# Patient Record
Sex: Male | Born: 1970 | Race: White | Hispanic: No | Smoking: Former smoker
Health system: Southern US, Community
[De-identification: ages and names within clinical notes are randomized; demographics above are authoritative.]

## PROBLEM LIST (undated history)

## (undated) DIAGNOSIS — M549 Dorsalgia, unspecified: Secondary | ICD-10-CM

## (undated) DIAGNOSIS — F32A Depression, unspecified: Secondary | ICD-10-CM

## (undated) DIAGNOSIS — M7660 Achilles tendinitis, unspecified leg: Secondary | ICD-10-CM

## (undated) DIAGNOSIS — F419 Anxiety disorder, unspecified: Secondary | ICD-10-CM

## (undated) DIAGNOSIS — F329 Major depressive disorder, single episode, unspecified: Secondary | ICD-10-CM

## (undated) DIAGNOSIS — S43006A Unspecified dislocation of unspecified shoulder joint, initial encounter: Secondary | ICD-10-CM

## (undated) DIAGNOSIS — E7800 Pure hypercholesterolemia, unspecified: Secondary | ICD-10-CM

## (undated) HISTORY — DX: Major depressive disorder, single episode, unspecified: F32.9

## (undated) HISTORY — PX: HERNIA REPAIR: SHX51

## (undated) HISTORY — PX: TONSILLECTOMY AND ADENOIDECTOMY: SUR1326

## (undated) HISTORY — PX: KNEE ARTHROSCOPY: SUR90

## (undated) HISTORY — DX: Depression, unspecified: F32.A

## (undated) HISTORY — DX: Anxiety disorder, unspecified: F41.9

---

## 1999-11-28 ENCOUNTER — Other Ambulatory Visit: Admission: RE | Admit: 1999-11-28 | Discharge: 1999-12-07 | Payer: Self-pay | Admitting: Psychiatry

## 2000-09-25 ENCOUNTER — Encounter: Payer: Self-pay | Admitting: Emergency Medicine

## 2000-09-25 ENCOUNTER — Emergency Department (HOSPITAL_COMMUNITY): Admission: EM | Admit: 2000-09-25 | Discharge: 2000-09-26 | Payer: Self-pay | Admitting: Emergency Medicine

## 2000-12-22 ENCOUNTER — Emergency Department (HOSPITAL_COMMUNITY): Admission: EM | Admit: 2000-12-22 | Discharge: 2000-12-22 | Payer: Self-pay | Admitting: Emergency Medicine

## 2000-12-22 ENCOUNTER — Encounter: Payer: Self-pay | Admitting: Emergency Medicine

## 2001-01-18 ENCOUNTER — Inpatient Hospital Stay (HOSPITAL_COMMUNITY): Admission: EM | Admit: 2001-01-18 | Discharge: 2001-01-25 | Payer: Self-pay | Admitting: Psychiatry

## 2001-03-07 ENCOUNTER — Inpatient Hospital Stay (HOSPITAL_COMMUNITY): Admission: EM | Admit: 2001-03-07 | Discharge: 2001-03-18 | Payer: Self-pay | Admitting: Psychiatry

## 2001-03-19 ENCOUNTER — Emergency Department (HOSPITAL_COMMUNITY): Admission: EM | Admit: 2001-03-19 | Discharge: 2001-03-19 | Payer: Self-pay | Admitting: *Deleted

## 2001-08-18 ENCOUNTER — Emergency Department (HOSPITAL_COMMUNITY): Admission: EM | Admit: 2001-08-18 | Discharge: 2001-08-18 | Payer: Self-pay | Admitting: *Deleted

## 2001-08-18 ENCOUNTER — Encounter: Payer: Self-pay | Admitting: Emergency Medicine

## 2002-02-13 ENCOUNTER — Emergency Department (HOSPITAL_COMMUNITY): Admission: EM | Admit: 2002-02-13 | Discharge: 2002-02-13 | Payer: Self-pay | Admitting: Emergency Medicine

## 2002-02-16 ENCOUNTER — Emergency Department (HOSPITAL_COMMUNITY): Admission: EM | Admit: 2002-02-16 | Discharge: 2002-02-16 | Payer: Self-pay | Admitting: Emergency Medicine

## 2002-07-19 ENCOUNTER — Inpatient Hospital Stay (HOSPITAL_COMMUNITY): Admission: EM | Admit: 2002-07-19 | Discharge: 2002-07-21 | Payer: Self-pay | Admitting: *Deleted

## 2002-08-23 ENCOUNTER — Emergency Department (HOSPITAL_COMMUNITY): Admission: EM | Admit: 2002-08-23 | Discharge: 2002-08-23 | Payer: Self-pay | Admitting: Emergency Medicine

## 2002-10-25 ENCOUNTER — Inpatient Hospital Stay (HOSPITAL_COMMUNITY): Admission: EM | Admit: 2002-10-25 | Discharge: 2002-10-29 | Payer: Self-pay | Admitting: Psychiatry

## 2002-10-25 ENCOUNTER — Encounter: Payer: Self-pay | Admitting: Emergency Medicine

## 2003-02-11 ENCOUNTER — Encounter: Admission: RE | Admit: 2003-02-11 | Discharge: 2003-02-11 | Payer: Self-pay | Admitting: Psychiatry

## 2003-02-20 ENCOUNTER — Inpatient Hospital Stay (HOSPITAL_COMMUNITY): Admission: EM | Admit: 2003-02-20 | Discharge: 2003-02-23 | Payer: Self-pay | Admitting: Psychiatry

## 2003-03-09 ENCOUNTER — Encounter: Admission: RE | Admit: 2003-03-09 | Discharge: 2003-03-09 | Payer: Self-pay | Admitting: Psychiatry

## 2003-06-28 ENCOUNTER — Emergency Department (HOSPITAL_COMMUNITY): Admission: EM | Admit: 2003-06-28 | Discharge: 2003-06-28 | Payer: Self-pay | Admitting: Emergency Medicine

## 2003-07-05 ENCOUNTER — Emergency Department (HOSPITAL_COMMUNITY): Admission: EM | Admit: 2003-07-05 | Discharge: 2003-07-05 | Payer: Self-pay | Admitting: Emergency Medicine

## 2003-07-09 ENCOUNTER — Emergency Department (HOSPITAL_COMMUNITY): Admission: EM | Admit: 2003-07-09 | Discharge: 2003-07-09 | Payer: Self-pay | Admitting: Emergency Medicine

## 2003-07-23 ENCOUNTER — Encounter: Admission: RE | Admit: 2003-07-23 | Discharge: 2003-07-23 | Payer: Self-pay | Admitting: Specialist

## 2003-07-24 ENCOUNTER — Encounter: Admission: RE | Admit: 2003-07-24 | Discharge: 2003-08-07 | Payer: Self-pay | Admitting: *Deleted

## 2003-08-06 ENCOUNTER — Emergency Department (HOSPITAL_COMMUNITY): Admission: EM | Admit: 2003-08-06 | Discharge: 2003-08-06 | Payer: Self-pay | Admitting: Emergency Medicine

## 2004-02-06 ENCOUNTER — Emergency Department (HOSPITAL_COMMUNITY): Admission: EM | Admit: 2004-02-06 | Discharge: 2004-02-06 | Payer: Self-pay | Admitting: Emergency Medicine

## 2004-03-09 ENCOUNTER — Ambulatory Visit (HOSPITAL_COMMUNITY): Payer: Self-pay | Admitting: Psychiatry

## 2004-05-02 ENCOUNTER — Ambulatory Visit (HOSPITAL_COMMUNITY): Payer: Self-pay | Admitting: Psychiatry

## 2004-08-01 ENCOUNTER — Ambulatory Visit (HOSPITAL_COMMUNITY): Payer: Self-pay | Admitting: Psychiatry

## 2004-08-06 ENCOUNTER — Emergency Department (HOSPITAL_COMMUNITY): Admission: EM | Admit: 2004-08-06 | Discharge: 2004-08-06 | Payer: Self-pay | Admitting: Emergency Medicine

## 2004-11-07 ENCOUNTER — Emergency Department (HOSPITAL_COMMUNITY): Admission: EM | Admit: 2004-11-07 | Discharge: 2004-11-08 | Payer: Self-pay | Admitting: Emergency Medicine

## 2005-01-17 ENCOUNTER — Emergency Department (HOSPITAL_COMMUNITY): Admission: EM | Admit: 2005-01-17 | Discharge: 2005-01-17 | Payer: Self-pay | Admitting: Emergency Medicine

## 2005-03-20 ENCOUNTER — Ambulatory Visit (HOSPITAL_COMMUNITY): Payer: Self-pay | Admitting: Psychiatry

## 2005-08-23 ENCOUNTER — Emergency Department (HOSPITAL_COMMUNITY): Admission: EM | Admit: 2005-08-23 | Discharge: 2005-08-23 | Payer: Self-pay | Admitting: Emergency Medicine

## 2005-11-03 ENCOUNTER — Emergency Department (HOSPITAL_COMMUNITY): Admission: EM | Admit: 2005-11-03 | Discharge: 2005-11-03 | Payer: Self-pay | Admitting: Emergency Medicine

## 2005-11-06 ENCOUNTER — Emergency Department (HOSPITAL_COMMUNITY): Admission: EM | Admit: 2005-11-06 | Discharge: 2005-11-06 | Payer: Self-pay | Admitting: Family Medicine

## 2005-11-07 ENCOUNTER — Emergency Department (HOSPITAL_COMMUNITY): Admission: EM | Admit: 2005-11-07 | Discharge: 2005-11-07 | Payer: Self-pay | Admitting: Emergency Medicine

## 2005-11-08 ENCOUNTER — Ambulatory Visit (HOSPITAL_COMMUNITY): Payer: Self-pay | Admitting: Psychiatry

## 2006-03-29 ENCOUNTER — Emergency Department (HOSPITAL_COMMUNITY): Admission: EM | Admit: 2006-03-29 | Discharge: 2006-03-29 | Payer: Self-pay | Admitting: Emergency Medicine

## 2006-10-04 ENCOUNTER — Emergency Department (HOSPITAL_COMMUNITY): Admission: EM | Admit: 2006-10-04 | Discharge: 2006-10-04 | Payer: Self-pay | Admitting: Emergency Medicine

## 2006-10-05 ENCOUNTER — Emergency Department (HOSPITAL_COMMUNITY): Admission: EM | Admit: 2006-10-05 | Discharge: 2006-10-05 | Payer: Self-pay | Admitting: Emergency Medicine

## 2007-02-09 ENCOUNTER — Emergency Department (HOSPITAL_COMMUNITY): Admission: EM | Admit: 2007-02-09 | Discharge: 2007-02-09 | Payer: Self-pay | Admitting: Emergency Medicine

## 2007-06-20 ENCOUNTER — Emergency Department (HOSPITAL_COMMUNITY): Admission: EM | Admit: 2007-06-20 | Discharge: 2007-06-20 | Payer: Self-pay | Admitting: Emergency Medicine

## 2007-07-01 ENCOUNTER — Ambulatory Visit (HOSPITAL_COMMUNITY): Admission: RE | Admit: 2007-07-01 | Discharge: 2007-07-01 | Payer: Self-pay | Admitting: *Deleted

## 2007-07-31 ENCOUNTER — Ambulatory Visit (HOSPITAL_BASED_OUTPATIENT_CLINIC_OR_DEPARTMENT_OTHER): Admission: RE | Admit: 2007-07-31 | Discharge: 2007-07-31 | Payer: Self-pay | Admitting: Orthopedic Surgery

## 2007-08-15 ENCOUNTER — Encounter: Admission: RE | Admit: 2007-08-15 | Discharge: 2007-09-24 | Payer: Self-pay | Admitting: Orthopedic Surgery

## 2007-08-19 ENCOUNTER — Emergency Department (HOSPITAL_COMMUNITY): Admission: EM | Admit: 2007-08-19 | Discharge: 2007-08-19 | Payer: Self-pay | Admitting: Emergency Medicine

## 2007-08-28 ENCOUNTER — Emergency Department (HOSPITAL_COMMUNITY): Admission: EM | Admit: 2007-08-28 | Discharge: 2007-08-28 | Payer: Self-pay | Admitting: Emergency Medicine

## 2007-10-19 ENCOUNTER — Emergency Department (HOSPITAL_COMMUNITY): Admission: EM | Admit: 2007-10-19 | Discharge: 2007-10-19 | Payer: Self-pay | Admitting: Emergency Medicine

## 2007-10-25 ENCOUNTER — Emergency Department (HOSPITAL_COMMUNITY): Admission: EM | Admit: 2007-10-25 | Discharge: 2007-10-25 | Payer: Self-pay | Admitting: Emergency Medicine

## 2007-10-31 ENCOUNTER — Emergency Department (HOSPITAL_COMMUNITY): Admission: EM | Admit: 2007-10-31 | Discharge: 2007-10-31 | Payer: Self-pay | Admitting: Emergency Medicine

## 2007-11-02 ENCOUNTER — Emergency Department (HOSPITAL_COMMUNITY): Admission: EM | Admit: 2007-11-02 | Discharge: 2007-11-02 | Payer: Self-pay | Admitting: Emergency Medicine

## 2007-11-06 ENCOUNTER — Ambulatory Visit: Payer: Self-pay | Admitting: Family Medicine

## 2007-11-25 ENCOUNTER — Encounter
Admission: RE | Admit: 2007-11-25 | Discharge: 2007-11-26 | Payer: Self-pay | Admitting: Physical Medicine & Rehabilitation

## 2007-11-26 ENCOUNTER — Ambulatory Visit: Payer: Self-pay | Admitting: Physical Medicine & Rehabilitation

## 2008-03-03 ENCOUNTER — Emergency Department (HOSPITAL_COMMUNITY): Admission: EM | Admit: 2008-03-03 | Discharge: 2008-03-03 | Payer: Self-pay | Admitting: Emergency Medicine

## 2008-05-03 ENCOUNTER — Emergency Department (HOSPITAL_COMMUNITY): Admission: EM | Admit: 2008-05-03 | Discharge: 2008-05-03 | Payer: Self-pay | Admitting: Emergency Medicine

## 2008-05-28 ENCOUNTER — Emergency Department (HOSPITAL_COMMUNITY): Admission: EM | Admit: 2008-05-28 | Discharge: 2008-05-28 | Payer: Self-pay | Admitting: Emergency Medicine

## 2008-06-23 ENCOUNTER — Emergency Department (HOSPITAL_COMMUNITY): Admission: EM | Admit: 2008-06-23 | Discharge: 2008-06-23 | Payer: Self-pay | Admitting: Emergency Medicine

## 2009-01-07 ENCOUNTER — Emergency Department (HOSPITAL_COMMUNITY): Admission: EM | Admit: 2009-01-07 | Discharge: 2009-01-07 | Payer: Self-pay | Admitting: Emergency Medicine

## 2009-11-16 ENCOUNTER — Emergency Department (HOSPITAL_COMMUNITY): Admission: EM | Admit: 2009-11-16 | Discharge: 2009-11-16 | Payer: Self-pay | Admitting: Emergency Medicine

## 2010-02-14 ENCOUNTER — Emergency Department (HOSPITAL_COMMUNITY): Admission: EM | Admit: 2010-02-14 | Discharge: 2010-02-14 | Payer: Self-pay | Admitting: Emergency Medicine

## 2010-04-05 ENCOUNTER — Emergency Department (HOSPITAL_COMMUNITY): Admission: EM | Admit: 2010-04-05 | Discharge: 2010-04-05 | Payer: Self-pay | Admitting: Emergency Medicine

## 2010-07-20 ENCOUNTER — Emergency Department (HOSPITAL_COMMUNITY)
Admission: EM | Admit: 2010-07-20 | Discharge: 2010-07-20 | Payer: Self-pay | Source: Home / Self Care | Admitting: Emergency Medicine

## 2010-09-08 LAB — DIFFERENTIAL
Basophils Absolute: 0 10*3/uL (ref 0.0–0.1)
Basophils Relative: 0 % (ref 0–1)
Eosinophils Absolute: 0.1 10*3/uL (ref 0.0–0.7)
Eosinophils Relative: 1 % (ref 0–5)
Lymphocytes Relative: 22 % (ref 12–46)
Lymphs Abs: 2.1 10*3/uL (ref 0.7–4.0)
Monocytes Absolute: 0.6 10*3/uL (ref 0.1–1.0)
Monocytes Relative: 6 % (ref 3–12)
Neutro Abs: 6.8 10*3/uL (ref 1.7–7.7)
Neutrophils Relative %: 71 % (ref 43–77)

## 2010-09-08 LAB — COMPREHENSIVE METABOLIC PANEL
Albumin: 4.5 g/dL (ref 3.5–5.2)
Alkaline Phosphatase: 61 U/L (ref 39–117)
BUN: 11 mg/dL (ref 6–23)
CO2: 28 mEq/L (ref 19–32)
Chloride: 109 mEq/L (ref 96–112)
Creatinine, Ser: 0.96 mg/dL (ref 0.4–1.5)
GFR calc non Af Amer: >60 mL/min (ref >60)
Glucose, Bld: 107 mg/dL — ABNORMAL HIGH (ref 70–99)
Total Bilirubin: 0.8 mg/dL (ref 0.3–1.2)

## 2010-09-08 LAB — CBC
HCT: 46.7 % (ref 39.0–52.0)
Hemoglobin: 16.1 g/dL (ref 13.0–17.0)
MCH: 32.8 pg (ref 26.0–34.0)
MCHC: 34.5 g/dL (ref 30.0–36.0)
MCV: 95 fL (ref 78.0–100.0)
Platelets: 256 10*3/uL (ref 150–400)
RBC: 4.91 MIL/uL (ref 4.22–5.81)
RDW: 13.5 % (ref 11.5–15.5)
WBC: 9.6 10*3/uL (ref 4.0–10.5)

## 2010-09-08 LAB — URINALYSIS, ROUTINE W REFLEX MICROSCOPIC
Bilirubin Urine: NEGATIVE
Glucose, UA: NEGATIVE mg/dL
Hgb urine dipstick: NEGATIVE
Protein, ur: NEGATIVE mg/dL
Specific Gravity, Urine: 1.021 (ref 1.005–1.030)
Urobilinogen, UA: 0.2 mg/dL (ref 0.0–1.0)

## 2010-09-08 LAB — LIPASE, BLOOD: Lipase: 22 U/L (ref 11–59)

## 2010-10-12 ENCOUNTER — Emergency Department (HOSPITAL_COMMUNITY): Payer: Medicaid Other

## 2010-10-12 ENCOUNTER — Emergency Department (HOSPITAL_COMMUNITY)
Admission: EM | Admit: 2010-10-12 | Discharge: 2010-10-12 | Disposition: A | Discharge status: Home / Self Care | Payer: Medicaid Other | Attending: Emergency Medicine | Admitting: Emergency Medicine

## 2010-10-12 DIAGNOSIS — M546 Pain in thoracic spine: Secondary | ICD-10-CM | POA: Insufficient documentation

## 2010-10-12 DIAGNOSIS — IMO0001 Reserved for inherently not codable concepts without codable children: Secondary | ICD-10-CM | POA: Insufficient documentation

## 2010-10-12 DIAGNOSIS — M25519 Pain in unspecified shoulder: Secondary | ICD-10-CM | POA: Insufficient documentation

## 2010-11-08 NOTE — Group Therapy Note (Signed)
REASON FOR EVALUATION:  Right knee pain.  Has postoperative pain.   Date of surgery was July 31, 2007, per Dr. Turner Daniels.  He had  chondromalacia, lateral femoral condyle as well as complex anterior horn  medial meniscal tear, status post debridement.  He had no postoperative  complications; however, continues to have complaints of knee locking in  an extended position.  He can flex to at least 90.  He complains of pain  now that he is no longer taking narcotic analgesics.  He received  Percocet initially postoperatively, switched to Vicodin.  He went to a  couple of physical therapy sessions but discontinued.  He indicates some  financial concerns regarding this.  The patient is concerned that the  weightbearing surface is no longer covered by cartilage.  He has not  done any kind of exercise other than trying to do some straight leg  raising.  He states that he went back to work for a time when he was on  medications but has not worked since being out of his narcotic analgesic  medicines over the last two weeks.  He did go to [employer] and  received tramadol through their office.  He reports going to the ED on  one occasion for postoperative pain, but they stated they could not see  him on an ongoing basis.   Reviewing MRI reports showed that some lateral patellar subluxation was  reported, some edema in the lateral aspect of the trochlea, but patellar  cartilage intact.  No tear in the medial patellar retinaculum.   He does complain of the knee popping out at times, but he cannot really  describe or show me what that exactly means.   SOCIAL HISTORY:  Divorced.  Lives with his mother.  Social alcohol.  Smokes a pack a day.  Does not admit to any type of illegal drugs.  In  looking at his E-chart, multiple ED visits for right knee pain.  Recently some oxycodone in April, on May 1.   His functional status is independent.  He states that he has not worked,  though, in four  weeks.   PHYSICAL EXAMINATION:  Blood pressure 131/68, pulse 73, respiratory rate  18, O2 sat 97% on room air.  Orientation x3.  Affect is irritable.  Gait is normal.  He has normal  hip exam, normal ankle exam, normal left knee exam with the exception of  some crepitus at the patella on the left.  On the right knee, very  difficult to examine, guarding.  Does not want me to touch his knee.  Even checking his deep tendon reflexes, he jumped and he states that it  hurt.  He has no evidence of effusion, no evidence of erythema.  No  joint line tenderness.  No tenderness around the patella.  He has full  extension.  Flexion, he gets to at least 90, but he would not let me  check him beyond this point.  He does have good medial lateral  stability.   IMPRESSION:  Chronic postoperative pain, status post meniscectomy:  I  did take out the knee model and explained what was resected and  indicated that even 20 years ago when people did total meniscectomies,  patients did not have immediate postoperative pain that persisted, and  in fact, patient had only a partial debridement.  I indicated the need  for strengthening the leg and have asked him to use the stationary  bicycle.   In regards  to his knee locking or his knee popping out, I questioned  whether he may have some patellar subluxation.  He does have reportedly  shallow trochlea.  He may benefit from orthopedic re-evaluation in  regards to this.   I do not see why he cannot go to work, although he indicates that he can  only work if he had narcotic analgesics.  We will check urine drug  screen on him.  Do not think long-term narcotics would really be  indicated but perhaps short term to get at least into some physical  therapy would be helpful.  I do question his motivation, as he states  that he does not really feel like he needs therapy and his knee just  needs to be fixed right.   Thank you for this interesting  consultation.      Erick Colace, M.D.  Electronically Signed     AEK/MedQ  D:  11/26/2007 15:53:56  T:  11/26/2007 16:17:04  Job #:  [id number]

## 2010-11-08 NOTE — Op Note (Signed)
Kevin Hall, Kevin Hall               ACCOUNT NO.:  [id number]   MEDICAL RECORD NO.:  [id number]          PATIENT TYPE:  AMB   LOCATION:  DSC                          FACILITY:  [facility]   PHYSICIAN:  Feliberto Gottron. Turner Daniels, M.D.   DATE OF BIRTH:  1970-09-17   DATE OF PROCEDURE:  07/31/2007  DATE OF DISCHARGE:                               OPERATIVE REPORT   PREOPERATIVE DIAGNOSIS:  Right knee lateral meniscal tear, anterior  horn, and possible osteochondral defect of lateral femoral condyle.   POSTOPERATIVE DIAGNOSIS:  Right knee lateral meniscal tear, anterior  horn, and possible osteochondral defect of lateral femoral condyle.   PROCEDURE:  Right knee arthroscopic partial lateral meniscectomy,  anterior horn, and removal of grade 4 flap tear, lateral femoral condyle  anterior to anterodistal on the lateral side.  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   SURGEON:  Feliberto Gottron. Turner Daniels, M.D.   FIRST ASSISTANT:  Skip Mayer, P.A.-C.   ANESTHESIA:  General LMA.   ESTIMATED BLOOD LOSS:  Minimal.   FLUID REPLACEMENT:  A liter of crystalloid.   DRAINS PLACED:  None.   TOURNIQUET TIME:  None.   INDICATIONS FOR PROCEDURE:  This is a 40 year old man who fell off of a  ladder and injured his right knee back around Christmastime.  Has had  persistent pain, catching and popping in his right knee since the injury  and desires elective arthroscopic evaluation and treatment of his knee.  An MRI scan showed an anterior horn lateral meniscal tear and a possible  osteochondral defect, lateral femoral condyle.  Risks and benefits of  surgery discussed, questions answered.   DESCRIPTION OF PROCEDURE:  The patient identified by armband, taken to  the operating room at [facility].  Appropriate anesthetic  monitors were attached and general LMA anesthesia induced with the  patient in supine  position.  Lateral post applied to the table and the  right lower extremity prepped and draped in the usual sterile fashion  from the ankle to the midthigh.   Using a #11 blade, standard inferomedial and inferolateral peripatellar  portals were then made allowing introduction of the arthroscope through  the inferolateral portal and the outflow through the inferomedial  portal.  Diagnostic arthroscopy revealed what appeared to be some  distant chondromalacia of the medial facet of the patella along the  medial edge consistent with a lateral subluxation of the patella,  although it was not a fresh injury.  The age looked to indeterminate  since it had already healed over with some scar cartilage.  The trochlea  was in relatively good condition as was the medial compartment,  articular and meniscal cartilages.  The cruciate ligaments were intact,  although there was a slight amount of fraying of the ACL that was  lightly debrided.  The anterior horn of the lateral meniscus was in fact  torn, with some complex tearing, and this was debrided back to a stable  margin using a 3.5 gator sucker  shaver.  There was some grade 3  chondromalacia of the lateral tibial plateau.  This was also debrided.  We then directed our attention to the far lateral aspect of the lateral  femoral condyle where there was delamination of the cartilage from the  lateral femoral condyle at the junction of the lateral femoral condyle  and the lateral wall of the femur.  This was about a quarter inch where  was it flap torn and delaminated.  This was debrided back to a stable  margin using a 3.5 gator sucker shaver as well as a 4.2 barracuda sucker  shaver.  Once we got back to a stable margin, we then evaluated the  gutters medially and laterally and cleared the posterior horns of the  menisci by going through the notch.  The knee was then irrigated out  with normal saline solution.  The arthroscopic instruments were  removed,  and a dressing of Xerofoam, 4 x 4 dressing, sponges, Webril, and Ace  wrap were applied.   The patient was then awakened and taken to the recovery room without  difficulty.      Feliberto Gottron. Turner Daniels, M.D.  Electronically Signed     FJR/MEDQ  D:  07/31/2007  T:  08/01/2007  Job:  [id number]

## 2010-11-08 NOTE — Group Therapy Note (Signed)
ADDENDUM:  Review of electronic medical records reveals multiple  psychiatric hospitalizations from 2002 to 2004.  There were four  episodes with diagnosis of bipolar disorder, manic, rule out schizo-  affective disorder.  He was treated with Risperdal at one point.  Also  noted to have alcohol abuse and marijuana abuse at that time and poor  compliance with lithium.   This makes him a poor candidate for long-term narcotic analgesic  medication.      Erick Colace, M.D.  Electronically Signed     AEK/MedQ  D:  11/26/2007 16:08:31  T:  11/26/2007 16:54:39  Job #:  [id number]   cc:   Feliberto Gottron. Turner Daniels, M.D.  Fax: [phone]

## 2010-11-11 NOTE — Discharge Summary (Signed)
[facility]  Patient:    Kevin Hall, Kevin Hall Visit Number: [id number] MRN: [id number]          Service Type: PSY Location: 50 0503 01 Attending Physician:  Samule Dry Dictated by:   Adelene Amas. Williford, M.D. Admit Date:  03/07/2001 Discharge Date: 03/18/2001                             Discharge Summary  GENERAL MEDICAL FINDINGS:  The patients lithium level on 20 September was 0.97.  His basic metabolic panel was unremarkable.  The patient had some initial subjective report of slight rigidity from the Risperdal.  Cogentin was available p.r.n.; however, the patient reported no rigidity; there was no evidence neurologically in subsequent days, and the Cogentin was not needed.  HOSPITAL COURSE:  Kevin Hall was admitted to the inpatient psychiatric unit on commitment, and when he became cooperative, began participating in milieu and group psychotherapy as well as one-to-one ego supportive psychotherapy.  By September 15, he began voluntarily taking his medication including lithium 900 mg q.h.s., Risperdal 1 mg q.h.s.  By September 22, [name] had slept 7 hours restful sleep.  His mood was stable.  His nurses were noting that he had been cooperative and socially appropriate.  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  His appetite and energy were within normal limits.  He was not experiencing any adverse medication effects.  He was not having any paranoia or other delusions.  His racing thoughts had resolved.  CONDITION UPON DISCHARGE:  By September 23, Kevin Hall was continuing to be cooperative and socially appropriate with staff and other patients.  His mood, interests, energy, concentration, and appetite were all within normal limits. He was not experiencing any abnormal voluntary movements or other adverse medication effects.  He was not having any racing thoughts, hallucinations, or delusions.  The patient had made  arrangements to stay in a hotel.  His family had stated that he could not return home to live with them.  The patient stated that he did not want the undersigned or anyone from the hospital to contact his family.  The patient stated that he would stay in a hotel for a few days until he could make other arrangements.  He was also aware of the [facility] as an option for him.  Vital signs upon discharge: Temperature 97.1, pulse 99, respirations 16, blood pressure sitting 125/65, standing 135/64.  Weight had improved from 124 pounds on September 12 to 134 pounds on the day of discharge.  MENTAL STATUS EXAMINATION UPON DISCHARGE:  Kevin Hall is alert.  He is oriented to all spheres.  His speech is within normal limits.  Thought process is logical, coherent, goal directed, no looseness of association in thought content, no thoughts of harming himself, no thoughts of harming others, no delusions, no hallucinations.  Memory within normal limits.  Concentration within normal limits.  Affect broad, appropriate.  Judgment within normal limits.  DISCHARGE DIAGNOSES: AXIS I.   Bipolar I disorder in early clinical remission. AXIS II.  Deferred. AXIS III. History of right inguinal hernia repair with no residual           sequelae. AXIS IV.  Occupational primary support group. AXIS V.   60.  DISCHARGE PLAN:  Kevin Hall agrees to not drive if drowsy and to call for thoughts of harming himself, thoughts of harming others, distress, racing  thoughts.  DISCHARGE MEDICATIONS: 1. Risperdal 1 mg 1 p.o. q.h.s., dispense #15. 2. Lithobid 300 mg 3 p.o. q.h.s., dispense #30.  The patient was given directions to the [facility] with the address and the phone number.  He has an appointment with the undersigned for psychiatric followup at 11:45 on September 24. Dictated by:   Adelene Amas. Williford, M.D. Attending Physician:  Samule Dry DD:  03/18/01 TD:  03/18/01 Job:  [id number]

## 2010-11-11 NOTE — H&P (Signed)
NAME:  Kevin Hall, Kevin Hall                         ACCOUNT NO.:  [id number]   MEDICAL RECORD NO.:  [id number]                   PATIENT TYPE:  OBV   LOCATION:  0505                                 [facility]ILITY:  [facility]   PHYSICIAN:  Kevin Hall, M.D.                   DATE OF BIRTH:  December 05, 1970   DATE OF ADMISSION:  07/18/2002  DATE OF DISCHARGE:                         PSYCHIATRIC ADMISSION ASSESSMENT   PATIENT IDENTIFICATION:  This is a 40 year old white male who is divorced  with no children.  He is voluntarily admitted.   HISTORY OF PRESENT ILLNESS:  The patient walked into the assessment area of  [facility] reporting feeling depressed.  He acknowledged being  treated as an outpatient with Kevin Hall, M.D. for bipolar disorder.  He acknowledged having gone of his medications 7-10 days ago so that he  could drink more.  The patient appeared to be somewhat manic and somewhat  confused.  He also appeared intoxicated with tangential thoughts.  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  He was  requesting admission.  Although he specifically denied suicidal ideation,  the patient said he cut his hand and wrist.  Today, after getting some  sleep, he reported that he accidentally cut his hand paring a tomato.   PAST PSYCHIATRIC HISTORY:  He acknowledges two prior admissions here at  [facility] and a one day admission to [facility].   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  As he has been playing music every day, he  has been drinking beer every day.   PAST MEDICAL HISTORY:  Primary care Kevin Hall: He does not have a primary  care Kevin Hall.  He does acknowledge a recent physical [facility]  [address] prior to his departure from his employment at [employer].   MEDICATIONS:  He states he is only taking lithium 900 mg at hour of  sleep,  that he and Kevin Hall, M.D., allowed him to discontinue Risperdal a  while ago.  He has an upcoming appointment on July 22, 2002.   DRUG ALLERGIES:  None.   REVIEW OF SYSTEMS:  His review of systems was unremarkable.   PHYSICAL EXAMINATION:  GENERAL:  Positive physical findings were none.  He  is a healthy, well developed, well nourished white male who appears his  stated age of 32.   SOCIAL HISTORY:  He finished the 11th grade.  He has worked the past four to  five years at [employer], although he was made to quit a few weeks ago,  according to him.   FAMILY HISTORY:  His brother, age 79, also takes lithium for bipolar  disorder.  His mother is depressed.  He is unaware of what medication she  takes.   MENTAL STATUS EXAM:  Mental status exam revealed a healthy, alert, young  man.  His appearance and behavior were notable for poor eye contact as well  as impulsive and hyper movements.  His speech was rapid, almost pressured.  His mood was irritable, although he called this being anxious.  He did not  appear to read social cues correctly.  His thought process was logical and  goal directed.  His cognition was intact.   ADMISSION DIAGNOSES:   AXIS I:  Bipolar, hypomanic.   AXIS II:  Deferred.   AXIS III:  No known medical problems.   AXIS IV:  He has multiple problems, problems with primary support group,  problems related to social environment, occupational problems, housing  problems, and economic problems.   AXIS V:  He is currently 29.  In the past year, he was probably 65 or 69.   INITIAL PLAN OF CARE:  The plan is to convert his 23-hour observation to a  full admission, to restart his medications include Risperdal, and to have a  family session to help clarify whether the patient should try to work as he  is actively applying for disability to think about Strattera to help  inattention and hyperimpulsive symptoms.   ESTIMATED LENGTH OF STAY:  Tentative  length of stay is two to three days.     Kevin Hall, P.A.-C.              Kevin Hall, M.D.    MA/MEDQ  D:  07/19/2002  T:  07/19/2002  Job:  [id number]
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
## 2010-11-11 NOTE — Discharge Summary (Signed)
NAME:  Kevin Hall, Kevin Hall                         ACCOUNT NO.:  [id number]   MEDICAL RECORD NO.:  [id number]                   PATIENT TYPE:  IPS   LOCATION:  0508                                 [facility]ILITY[facility]   PHYSICIAN:  Carolanne Grumbling, M.D.                 DATE OF BIRTH:  1971/01/05   DATE OF ADMISSION:  02/20/2003  DATE OF DISCHARGE:  02/23/2003                                 DISCHARGE SUMMARY   HISTORY OF PRESENT ILLNESS:  [name] was a 40 year old man who was  admitted to the hospital  after having a history of depression with  increased  anxiety. He had had trouble sleeping. He has a history of bipolar  disorder and had stopped taking his  lithium for about 3 months prior to  admission. He had changed doctors and his new doctor started him on a new  medication but he stopped that medication on his own. He had been reportedly  drinking  to control some of his symptoms. He has had several admissions to  this center before. He also admitted to smoking marijuana.   Mental status at the time of the initial evaluation  revealed  an alert and  oriented man who was hyperverbal, loud, angry, upset, irritable. He was  focused on getting his Concerta and his medications immediately. Otherwise  cognitive functioning was intact. The judgment and insight were poor.   ADMISSION DIAGNOSES:   AXIS I:  1. Bipolar disorder mixed.  2. Alcohol abuse.   AXIS II:  Deferred.   AXIS III:  No diagnosis.   AXIS IV:  Preoccupation with psychosocial problems.   AXIS V:  30/55.   PHYSICAL EXAMINATION:  Noncontributory.   LABORATORY DATA:  Findings of all indicated laboratory examinations were  within normal limits except for a slightly elevated blood sugar at 155, the  presence of marijuana in his urine drug screen and his lithium level was  less than 0.25.   HOSPITAL COURSE:  While in the hospital he mainly was irritable. He wanted  to take the medications the way he wanted to take them  and when he  wanted  to take them. He was impatient to follow through with an observation. He did  take the lithium which he has taken in the past. By the time I saw him on  the day of discharge  he was ready to be released, saying that he would only  get worse in the hospital. He would to better  at home.   I talked to his mother. She was willing to have him home, although she was  uneasy about it because he stopped his medications so many times in the  past. He indicated that this time he realized he needed to take the lithium  and he would not stop. At no point during any of the hospital stay was he  suicidal or making  threats towards others and consequently he was  discharged.   DISCHARGE MEDICATIONS:  1. Lithium carbonate 450 mg b.i.d.  2. Risperdal 0.5 mg b.i.d.  3. Clonazepam 0.5 mg b.i.d. and 1 mg q. h.s.  4. Concerta 54 mg daily.   DISCHARGE INSTRUCTIONS:  There were no restrictions placed on his activity  or diet. He was to have  a lithium level  drawn on March 03, 2003.   FOLLOW UP:  He was to follow up with Dr. Mariana Single at the outpatient clinic  at this hospital.                                               Carolanne Grumbling, M.D.    GT/MEDQ  D:  03/09/2003  T:  03/10/2003  Job:  [id number]

## 2010-11-11 NOTE — Discharge Summary (Signed)
NAME:  Kevin Hall, Kevin Hall                         ACCOUNT NO.:  [id number]   MEDICAL RECORD NO.:  [id number]                   PATIENT TYPE:  IPS   LOCATION:  0301                                 [facility]ACILITY[facility]  [facility]   PHYSICIAN:  Geoffery Lyons, M.D.                   DATE OF BIRTH:  03/25/1971   DATE OF ADMISSION:  10/25/2002  DATE OF DISCHARGE:  10/29/2002                                 DISCHARGE SUMMARY   CHIEF COMPLAINT AND PRESENT ILLNESS:  This was the fourth admission to [facility] for this male who presented with the complaint of  thoughts that he wanted to strangle himself with his necklace.  History of  attempting to strangle himself.  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  He  was prescribed Klonopin, unclear whether he was actually abusing the  Klonopin as well.  Also acknowledged that he smokes marijuana.  Complained  of abdominal pain.   PAST PSYCHIATRIC HISTORY:  First time at [facility].  He  had been at [facility] and also sees Dr. Elam Dutch on an  outpatient basis.  Prescribed Eskalith 900 mg daily and Klonopin 1 mg twice  a day.   ALCOHOL/DRUG HISTORY:  Occasional use of marijuana.  He does endorse the use  of alcohol.   PAST MEDICAL HISTORY:  Noncontributory.   PHYSICAL EXAMINATION:  Performed and failed to show any acute findings.   MENTAL STATUS EXAM:  Alert, cooperative male.  Anxious, irritable.  Affect  is somewhat expansive, cooperative, oriented.  Good eye contact.  Speech is  rapid, pressured, tight association, oriented to person, place and time.  Increased psychomotor presentation.  Thoughts are rational, coherent.  No  delusions.  No auditory or visual hallucinations.  Cognition well-preserved.   ADMISSION DIAGNOSES:   AXIS I:  1. Bipolar disorder, hypomanic.  2. Alcohol and marijuana abuse.   AXIS II:  No diagnosis.   AXIS III:  No diagnosis.   AXIS IV:  Moderate.   AXIS V:  Global Assessment of Functioning upon admission 35; highest Global  Assessment of Functioning in the last year 60-65.   LABORATORY DATA:  CBC within normal limits.  Blood chemistries within normal  limits.  Thyroid profile within normal limits.   HOSPITAL COURSE:  He was admitted and started intensive individual and group  psychotherapy.  He did admit he stopped his lithium.  He was having pain.  Did admit he was under a lot of stress.  Feeling irritable, angry, agitated.  Did feel that Risperdal helped in the past.  He continued to have some  somatic complaints.  Evidencing restlessness, pressured speech but started  to sleep better.  Lithium was reinstituted.  Lithium level was 0.62 and it  was increased to 225 mg daily.  By Oct 29, 2002, he was much improved.  He  was tolerating medications well.  He was on Risperdal 0.5 mg twice a day.  Eskalith CR 450 mg in the morning and 675 mg in the afternoon.  Klonopin 1  mg twice a day.  On Oct 29, 2002, he was much improved.  Slept through the  night.  Mood starting to become euthymic.  Affect was bright, broad.  Not  expansive.  Marked decrease in pressured speech.  Increased insight.  No  suicidal ideation.  No homicidal ideation.  No paranoia.  Family session  with the father went well.  So we went ahead and discharged to outpatient  follow-up.   DISCHARGE DIAGNOSES:   AXIS I:  1. Bipolar disorder, hypomanic.  2. Alcohol and marijuana abuse.   AXIS II:  No diagnosis.   AXIS III:  No diagnosis.   AXIS IV:  Moderate.   AXIS V:  Global Assessment of Functioning upon discharge 50.   DISCHARGE MEDICATIONS:  1. Protonix 40 mg per day.  2. Colace 100 mg twice a day.  3. Eskalith CR 450 mg, 1 in the morning and 1-1/2 at night.  4. Risperdal 0.5 mg twice a day.  5. Klonopin 1 mg twice a day.   FOLLOW UP:  Going to see Dr. Jeanie Sewer for follow-up.                                                Geoffery Lyons, M.D.    IL/MEDQ  D:  11/26/2002  T:  11/27/2002  Job:  [id number]
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
## 2010-11-11 NOTE — H&P (Signed)
NAME:  Kevin Hall, Kevin Hall                         ACCOUNT NO.:  [id number]   MEDICAL RECORD NO.:  [id number]                   PATIENT TYPE:  IPS   LOCATION:  0508                                 FACILITY[facility]  [facility]   PHYSICIAN:  Geoffery Lyons, M.D.                   DATE OF BIRTH:  September 20, 1970   DATE OF ADMISSION:  02/20/2003  DATE OF DISCHARGE:  02/23/2003                         PSYCHIATRIC ADMISSION ASSESSMENT   IDENTIFYING INFORMATION:  The patient is a 40 year old divorced white male  voluntarily admitted on February 20, 2003.   HISTORY OF PRESENT ILLNESS:  The patient presents with a history of  depression and having increased anxiety.  He reports he has been having  difficulty sleeping.  He has been off his medication, his lithium, for some  time because he states he has been working out in the yard and he knew he  was not supposed to be taking his lithium remaining outside in the sun for  some time.  He was started on a new mood stabilizer but states when he read  up on it and realized it was for seizures, he decided not to take it.  He  reports he has begun drinking some to control some of his symptoms but  denies that this is a problem.  He denies any psychotic symptoms.   PAST PSYCHIATRIC HISTORY:  He has had several admissions to [facility].  He was here in May 2004 and [place] for one day.  He was  seeing [name], M.D., and then Hipolito Bayley, M.D., and then did  see Dr. Marlyne Beards briefly and was started on Trileptal at that time.   SUBSTANCE ABUSE HISTORY:  The patient smokes cigarettes.  He uses alcohol on  occasion, denies it is a problem.  He smokes marijuana.   PAST MEDICAL HISTORY:  Primary care Anisha Starliper: Unknown.  Medical problems:  None.   MEDICATIONS:  1. He has been on lithium 450 mg in the morning, 600 mg in the evening.  2. Klonopin, taking 2 mg daily.  3. Concerta 54 mg q.a.m.  4. Risperdal 0.5 mg b.i.d.   He has been noncompliant with  his lithium and Risperdal.   DRUG ALLERGIES:  No known allergies.   REVIEW OF SYSTEMS:  No cardiac or pulmonary problems.  The patient smokes  cigarettes.  No GI or GU.  Not sexually active at this time.  He complains  of some lower back pain that he relates to stress.   PHYSICAL EXAMINATION:  GENERAL:  The patient is a young, well nourished male  voicing no complaints.  VITAL SIGNS:  Temperature 97.9, heart rate 72, respiratory rate 16, blood  pressure 132/81.  He is 5 feet 8 inches tall.  He weighs 146 pounds.  HEENT:  Hair is short, clean, and evenly distributed.  Head is  normocephalic, atraumatic.  NECK:  Negative lymphadenopathy.  CHEST:  Clear to auscultation.  HEART:  Regular rate and rhythm.  ABDOMEN:  Soft, nontender abdomen.  MUSCULOSKELETAL:  Muscle strength and tone is equal bilaterally.  No CVA  tenderness, no spinal tenderness.  NEUROLOGIC:  He is able to performed heel-to-shin and normal alternating  movements without any difficulty.   LABORATORY DATA:  CBC is within normal limits.  Glucose is 157.  Urinalysis  is negative.   SOCIAL HISTORY:  He is a 40 year old divorced white male, no children.  He  lives with his parents for three years.  He states he is trying to get  disability.   FAMILY HISTORY:  Family history is unknown.   MENTAL STATUS EXAM:  He is an alert, well nourished male, cooperative.  Speech is hyperverbal, loud.  Mood is angry and upset.  The patient is also  very irritable.  Thought processes: The patient is focused on getting his  Concerta and his medications immediately.  Cognitive functioning is intact.  Judgment and insight are poor.   ADMISSION DIAGNOSES:   AXIS I:  1. Bipolar disorder, mixed.  2. Alcohol abuse.   AXIS II:  Deferred.   AXIS III:  None.   AXIS IV:  Problems with occupation and other psychosocial problems.   AXIS V:  Current is 30, this past year is 55-60.   INITIAL PLAN OF CARE:  Plan is a voluntary admission  to [facility] for hypomanic symptoms.  Check every 15 minutes.  Stabilize mood and  thinking so the patient can be safe and functional.  Will resume his  lithium, Risperdal, and Klonopin.  Check therapeutic levels on lithium.  Will also have Librium available on an every six hour basis for withdrawal  symptoms.  Medication compliance was discussed.  The patient is to remain  medication compliant, to remain alcohol and drug free.  The patient is to  follow up with mental health and return to programming at [facility].   ESTIMATED LENGTH OF STAY:  Four to five days.     Landry Corporal, N.P.                       Geoffery Lyons, M.D.    JO/MEDQ  D:  02/23/2003  T:  02/23/2003  Job:  [id number]

## 2010-11-11 NOTE — Discharge Summary (Signed)
NAME:  Kevin Hall, Kevin Hall                         ACCOUNT NO.:  [id number]   MEDICAL RECORD NO.:  [id number]                   PATIENT TYPE:  IPS   LOCATION:  0505                                 FACILITY:  [facility]   PHYSICIAN:  Geoffery Lyons, M.D.                   DATE OF BIRTH:  Mar 04, 1971   DATE OF ADMISSION:  07/18/2002  DATE OF DISCHARGE:  07/21/2002                                 DISCHARGE SUMMARY   CHIEF COMPLAINT AND PRESENTING ILLNESS:  This was the third admission  to  [facility]  for this 40 year old white male, divorced, no  children, voluntarily admitted.  He came into the [facility]  reporting feeling depressed, being on an outpatient basis by Dr. Jeanie Sewer  for bipolar disorder.  Off medications 7-10 days so he could drink more.  Appears somewhat manic, confused, intoxicated, panicky thoughts.  He said he  was there because of ruminations were driving him crazy.  Requesting  admission.   PAST PSYCHIATRIC HISTORY:  Two prior admissions to [facility] and one at  [facility].   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   PAST MEDICAL HISTORY:  Denies history of any major medical conditions.   MEDICATIONS:  Lithium 900 at bedtime, was on Risperdal that was  discontinued.   PHYSICAL EXAMINATION:  Performed, failed to show any acute findings.   MENTAL STATUS EXAM:  Reveals a healthy, alert young man, appearance and  behavior were notable for poor eye contact.  He was impulsive and hyper  movements.  His speech was rapid, almost pressured.  His mood was irritable,  although he claimed to be anxious.  General appearance revealed a social,  interruptive, not reading social cues correctly.  Thought processes are  logical and goal directed.  Cognition was intact.   ADMISSION DIAGNOSES:   AXIS I:  1. Bipolar disorder, hypomanic.  2. Alcohol abuse.   AXIS II:  No diagnosis.   AXIS  III:  No diagnosis.   AXIS IV:  Moderate.   AXIS V:  Global assessment of function upon admission 30, highest global  assessment of function in past year 65.   LABORATORY DATA:  CBC was within normal limits.  Blood chemistries were  within normal limits.  Thyroid profile was within normal limits.  Lithium  was less than 0.25 upon admission.  Drug screen was positive for marijuana.   COURSE IN HOSPITAL:  He was admitted and started on intensive individual and  group psychotherapy.  He was given Zyprexa 10 mg at bedtime, was given  Eskalith CR 450, to go up to twice a day.  He was switched from Zyprexa to  Risperdal 0.5 at noon and 1 mg at night.  Was also given some Klonopin.  As  the hospitalization progressed, he was feeling much better.  His  mood  improved.  His affect became brighter.  Started developing some anxiety as  he was wanting to be discharged.  On January 26, he was in full contact with  reality, no suicidal or homicidal ideas, increased insight, back on his  medication, wanting to comply, committed to taking these medications.  He  was denying any suicidal or homicidal ideas.  He was going to pursue further  outpatient treatment.  As he was much improved and there was no suicidal or  homicidal ideation, we went ahead and discharged to outpatient follow-up.   DISCHARGE DIAGNOSES:   AXIS I:  1. Bipolar disorder, hypomanic.  2. Alcohol abuse.   AXIS II:  No diagnosis.   AXIS III:  No diagnosis.   AXIS IV:  Moderate.   AXIS V:  Global assessment of function upon discharge 55-60.   DISCHARGE MEDICATIONS:  1. Eskalith CR 450 twice a day.  2. Risperdal 0.5 one in the morning, and Risperdal 1.0 at bedtime.   DISPOSITION:  Follow up with her Dr. Jeanie Sewer.                                                Geoffery Lyons, M.D.    IL/MEDQ  D:  08/20/2002  T:  08/21/2002  Job:  [id number]

## 2010-11-11 NOTE — Discharge Summary (Signed)
[facility]  Patient:    Kevin Hall, Kevin Hall                      MRN: [id number] Adm. Date:  [id number] Disc. Date: [id number] Attending:  Samule Dry                           Discharge Summary  GENERAL MEDICAL FINDINGS:  Patient had two urinalyses that were unremarkable, however, he continued to complain of urinary urgency and dysuria.  He was started empirically on ciprofloxacin 250 mg p.o. b.i.d. with good results.  HOSPITAL COURSE:  Mr. Kevin Hall was admitted to the inpatient psychiatric unit of [facility] and underwent milieu and group supportive psychotherapy.  He did not tolerate Seroquel nor did he tolerate Zyprexa.  He did tolerate Risperdal at 0.5 mg q.h.s. and his paranoia resolved.  He did not experience any adverse medication effects with the Risperdal.  CONDITION ON DISCHARGE:  By the January 25, 2001, Mr. Kevin Hall was cooperative with staff and other patients.  He had not taken his Risperdal the night before because his stomach was upset but he was clear that he was intending to continue taking it and he could see the benefits of it.  His mood, interests and energy were within normal limits.  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  He was not having any abnormal involuntary movements or other adverse medication effects.  He was not having any thoughts of harming himself, thoughts of harming others or racing thoughts.  Vital signs, upon discharge, temperature 96.1, pulse 99, respirations 20, blood pressure sitting 130/66, standing 122/70.  MENTAL STATUS EXAMINATION:  Upon discharge, he is alert.  He is oriented to all spheres.  Speech within normal limits.  He is socially appropriate. Thought process logical, coherent and goal directed.  No looseness of associations.  Thought content with no thoughts of harming himself.  No thoughts of harming others.  No  delusions.  No hallucinations.  Memory within normal limits.  Concentration within normal limits.  The patient is able to listen well and he is less fidgety on his Dexedrine.  Affect broad, appropriate.  Judgment within normal limits.  DISCHARGE DIAGNOSES: Axis I:    1. Bipolar disorder, type 1, stable.            2. Psychotic disorder not otherwise specified, in early clinical               remission.            3. Attention-deficit hyperactivity disorder, stable, on Dexedrine. Axis II:   None. Axis III:  Urinary tract infection, resolved. Axis IV:   Primary support group (divorce). Axis V:    60.  DISCHARGE PLAN:  Mr. Kevin Hall agrees to not drive if drowsy and to call for thoughts of harming himself, thoughts of harming others, distress, racing thoughts.  DISCHARGE MEDICATIONS: 1. Lithobid 300 mg, 1 p.o. q.a.m. and 2 p.o. q.h.s. 2. Risperdal 1 mg, 1/2 p.o. q.h.s. 3. Klonopin 1 mg, 1/2 p.o. b.i.d. p.r.n. and 1 mg q.h.s. p.r.n. 4. Dexedrine 10 mg, 2 p.o. b.i.d. 5. Ciprofloxacin 250 mg, 1 p.o. b.i.d. x 5 more days.  ACTIVITY:  No restrictions.  DIET:  Normal.  FOLLOW-UP APPOINTMENT:  Dr. Jeanie Sewer on January 31, 2001 at 11 a.m.  Patient will also follow up with  Dr. Farrel Demark for psychotherapy.  Patient will also get a follow-up lithium level on Monday, January 28, 2001. DD:  01/25/01 TD:  01/27/01 Job: [id number]

## 2010-11-11 NOTE — H&P (Signed)
NAME:  Kevin Hall, Kevin Hall                         ACCOUNT NO.:  [id number]   MEDICAL RECORD NO.:  [id number]                   PATIENT TYPE:  IPS   LOCATION:  0301                                 FACILITY:  [facility]   PHYSICIAN:  Jeanice Lim, M.D.              DATE OF BIRTH:  1970/12/09   DATE OF ADMISSION:  10/25/2002  DATE OF DISCHARGE:                         PSYCHIATRIC ADMISSION ASSESSMENT   TYPE OF ADMISSION:  This is a voluntary admission.   IDENTIFYING INFORMATION:  Comes from patient and records.   REASON FOR ADMISSION AND SYMPTOMS:  The patient presented with complaint of  thoughts that he wanted to strangle himself with his necklace.  He does have  a history for attempting to strangle himself on a blind cord in the past.  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  He is prescribed Klonopin.  It was unclear whether he was  actually abusing the Klonopin as well.  He does drink beer when taking the  Klonopin and he does acknowledge smoking marijuana.  He is also complaining  of abdominal pain of one week's duration.  Apparently this pain goes into  his bladder, his genitals, his back.  He has had it longer than a week and  he claims it is a scale of 9.  He was sent to the emergency room for medical  clearance.  There was no indication for any acute abdominal process nor any  urinary tract infection.   PAST PSYCHIATRIC HISTORY:  This is his fourth admission to the [facility].  His most recent was back in January.  He also has  an admission at [facility].  He is treated on an outpatient basis by Dr.  Baruch Goldmann who is currently prescribing Eskalith 900 mg p.o. q.d. and  Klonopin 1 mg b.i.d.   SOCIAL HISTORY:  The patient finished the 11th grade.  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  He continues to live with his parents and has  sporadic employment as a Psychologist, clinical.   ALCOHOL AND DRUG HISTORY:  He acknowledges occasional use of marijuana.  He  drinks beer when playing music.  His urine drug screen in the emergency room  was positive for benzodiazepine, for THC, and there was no alcohol detected  in his blood level.   PRIMARY CARE PHYSICIAN:  He says Dr. Manson Passey from family practice is listed on  his medicaid card, however he has never sought treatment there.  He is  unaware of any medical problems and takes no medications.   DRUG ALLERGIES:  He has no known drug allergies.   PHYSICAL EXAMINATION:  Reveals a white male who is somewhat impulsive and  hyper in his presentation.  HEENT:  Revealed no abnormality.  CHEST:  Clear to auscultation and percussion.  HEART:  Revealed a regular, rate, and rhythm without murmur, rub, or  gallops.  ABDOMEN:  He does have an ingrown hair in his right lower quadrant on his  abdomen.  Remainder of his abdominal exam revealed no hepatosplenomegaly.  No localized tenderness.  Bowel sounds were present.  MUSCULOSKELETAL:  Revealed no clubbing, cyanosis, or edema.  He is status  post laparoscopic exam of his right knee in the past.  Cranial nerves II -  XII were grossly intact.   MENTAL STATUS EXAM:  This revealed a white male who is somewhat anxious and  irritable.  His affect is somewhat expansive.  He is cooperative.  He seems  oriented.  His eye contact is good.  His speech is somewhat rapid, somewhat  pressured.  His associations were tight.  He is oriented to person, place  ,and time.  His psychomotor activity is slightly increased.  His thoughts  are rational and coherent.  He has no delusions.  He denies any auditory,  visual, or tactile hallucinations.  He has no ideas of reference.  He has no  bizarre thinking.  He is not hypervigilant.  No thought blocking, thought  insertion, or thought broadcasting were noted.  Appearance - he is neatly  dressed and groomed.  He  has normal hygiene, normal weight.  His memory is  intact.  His judgment is fair.  His insight is fair.  He is easily  distracted.  His abstracting ability is fairly concrete.   DIAGNOSES:   AXIS I:  1. Bipolar disorder, currently hypomanic.  2. Alcohol abuse.  3. Marijuana use.   AXIS II:  No diagnosis.   AXIS III:  No diagnosis.   AXIS IV:  Moderate.   AXIS V:  He is probably 30.   He is admitted for stabilization.  His substance use will be addressed.  His  meds will be readjusted.  His lithium level will be checked.  The patient  has requested Dexedrine to help with focus and concentration.  He states  that a trial of Strattera had failed in the past.  A stimulant trial will be  considered.     Mickie Isidoro Donning, P.A.                  Jeanice Lim, M.D.    MGA/MEDQ  D:  10/26/2002  T:  10/26/2002  Job:  [id number]

## 2011-03-17 LAB — POCT HEMOGLOBIN-HEMACUE: Hemoglobin: 17.1 — ABNORMAL HIGH

## 2011-03-30 LAB — GC/CHLAMYDIA PROBE AMP, GENITAL
Chlamydia, DNA Probe: NEGATIVE
GC Probe Amp, Genital: NEGATIVE

## 2011-06-30 ENCOUNTER — Encounter: Payer: Self-pay | Admitting: *Deleted

## 2011-06-30 ENCOUNTER — Emergency Department (HOSPITAL_COMMUNITY)
Admission: EM | Admit: 2011-06-30 | Discharge: 2011-06-30 | Disposition: A | Discharge status: Home / Self Care | Payer: Medicaid Other | Attending: Emergency Medicine | Admitting: Emergency Medicine

## 2011-06-30 DIAGNOSIS — M545 Low back pain, unspecified: Secondary | ICD-10-CM | POA: Insufficient documentation

## 2011-06-30 DIAGNOSIS — M5430 Sciatica, unspecified side: Secondary | ICD-10-CM | POA: Insufficient documentation

## 2011-06-30 DIAGNOSIS — M79609 Pain in unspecified limb: Secondary | ICD-10-CM | POA: Insufficient documentation

## 2011-06-30 DIAGNOSIS — M5431 Sciatica, right side: Secondary | ICD-10-CM

## 2011-06-30 DIAGNOSIS — Z7982 Long term (current) use of aspirin: Secondary | ICD-10-CM | POA: Insufficient documentation

## 2011-06-30 MED ORDER — PREDNISONE 20 MG PO TABS
60.0000 mg | ORAL_TABLET | Freq: Once | ORAL | Status: AC
  Administered 2011-06-30: 60 mg via ORAL
  Filled 2011-06-30: qty 3

## 2011-06-30 MED ORDER — PREDNISONE 10 MG PO TABS: ORAL_TABLET | ORAL | Status: DC

## 2011-06-30 MED ORDER — OXYCODONE-ACETAMINOPHEN 5-325 MG PO TABS: 2.0000 | ORAL_TABLET | ORAL | Status: AC | PRN

## 2011-06-30 MED ORDER — KETOROLAC TROMETHAMINE 60 MG/2ML IM SOLN
60.0000 mg | Freq: Once | INTRAMUSCULAR | Status: AC
  Administered 2011-06-30: 60 mg via INTRAMUSCULAR
  Filled 2011-06-30: qty 2

## 2011-06-30 NOTE — ED Provider Notes (Signed)
Medical screening examination/treatment/procedure(s) were performed by non-physician practitioner and as supervising physician I was immediately available for consultation/collaboration. Won Kreuzer Y.   Gavin Pound. Gurinder Toral, MD 06/30/11 1624

## 2011-06-30 NOTE — ED Notes (Signed)
Pt reports onset of lower back pain that started yesterday. Ambulatory at triage.

## 2011-06-30 NOTE — ED Notes (Signed)
Pt. reports that he recently slept on a new bed that he bought and awoke to be in lower back pain.  Pt. reports "the lower middle back feels like it is bruised."  Pt. Denies any injury or falls.

## 2011-06-30 NOTE — ED Provider Notes (Signed)
History     CSN: [id number]  Arrival date & time 06/30/11  5621   First MD Initiated Contact with Patient 06/30/11 0750      Chief Complaint  Patient presents with  . Back Pain    (Consider location/radiation/quality/duration/timing/severity/associated sxs/prior treatment) HPI   41 year old male presenting to the ED complaining of low back pain since yesterday. Patient states pain is affecting his low back radiating down to the back of his right thigh. Pain is described as throbbing sensation, worsening with positional change and movement. The pain feels similar to his prior sciatica. Patient recall sleeping in a new mattress, and also had been doing housework more so in the past several days. Patient denies any recent trauma. He denies fever, rash, urinary or bowel incontinence, weakness and numbness, or caudal equina symptoms. He denies blood in urine or history of kidney stones. He has tried aspirin ibuprofen at home without relief.  History reviewed. No pertinent past medical history.  History reviewed. No pertinent past surgical history.  History reviewed. No pertinent family history.  History  Substance Use Topics  . Smoking status: Not on file  . Smokeless tobacco: Not on file  . Alcohol Use: No      Review of Systems  All other systems reviewed and are negative.    Allergies  Review of patient's allergies indicates no known allergies.  Home Medications   Current Outpatient Rx  Name Route Sig Dispense Refill  . ASPIRIN 325 MG PO TABS Oral Take 325 mg by mouth daily.      . IBUPROFEN 800 MG PO TABS Oral Take 800 mg by mouth every 8 (eight) hours as needed. For pain       BP 150/77  Pulse 97  Temp(Src) 97.4 F (36.3 C) (Oral)  Resp 18  SpO2 96%  Physical Exam  Nursing note and vitals reviewed. Constitutional:       Awake, alert, nontoxic appearance  HENT:  Head: Atraumatic.  Eyes: Right eye exhibits no discharge. Left eye exhibits no discharge.    Neck: Neck supple.  Pulmonary/Chest: Effort normal. He exhibits no tenderness.  Abdominal: There is no tenderness. There is no rebound and no CVA tenderness.  Musculoskeletal: He exhibits no tenderness.       Right hip: Normal.       Left hip: Normal.       Right knee: Normal.       Left knee: Normal.       Lumbar back: Normal.       Back:       Baseline ROM, no obvious new focal weakness  Neurological:       Mental status and motor strength appears baseline for patient and situation  Skin: No rash noted.  Psychiatric: He has a normal mood and affect.    ED Course  Procedures (including critical care time)  Labs Reviewed - No data to display No results found.   No diagnosis found.    MDM  Patient with low back pain radiating to right thigh worsen with positional change. He has a history of sciatica in the past. At this time, pt will receive treatments for sciatica and recommendations to follow with a orthopedic Dr. in his symptoms worsen. Patient agrees with plan. He is able to ambulate.        Fayrene Helper, [place] 06/30/11 [phone]

## 2011-08-01 ENCOUNTER — Ambulatory Visit: Payer: Medicaid Other | Attending: Specialist

## 2011-08-01 DIAGNOSIS — IMO0001 Reserved for inherently not codable concepts without codable children: Secondary | ICD-10-CM | POA: Insufficient documentation

## 2011-08-01 DIAGNOSIS — M6281 Muscle weakness (generalized): Secondary | ICD-10-CM | POA: Insufficient documentation

## 2011-08-01 DIAGNOSIS — M25569 Pain in unspecified knee: Secondary | ICD-10-CM | POA: Insufficient documentation

## 2011-08-01 DIAGNOSIS — M25669 Stiffness of unspecified knee, not elsewhere classified: Secondary | ICD-10-CM | POA: Insufficient documentation

## 2011-08-01 DIAGNOSIS — R269 Unspecified abnormalities of gait and mobility: Secondary | ICD-10-CM | POA: Insufficient documentation

## 2011-08-07 ENCOUNTER — Ambulatory Visit: Payer: Medicaid Other | Admitting: Physical Therapy

## 2011-08-14 ENCOUNTER — Ambulatory Visit: Payer: Medicaid Other | Admitting: Physical Therapy

## 2011-08-21 ENCOUNTER — Encounter: Payer: Medicaid Other | Admitting: Physical Therapy

## 2011-08-23 ENCOUNTER — Encounter: Payer: Medicaid Other | Admitting: Physical Therapy

## 2012-02-28 ENCOUNTER — Emergency Department (HOSPITAL_COMMUNITY)
Admission: EM | Admit: 2012-02-28 | Discharge: 2012-02-28 | Disposition: A | Discharge status: Home / Self Care | Payer: Medicaid Other | Attending: Emergency Medicine | Admitting: Emergency Medicine

## 2012-02-28 ENCOUNTER — Encounter (HOSPITAL_COMMUNITY): Payer: Self-pay | Admitting: *Deleted

## 2012-02-28 ENCOUNTER — Emergency Department (HOSPITAL_COMMUNITY): Payer: Medicaid Other

## 2012-02-28 DIAGNOSIS — M542 Cervicalgia: Secondary | ICD-10-CM | POA: Insufficient documentation

## 2012-02-28 DIAGNOSIS — F17200 Nicotine dependence, unspecified, uncomplicated: Secondary | ICD-10-CM | POA: Insufficient documentation

## 2012-02-28 DIAGNOSIS — M62838 Other muscle spasm: Secondary | ICD-10-CM

## 2012-02-28 MED ORDER — IBUPROFEN 400 MG PO TABS
800.0000 mg | ORAL_TABLET | Freq: Once | ORAL | Status: AC
  Administered 2012-02-28: 800 mg via ORAL
  Filled 2012-02-28: qty 2

## 2012-02-28 MED ORDER — HYDROCODONE-ACETAMINOPHEN 5-325 MG PO TABS
1.0000 | ORAL_TABLET | Freq: Once | ORAL | Status: AC
  Administered 2012-02-28: 1 via ORAL
  Filled 2012-02-28: qty 1

## 2012-02-28 MED ORDER — NAPROXEN 375 MG PO TABS: 375.0000 mg | ORAL_TABLET | Freq: Two times a day (BID) | ORAL | Status: AC

## 2012-02-28 MED ORDER — CYCLOBENZAPRINE HCL 10 MG PO TABS: 10.0000 mg | ORAL_TABLET | Freq: Three times a day (TID) | ORAL | Status: AC | PRN

## 2012-02-28 NOTE — ED Notes (Signed)
Pt reports woke up last night with right shoulder "kink," pt reports hx of the same. 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 Pain increases with certain movements. ROM intact.

## 2012-02-28 NOTE — ED Provider Notes (Signed)
History     CSN: [id number]  Arrival date & time 02/28/12  Kevin Hall   First MD Initiated Contact with Patient 02/28/12 2016      Chief Complaint  Patient presents with  . Shoulder Pain    (Consider location/radiation/quality/duration/timing/severity/associated sxs/prior treatment) Patient is a 41 y.o. male presenting with shoulder pain. The history is provided by the patient.  Shoulder Pain This is a new problem. The current episode started yesterday. The problem occurs constantly. The problem has been unchanged. Pertinent negatives include no abdominal pain, anorexia, arthralgias (muscualr shoulder adn neck pain), change in bowel habit, chest pain, chills, congestion, coughing, diaphoresis, fatigue, fever, headaches, joint swelling, myalgias, nausea, neck pain, numbness, rash, sore throat, swollen glands, urinary symptoms, vertigo, visual change, vomiting or weakness. Exacerbated by: certain movements of neck and shoulder. He has tried nothing for the symptoms.    History reviewed. No pertinent past medical history.  History reviewed. No pertinent past surgical history.  History reviewed. No pertinent family history.  History  Substance Use Topics  . Smoking status: Current Everyday Smoker  . Smokeless tobacco: Not on file  . Alcohol Use: No      Review of Systems  Constitutional: Negative for fever, chills, diaphoresis, activity change and fatigue.  HENT: Negative for congestion, sore throat and neck pain.   Respiratory: Negative for cough.   Cardiovascular: Negative for chest pain.  Gastrointestinal: Negative for nausea, vomiting, abdominal pain, anorexia and change in bowel habit.  Genitourinary: Negative for dysuria.  Musculoskeletal: Negative for myalgias, joint swelling and arthralgias (muscualr shoulder adn neck pain).  Skin: Negative for color change, rash and wound.  Neurological: Negative for vertigo, weakness, numbness and headaches.  All other systems reviewed and  are negative.    Allergies  Review of patient's allergies indicates no known allergies.  Home Medications   Current Outpatient Rx  Name Route Sig Dispense Refill  . IBUPROFEN 800 MG PO TABS Oral Take 800 mg by mouth every 8 (eight) hours as needed. For pain      BP 143/79  Pulse 112  Temp 98.3 F (36.8 C) (Oral)  Resp 18  SpO2 98%  Physical Exam  Nursing note and vitals reviewed. Constitutional: He appears well-developed and well-nourished. No distress.  HENT:  Head: Normocephalic and atraumatic.  Eyes: Conjunctivae and EOM are normal.  Neck: Normal range of motion. Neck supple. Muscular tenderness present. No spinous process tenderness present.         Muscle soreness  with flexion and right rotation. Ttp along trapezius muscle and under scapula  Cardiovascular:       Intact distal pulses, capillary refill < 3 seconds  Musculoskeletal:       Full active and passive pain free ROM of right shoulder. See neck exam. All other extremities with normal ROM  Neurological:       No sensory deficit  Skin: He is not diaphoretic.       Skin intact, no tenting    ED Course  Procedures (including critical care time)  Labs Reviewed - No data to display Dg Shoulder Right  02/28/2012  *RADIOLOGY REPORT*  Clinical Data: Right shoulder pain.  History of prior shoulder dislocation  RIGHT SHOULDER - 2+ VIEW  Comparison: 10/12/2010  Findings: No evidence for fracture.  No findings to suggest shoulder separation or dislocation. No worrisome lytic or sclerotic osseous lesion.  IMPRESSION: No acute bony findings.   Original Report Authenticated By: ERIC A. MANSELL, M.D.  No diagnosis found.    MDM  Muscle sapasm, muscular neck pain  Patient X-Ray negative for obvious fracture or dislocation. Pain managed in ED. Pt advised to follow up with orthopedics if symptoms persist. Conservative therapy recommended and discussed. Patient will be dc home & is agreeable with above  plan.         Jaci Carrel, [place] 02/28/12 2125

## 2012-02-29 NOTE — ED Provider Notes (Signed)
Medical screening examination/treatment/procedure(s) were performed by non-physician practitioner and as supervising physician I was immediately available for consultation/collaboration.  Jones Skene, M.D.     Jones Skene, MD 02/29/12 1235

## 2012-05-10 ENCOUNTER — Ambulatory Visit (INDEPENDENT_AMBULATORY_CARE_PROVIDER_SITE_OTHER): Payer: Medicaid Other | Admitting: Psychiatry

## 2012-05-10 DIAGNOSIS — F4322 Adjustment disorder with anxiety: Secondary | ICD-10-CM

## 2012-05-10 NOTE — Progress Notes (Signed)
Psychiatric Assessment Adult  Patient Identification:  Kevin Hall Date of Evaluation:  05/10/2012 Chief Complaint: anxiety History of Chief Complaint:  No chief complaint on file. this is a 41 year old white male whose girlfriend the mother of his child has just left him. They were in a relationship for 4 years. He's not sure how he feels about her or if he trust her. Presently he is living with his son and his own mother. The patient was very avoidant about discussing his relationship or his wife. 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 It led to knee surgery and he seems to have great difficulty standing. Presently he is attempting to get disability for his knee. For now money is a very large issue of this man's eyes. His son is doing fairly well. Patient presents describing excessive stress and anxiety. In close evaluation he denies being depressed. He denies problems with sleep or appetite. He has good energy. He is able to think and concentrate well. He denies anhedonia. He enjoys playing his guitar and spending time with his son. He denies worthlessness and denies being suicidal now or ever. The patient denies now the use of alcohol and drugs but drugs were a big issue. He was psychiatrically hospitalized in 1990 when he was using drugs. He knowledge is that he used to use marijuana but none now and that when he did use it got depressed. Again at this time he denies using any drugs. He denies hallucinations or paranoia. He denies ever having a persistent period of major depression nor of mania. He denies symptoms consistent with generalized anxiety disorder panic disorder or obsessive-compulsive disorder. The patient apparently is seen multiple psychiatrists including Dr. Mindi Curling. In the past she's been treated for ADHD with Dexatrim and with Klonopin 1 mg 3 a day. Noted is had 2 psychiatric hospitalizations in the  past.  HPI Review of Systems Physical Exam  Depressive Symptoms: anxiety,  (Hypo) Manic Symptoms:   Elevated Mood:  No Irritable Mood:  No Grandiosity:  No Distractibility:  No Labiality of Mood:  No Delusions:  No Hallucinations:  No Impulsivity:  No Sexually Inappropriate Behavior:  No Financial Extravagance:  No Flight of Ideas:  No  Anxiety Symptoms: Excessive Worry:  Yes Panic Symptoms:  No Agoraphobia:  No Obsessive Compulsive: No  Symptoms: None, Specific Phobias:  No Social Anxiety:  No  Psychotic Symptoms:  Hallucinations: No None Delusions:  No Paranoia:  No   Ideas of Reference:  No  PTSD Symptoms: Ever had a traumatic exposure:  No Had a traumatic exposure in the last month:  No Re-experiencing: No None Hypervigilance:  No Hyperarousal: No None Avoidance: No None  Traumatic Brain Injury: No   Past Psychiatric History: Diagn  Adhd    Outpatient Care: yes  Substance Abuse Care: yes  20 years ago  Self-Mutilation:   Suicidal Attempts:   Violent Behaviors:    Past Medical History:  No past medical history on file. History of Loss of Consciousness:   Seizure History:   Cardiac History:   Allergies:  No Known Allergies Current Medications:  Current Outpatient Prescriptions  Medication Sig Dispense Refill  . ibuprofen (ADVIL,MOTRIN) 800 MG tablet Take 800 mg by mouth every 8 (eight) hours as needed. For pain      . naproxen (NAPROSYN) 375 MG tablet Take 1 tablet (375 mg total) by mouth 2 (two) times daily.  20 tablet  0  Previous Psychotropic Medications:  Medication Dose  none   Medical Consequences of Substance Abuse:   Legal Consequences of Substance Abuse:  Family Consequences of Substance Abuse:   Blackouts:   DT's:   Withdrawal Symptoms:   None  Social History: Current Place of Residence: [place] of Birth:  Family Members:  Marital Status:  Single Children: 1  Sons:   Daughters:  Relationships:   Education:  [employer] Problems/Performance:  Religious Beliefs/Practices:  History of Abuse: none Teacher, music History:  None. Legal History:  Hobbies/Interests:   Family History:  No family history on file.  Mental Status Examination/Evaluation: Objective:  Appearance: Casual  Eye Contact::  Good  Speech:  Clear and Coherent  Volume:  Normal  Mood:  Anxiety  Affect:  Appropriate  Thought Process:  Coherent  Orientation:  Full  Thought Content:  WDL  Suicidal Thoughts:  No  Homicidal Thoughts:  No  Judgement:  Good  Insight:  Good  Psychomotor Activity:  Normal  Akathisia:  No  Handed:  Right  AIMS (if indicated):    Assets:  calmness    Laboratory/X-Ray Psychological Evaluation(s)        Assessment:  Axis I: Adjustment Disorder with Anxiety  AXIS I Adjustment Disorder with Anxiety  AXIS II Deferred  AXIS III No past medical history on file.   AXIS IV economic problems  AXIS V 51-60 moderate symptoms   Treatment Plan/Recommendations:  Plan of Care: at this time while the patient is resistant I insisted that he started in psychotherapy. His appointment will be set up with Judithe Modest. I will go ahead and start him on a lower dose of Klonopin 1 mg twice a day with 3 refills an opportunity to return to see me in approximately 2 and half months. Apparently this patient has a history of being noncompliant.  Laboratory:    Psychotherapy: Will start   Medications:   Routine PRN Medications:  No  Consultations:   Safety Concerns:    Other:      Lucas Mallow, MD 11/15/201311:42 AM

## 2012-05-15 ENCOUNTER — Telehealth (HOSPITAL_COMMUNITY): Payer: Self-pay

## 2012-05-16 ENCOUNTER — Ambulatory Visit (INDEPENDENT_AMBULATORY_CARE_PROVIDER_SITE_OTHER): Payer: Medicaid Other | Admitting: Licensed Clinical Social Worker

## 2012-05-16 ENCOUNTER — Encounter (HOSPITAL_COMMUNITY): Payer: Self-pay | Admitting: Licensed Clinical Social Worker

## 2012-05-16 DIAGNOSIS — F4322 Adjustment disorder with anxiety: Secondary | ICD-10-CM

## 2012-05-16 NOTE — Progress Notes (Signed)
Patient ID: Kevin Hall, male   DOB: 03-02-1971, 41 y.o.   MRN: [id number] Patient:   Kevin Hall   DOB:   1971-06-23  MR Number:  [id number]  Location:  [facility]  [address] [id number] [place] [address] Dept: [phone]           Date of Service:   05/16/2012  Start Time:   8:30am End Time:   9:20am  Provider/Observer:  Geanie Berlin LCSW       Billing Code/Service: [phone]  Chief Complaint:     Chief Complaint  Patient presents with  . Anxiety  . Stress    Reason for Service:  Patient is referred by Dr. Donell Beers for the treatment of anxiety.   Current Status:  Patient is highly anxious and reports this is due to his girlfriend leaving him and their two year old son without any warning. He is angry and wants to gain full custody of his son. He is concerned about his ability to survive financially and is living with his mother at this time. He reports problems with anxiety for "a long time", but since she left, it feel unmanagable to him. He denies feeling depressed, is motivated and able to parent his son. He he denies any anhedonia or psychosis. His sleep and appetite are wnl. He does not work due to a knee injury and is waiting on his disability application. He has no financial means to support his son now that his girlfriend has left. He has been in contact with her, but does not want her to come back. He has a history of signficant drug and alcohol use twenty years ago, but reports sobriety for many years. He denies any manic episodes.   Reliability of Information: Fair   Behavioral Observation: [name]  presents as a 41 y.o.-year-old  Caucasian Male who appeared his stated age. his dress was Appropriate and he was Fairly Groomed and his manners were Appropriate to the situation.  There were not any physical disabilities noted.  he displayed an appropriate level of cooperation and  motivation.    Interactions:    Active   Attention:   within normal limits  Memory:   within normal limits  Visuo-spatial:   within normal limits  Speech (Volume):  normal  Speech:   stuttered  Thought Process:  Coherent and Relevant  Though Content:  WNL  Orientation:   person, place and time/date  Judgment:   Fair  Planning:   Fair  Affect:    Anxious  Mood:    Anxious  Insight:   Fair  Intelligence:   normal  Marital Status/Living: Not married. Living with his mother and 103 year old son. The mother of his child was living with them, but suddenly left.   Current Employment: Not working. Waiting on disability application. States he can't work due to a knee injury.   Past Employment:  Worked as a Psychologist, occupational at [employer].   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  Patient has been sober for 20 years.   Education:   HS Graduate  Medical History:   Past Medical History  Diagnosis Date  . Anxiety         Outpatient Encounter Prescriptions as of 05/16/2012  Medication Sig Dispense Refill  . ibuprofen (ADVIL,MOTRIN) 800 MG tablet Take 800 mg by mouth every 8 (eight) hours as needed. For  pain      . naproxen (NAPROSYN) 375 MG tablet Take 1 tablet (375 mg total) by mouth 2 (two) times daily.  20 tablet  0          Sexual History:   History  Sexual Activity  . Sexually Active: Yes    Abuse/Trauma History: none  Psychiatric History:  Prior admissions to [facility], [facility] and [facility] for detox and substance induced psychosis.   Family Med/Psych History:  Family History  Problem Relation Age of Onset  . Anxiety disorder Mother   . Cancer - Lung Father   . Alcohol abuse Father   . Bipolar disorder Brother     Risk of Suicide/Violence: virtually non-existent   Impression/DX:  Adjustment disorder with anxiety  Disposition/Plan:  Bi weekly treatment to address anxiety and to develop coping  strategeis.   Diagnosis:    Axis I:   1. Adjustment disorder with anxiety         Axis II: No diagnosis       Axis III:  Chronic pain      Axis IV:  economic problems, occupational problems, problems related to social environment and problems with primary support group          Axis V:  51-60 moderate symptoms

## 2012-05-27 ENCOUNTER — Ambulatory Visit (INDEPENDENT_AMBULATORY_CARE_PROVIDER_SITE_OTHER): Payer: Medicaid Other | Admitting: Licensed Clinical Social Worker

## 2012-05-27 DIAGNOSIS — F4322 Adjustment disorder with anxiety: Secondary | ICD-10-CM

## 2012-05-27 NOTE — Progress Notes (Signed)
   THERAPIST PROGRESS NOTE  Session Time: 4:00pm-4:50pm  Participation Level: Active  Behavioral Response: Fairly GroomedAlertAnxious  Type of Therapy: Individual Therapy  Treatment Goals addressed: Anxiety and Coping  Interventions: CBT, Motivational Interviewing, Solution Focused, Supportive and Reframing  Summary: [name] is a 41 y.o. male who presents with anxious mood and irritable affect. He is highly anxious throughout the entire session, restless in his seat and wringing his hands. He reports no change in his current circumstances and that his girlfriend and mother of his son has not returned home and is uncertain if she wants to return home. He reports that she was smoking marjuania on Thanksgiving at his sisters home. He has spoken to her and he endorses frustration as he suspects she is lying to him. He states many times "I don't know what to do". He is uncertain how to handle her betrayal. He is overwhelmed, but feels he is able to care for his son properly. He reports inability to focus and is unable to complete the paperwork for custody. He wants medication to help him focus and is unhappy that Dr. Alisia Ferrari did not prescribe this for him. He is restless throughout the session. His sleep is 5-6 hours per night and his appetite is wnl.    Suicidal/Homicidal: Nowithout intent/plan  Therapist Response: Assessed patients current functioning and reviewed progress. Reviewed coping strategies. Assessed patients safety and assisted in identifying protective factors.  Reviewed crisis plan with patient. Assisted patient with the expression of his feelings of anxiety and anger. Patient is in denial about the behavior of his girlfriend. He does not want to believe that she is using drugs again and he is resistant to accept that she is lying about getting public housing in exchange for cleaning up a home. He seek answers and wants direction as a means to avoid making a decision himself. Used  CBT to assist patient with the identification of negative distortions and irrational thoughts. Encouraged patient to verbalize alternative and factual responses which challenge thought distortions. Used DBT to practice mindfulness, review distraction list and improve distress tolerance skills. Used motivational interviewing to assist and encourage patient through the change process. Explored patients barriers to change. Reviewed patients self care plan. Assessed  progress related to self care. Patient's self care is fair. Recommend proper diet, regular exercise, socialization and recreation. Recommend patient set proper limits with his girlfriend in order to protect his son.   Plan: Return again in two to three weeks.  Diagnosis: Axis I: Adjustment Disorder with Anxiety    Axis II: No diagnosis    Carmellia Kreisler, LCSW 05/27/2012

## 2012-06-03 ENCOUNTER — Telehealth (HOSPITAL_COMMUNITY): Payer: Self-pay | Admitting: *Deleted

## 2012-06-03 NOTE — Telephone Encounter (Signed)
Pt left [id number] is helping as far as stress goes, but needs to talk about something else. Contacted pt.Call summary follows: He states he is going thru difficult time with legal issues.Cannot afford a Clinical research associate, so he is reading all the papers himself.Court date is 07/04/12. Finds it difficult to concentrate when he has a lot of detailed reading to do. In the past, Dr.Williford gave him Dexedrine to help his focus. He wants to know if that is something he could take now. Would like to come in and sit down and talk to Dr.Plovsky about it.

## 2012-06-04 ENCOUNTER — Ambulatory Visit (HOSPITAL_COMMUNITY): Payer: Self-pay | Admitting: Licensed Clinical Social Worker

## 2012-06-10 ENCOUNTER — Ambulatory Visit (INDEPENDENT_AMBULATORY_CARE_PROVIDER_SITE_OTHER): Payer: Medicaid Other | Admitting: Licensed Clinical Social Worker

## 2012-06-10 DIAGNOSIS — F4322 Adjustment disorder with anxiety: Secondary | ICD-10-CM

## 2012-06-11 NOTE — Progress Notes (Signed)
   THERAPIST PROGRESS NOTE  Session Time:4:00pm-4:50pm  Participation Level: Active  Behavioral Response: Well GroomedAlertAnxious and Irritable  Type of Therapy: Individual Therapy  Treatment Goals addressed: Coping  Interventions: CBT, DBT, Strength-based, Supportive and Reframing  Summary: Kevin Hall is a 40 y.o. male who presents with anxious mood and agitated affect. He reports ongoing agitation and difficulty with his current situation. He did find out that his girlfriend has been using drugs and has been lying to him. He remains uncertain how he wants to handle this relationship. He remains focused on seeking sole custody of his son and wants this done "right away". He spends his time taking care of his son. He and his mother have daily conflict, which he reports is normal and baseline for them. He wants medication to help him focus and is wants to know if Dr. Jeannett Senior will prescribe this for him. He questions the benefit of therapy and wants to know what is going to be accomplished during this time. He is sleeping about 6 hours per night and his appetite is wnl. He denies any drug or alcohol use. He denies any psychosis.    Suicidal/Homicidal: Nowithout intent/plan  Therapist Response: Assessed patients current functioning and reviewed progress. Reviewed coping strategies. Assessed patients safety and assisted in identifying protective factors.  Reviewed crisis plan with patient. Assisted patient with the expression of his feelings of anger and anxiety. Patient is highly agitated and anxious in session. He has tics, has difficulty concentrating and staying focused. He is argumentative and demanding. He states that is he cannot get the medication he wants here, then he will have to go elsewhere. He demonstrates paranoia in his interactions today. He projects strong negative and paranoid transference, frequently questioning this Clinical research associate about what she has said, claiming it is wrong.  Explored his paranoia. Used CBT to assist patient with the identification of negative distortions and irrational thoughts. Encouraged patient to verbalize alternative and factual responses which challenge thought distortions. Used DBT to practice mindfulness, review distraction list and improve distress tolerance skills. Reviewed patients self care plan. Assessed  progress related to self care. Patient's self care is fair. Recommend proper diet, regular exercise, socialization and recreation.   Plan: Return again in two weeks.  Diagnosis: Axis I: Adjustment Disorder with Anxiety    Axis II: No diagnosis    Emalee Knies, LCSW 06/11/2012

## 2012-06-12 ENCOUNTER — Ambulatory Visit (INDEPENDENT_AMBULATORY_CARE_PROVIDER_SITE_OTHER): Payer: Medicaid Other | Admitting: Psychiatry

## 2012-06-12 DIAGNOSIS — F4322 Adjustment disorder with anxiety: Secondary | ICD-10-CM

## 2012-06-12 NOTE — Progress Notes (Signed)
[facility] MD Progress Note  06/12/2012 3:33 PM Kevin Hall  MRN:  [id number] Subjective: concentration issues Diagnosis:  Axis I: Adjustment Disorder with Anxiety Today this patient has returned for earlier appointment. At this time is making an attempt to get legal documents completed. This an attempt to get custody of his son. Apparently the son's mother the patient's girlfriend is very unstable according to the patient. She is apparently on the streets. The patient is concerned that at any time his wife may attempt to get custody of his son. Patient believes this would be very very unsafe for her son. He's making is an attempt to take legal action and she's gotten some legal assistance. Importantly he needs to read a great deal and fill out a great deal of forms. This patient has been diagnosed in the past by 2 different psychiatrists in the community for attention deficit disorder. Dr. Westley Chandler the car and Dr. Teressa Senter both diagnosed and treated him with stimulants which he says was helpful. At that time he used to and that helped him keep his job. The patient who grew up in [place] described a very difficult time in elementary school. He difficulty focusing and concentrating. Things got even worse when went to high school. The patient never finished his homework in a difficulty focusing. In the past as an adult he took Ritalin which was somewhat helpful. This patient presently is clear in all drugs for the last 4 or 5 years. He does not drink alcohol. The patient is requesting a short trial/course of the stimulant to help him through the next few months getting paperwork in place. The patient appears authentic. ADL's:  Intact  Sleep: Good  Appetite:  Good  Suicidal Ideation:  no Homicidal Ideation:  no AEB (as evidenced by):  Psychiatric Specialty Exam: ROS  There were no vitals taken for this visit.There is no height or weight on file to calculate BMI.  General Appearance: Casual and  Disheveled  Eye Contact::  Good  Speech:  Normal Rate  Volume:  Normal  Mood:  Euthymic  Affect:  Appropriate  Thought Process:  Coherent  Orientation:  Full (Time, Place, and Person)  Thought Content:  WDL  Suicidal Thoughts:  No  Homicidal Thoughts:  No  Memory:  normal  Judgement:  Intact  Insight:  Fair  Psychomotor Activity:  Increased  Concentration:  Poor  Recall:  Good  Akathisia:  No  Handed:  Right  AIMS (if indicated):     Assets:  Desire for Improvement  Sleep:      Current Medications: Current Outpatient Prescriptions  Medication Sig Dispense Refill  . ibuprofen (ADVIL,MOTRIN) 800 MG tablet Take 800 mg by mouth every 8 (eight) hours as needed. For pain      . naproxen (NAPROSYN) 375 MG tablet Take 1 tablet (375 mg total) by mouth 2 (two) times daily.  20 tablet  0    Lab Results: No results found for this or any previous visit (from the past 48 hour(s)).  Physical Findings: AIMS:  , ,  ,  ,    CIWA:    COWS:     Treatment Plan Summary: Medication management  Plan:at this time we will go ahead and begin him on Adderall 10 mg XR. In the past he is taking Ritalin and Dexedrine but I will forward these agents. This patient will begin taking Adderall 10 mg XR 1 in the morning and one at noon . He received a one-month  supply with one refill. This patient will return to see me in 2 months. The patient continued taking Klonopin as prescribed and will continue in therapy. Her next visit we will do a combination of drug screen and possibly adjusting his Adderall.  Medical Decision Making Problem Points:  Established problem, stable/improving (1) Data Points:  Review of new medications or change in dosage (2)  I certify that inpatient services furnished can reasonably be expected to improve the patient's condition.   Lucas Mallow 06/12/2012, 3:33 PM

## 2012-07-01 ENCOUNTER — Ambulatory Visit (HOSPITAL_COMMUNITY): Payer: Self-pay | Admitting: Licensed Clinical Social Worker

## 2012-07-03 ENCOUNTER — Ambulatory Visit (INDEPENDENT_AMBULATORY_CARE_PROVIDER_SITE_OTHER): Payer: Medicaid Other | Admitting: Licensed Clinical Social Worker

## 2012-07-03 DIAGNOSIS — F4322 Adjustment disorder with anxiety: Secondary | ICD-10-CM

## 2012-07-03 NOTE — Progress Notes (Signed)
   THERAPIST PROGRESS NOTE  Session Time: 1:00pm-1:50pm  Participation Level: Active  Behavioral Response: Fairly GroomedAlertAnxious and Irritable  Type of Therapy: Individual Therapy  Treatment Goals addressed: Anxiety and Coping  Interventions: CBT, DBT, Solution Focused, Strength-based, Supportive and Reframing  Summary: Kevin Hall is a 42 y.o. male who presents with anxious mood and irritable affect. He reports the Adderal helping his concentration and focus some what. He remains anxious about the situation with his son and go to court tomorrow. He is angry with his girlfriend and is tearful when processing his hurt feelings. He feels that he has been used and taken advantage of because of his kindness. He spends his days taking care of his son and is embarrassed by this situation he finds himself in. He wants to eventually get his son involved in preschool and is fearful that he will be embarrassed because he will be the only dad involved. He would like to work again, but is unable due to knee pain. He is waiting for disability. His sleep and appetite are wnl.    Suicidal/Homicidal: Nowithout intent/plan  Therapist Response: Assessed patients current functioning and reviewed progress. Reviewed coping strategies. Assessed patients safety and assisted in identifying protective factors.  Reviewed crisis plan with patient. Assisted patient with the expression of his feelings of anxiety, frustration and anger. Used DBT to practice mindfulness, review distraction list and improve distress tolerance skills. Used CBT to assist patient with the identification of negative distortions and irrational thoughts. Encouraged patient to verbalize alternative and factual responses which challenge thought distortions. Processed healthy boundaries and assertive communication. Processed and normalized his grief reaction. Reviewed patients self care plan. Assessed  progress related to self care. Patient's self  care is good. Recommend proper diet, regular exercise, socialization and recreation.   Plan: Return again in one to two weeks.  Diagnosis: Axis I: Adjustment Disorder with Anxiety    Axis II: No diagnosis    Toiya Morrish, LCSW 07/03/2012

## 2012-07-17 ENCOUNTER — Ambulatory Visit (INDEPENDENT_AMBULATORY_CARE_PROVIDER_SITE_OTHER): Payer: Medicaid Other | Admitting: Psychiatry

## 2012-07-17 DIAGNOSIS — F909 Attention-deficit hyperactivity disorder, unspecified type: Secondary | ICD-10-CM

## 2012-07-17 DIAGNOSIS — Z8659 Personal history of other mental and behavioral disorders: Secondary | ICD-10-CM

## 2012-07-17 MED ORDER — AMPHETAMINE-DEXTROAMPHET ER 10 MG PO CP24: 10.0000 mg | ORAL_CAPSULE | ORAL | Status: DC

## 2012-07-17 NOTE — Progress Notes (Signed)
[facility] MD Progress Note  07/17/2012 2:06 PM Kevin Hall  MRN:  [id number] Subjective:  Follow up Diagnosis:  Axis I: ADHD, hyperactive type Today the patient was seen alone. His 43-year-old son and his brother are in the waiting room.the patient says that the Adderall has helped a great deal. He was-year-old para documents to take to custody court. To be clear the patient is not married to his girlfriend who is the mother of his 82-year-old child. Unfortunately his girlfriend who is the mother who has been with for 4 years in the last 3 months has left him and is now being with other people but he doesn't like. He is worried that his girlfriend was the mother the child will show up and wanted take the child. He realizes up with his name and her he is on the birth certificate.Marland Kitchen He is fearful that she'll do this.at this time the patient does have a lawyer but is trying to go through this custody court. The patient spent all day with his son. His son is described as a high maintenance. The patient denies being depressed at this time. He he is sleeping and eating well. The patient has good energy. He spends a lot of time playing his guitar and watching TV news. Patient denies the use of drugs or alcohol. Consistent with our plan we will continue taking the Adderall for the number of months while he's going to this legal issue really needs to be sharp and felt lots of different forms. The patient recently was turned down for disability for his knee. He shares with me that he saw a pain doctor but both of them describe that he would not be on pain medications. Generally the patient is doing well. ADL's:  Intact  Sleep: Good  Appetite:  Good  Suicidal Ideation: noHomicidal Ideation: none none AEB (as evidenced by):  Psychiatric Specialty Exam: ROS  There were no vitals taken for this visit.There is no height or weight on file to calculate BMI.  General Appearance: Casual  Eye Contact::  Good  Speech:  NA    Volume:  Normal  Mood:  Anxious  Affect:  Appropriate  Thought Process:  Coherent  Orientation:  Full (Time, Place, and Person)  Thought Content:  WDL  Suicidal Thoughts:  No  Homicidal Thoughts:  No  Memory:  nl  Judgement:  Good  Insight:  Good  Psychomotor Activity:  Normal  Concentration:  Good  Recall:  Good  Akathisia:  No  Handed:  Right  AIMS (if indicated):     Assets:  Desire for Improvement  Sleep:      Current Medications: Current Outpatient Prescriptions  Medication Sig Dispense Refill  . amphetamine-dextroamphetamine (ADDERALL XR) 10 MG 24 hr capsule Take 1 capsule (10 mg total) by mouth every morning.  60 capsule  0  . ibuprofen (ADVIL,MOTRIN) 800 MG tablet Take 800 mg by mouth every 8 (eight) hours as needed. For pain      . naproxen (NAPROSYN) 375 MG tablet Take 1 tablet (375 mg total) by mouth 2 (two) times daily.  20 tablet  0    Lab Results: No results found for this or any previous visit (from the past 48 hour(s)).  Physical Findings: AIMS:  , ,  ,  ,    CIWA:    COWS:     Treatment Plan Summary: Medication management  Plan:At this time we'll continue this patient's Adderall 10 mg XR 2 a day. He'll continue  taking Klonopin as ordered. He takes 2 mg of Klonopin a day taking one at night and dividing the other milligram during the day. The patient will get a urine drug screen. Today the patient received another prescription for Adderall XR 10 mg 2 a day. This patient return to see me in 7 weeks and at that time we'll review the results of his urine screen.Marland Kitchen He'll continue in therapy and has a return appointment with Kevin Hall in the next week.  Medical Decision Making Problem Points:  Established problem, stable/improving (1) Data Points:  Review of new medications or change in dosage (2)  I certify that inpatient services furnished can reasonably be expected to improve the patient's condition.   Kevin Hall 07/17/2012, 2:06 PM

## 2012-07-18 LAB — DRUG SCREEN, URINE
Amphetamine Screen, Ur: POSITIVE — AB
Benzodiazepines.: NEGATIVE
Marijuana Metabolite: NEGATIVE
Opiates: NEGATIVE
Phencyclidine (PCP): NEGATIVE
Propoxyphene: NEGATIVE

## 2012-07-23 ENCOUNTER — Ambulatory Visit (INDEPENDENT_AMBULATORY_CARE_PROVIDER_SITE_OTHER): Payer: Federal, State, Local not specified - Other | Admitting: Licensed Clinical Social Worker

## 2012-07-23 DIAGNOSIS — F4322 Adjustment disorder with anxiety: Secondary | ICD-10-CM

## 2012-07-23 NOTE — Progress Notes (Signed)
   THERAPIST PROGRESS NOTE  Session Time: 4:00pm-4:50pm  Participation Level: Active  Behavioral Response: Fairly GroomedAlertAnxious and Irritable  Type of Therapy: Individual Therapy  Treatment Goals addressed: Anxiety and Coping  Interventions: CBT, Solution Focused, Strength-based, Supportive and Reframing  Summary: Kevin Hall is a 42 y.o. male who presents with anxious mood and agitated affect. He reports ongoing stress related to the situation with the mother of his child. He is torn about trying to get full custody of his son and processes his sadness that she is not the mother he had hoped she would be. He endorses paranoia and is frustrated by the actions of others. He is under financial strain and was recently turned down for disability. He is angry and needs money, but feels unable to work in the capacity in which he used to due to his knees. He feels that his attention is helped by Adderal. His sleep and appetite are wnl.    Suicidal/Homicidal: Nowithout intent/plan  Therapist Response: Assessed patients current functioning and reviewed progress. Reviewed coping strategies. Assessed patients safety and assisted in identifying protective factors.  Reviewed crisis plan with patient. Assisted patient with the expression of his feelings of anxiety and agitation. Used CBT to assist patient with the identification of negative distortions and irrational thoughts. Encouraged patient to verbalize alternative and factual responses which challenge thought distortions. Used DBT to practice mindfulness, review distraction list and improve distress tolerance skills. Reviewed patients self care plan. Assessed  progress related to self care. Patient's self care is good. Recommend proper diet, regular exercise, socialization and recreation. Recommend ending treatment at this time since patient find therapy challenging, is easily agitated by the process and demonstrates poor capacity for insight.  Patient agrees and will contact the office if he needs to return to treatment in the future.   Plan: Return again if needed  Diagnosis: Axis I: Adjustment Disorder with Anxiety    Axis II: No diagnosis    Kenya Kook, LCSW 07/23/2012

## 2012-08-06 ENCOUNTER — Telehealth (HOSPITAL_COMMUNITY): Payer: Self-pay | Admitting: *Deleted

## 2012-08-06 DIAGNOSIS — F909 Attention-deficit hyperactivity disorder, unspecified type: Secondary | ICD-10-CM

## 2012-08-06 NOTE — Telephone Encounter (Signed)
Patient states when he picked up his prescription, there was not enought to take 2 x day.Pharmacy gave 30 not 60 capsules.

## 2012-08-14 ENCOUNTER — Ambulatory Visit (HOSPITAL_COMMUNITY): Payer: Self-pay | Admitting: Psychiatry

## 2012-08-19 MED ORDER — AMPHETAMINE-DEXTROAMPHET ER 10 MG PO CP24: 10.0000 mg | ORAL_CAPSULE | Freq: Two times a day (BID) | ORAL | Status: DC

## 2012-08-19 NOTE — Addendum Note (Signed)
Addended by: Tonny Bollman on: 08/19/2012 11:32 AM   Modules accepted: Orders

## 2012-08-19 NOTE — Telephone Encounter (Signed)
Original RX for Adderall XR 10 mg printed 07/17/12 with instructions to take one daily, although MD gave patient instructions to take twice daily and gave quantity of 60. Due to instructions on RX "Take one daily", pharmacy dispensed #30 capsules only from original RX. This was a 15 day supply.  Dr.Plovsky requested that patient be given new RX for Adderall XR 10 mg with directions to take one capsule in the morning and one at noon, quantity #60 and asked if a provider in the office today could sign as Dr.Plovsky not in office today.

## 2012-08-23 ENCOUNTER — Ambulatory Visit (HOSPITAL_COMMUNITY): Payer: Self-pay | Admitting: Psychiatry

## 2012-09-04 ENCOUNTER — Ambulatory Visit (INDEPENDENT_AMBULATORY_CARE_PROVIDER_SITE_OTHER): Payer: Federal, State, Local not specified - Other | Admitting: Psychiatry

## 2012-09-04 DIAGNOSIS — F4322 Adjustment disorder with anxiety: Secondary | ICD-10-CM

## 2012-09-04 DIAGNOSIS — F988 Other specified behavioral and emotional disorders with onset usually occurring in childhood and adolescence: Secondary | ICD-10-CM

## 2012-09-04 MED ORDER — CLONAZEPAM 0.5 MG PO TBDP: 0.5000 mg | ORAL_TABLET | Freq: Two times a day (BID) | ORAL | Status: DC | PRN

## 2012-09-04 NOTE — Progress Notes (Signed)
[facility] MD Progress Note  09/04/2012 4:37 PM Kevin Hall  MRN:  [id number] Subjective: fairly calm Diagnosis:  Axis I: Adjustment Disorder with Anxiety Today the patient claimed that he is actually doing well. He did give his wife a chance to be with his son which he said did not work out well at all. Because of the clinic that he talked about his apparently not open at this time. As we planned we gave him a set period of months to work on paperwork and get him Adderall to do that. He no longer is involved in a project at this time we'll discontinue his Adderall. Interview other psychiatrist and apparently diagnosed with attention deficit disorder which I get to do. There are some features that make this patient initially in terms of compliancy. At this time he is caring for her son and has no reason to have Adderall. Not working on school or any particular project. Interestingly his drug urine screen did show of course his Adderall but did not show any benzodiazepines yet he supposedly on 1 mg of Klonopin twice a day. He cannot explain this. The patient today says that he is not depressed and he still able to enjoy television in the daytime he played the guitar more. He is thinking concentrating well. He denies anxiety. He describes trying to balance the Adderall and Klonopin. He implies that Klonopin put him asleep in the Adderall wakes him up. His interaction of course is therapeutically inappropriate. He talked a bit about Strattera I possibly would consider this agent in the future. Her now I will clarify how his therapy is going. Totally continue the Klonopin if he is in therapy. In today's interview the patient appears tense and guarded. Given that he claims Klonopin without Adderall would be overly sedated he agreed to reduce to .5 mg twice a day with a 0.5 dose if he needs it when necessary.the patient denies the use of drugs or alcohol and noted is that his urine screen was absolutely clean. I do think  in retrospect it was reasonable to give him a period of time were taking the Adderall made him more organized and focused to create a specific task. The patient appears on edge. I shared with him that perspective and from the information in this computer he is supposed to be on Klonopin and according to the urine drug screen he's not. Stress some concern of how this appears that it is that he is selling the Klonopin. He denied this. Today I asked him to repeat his urine drug screen at this time. ADL's:  Intact  Sleep: Good  Appetite:  Good  Suicidal Ideation:  no Homicidal Ideation:  no AEB (as evidenced by):  Psychiatric Specialty Exam: ROS  There were no vitals taken for this visit.There is no height or weight on file to calculate BMI.  General Appearance: Casual  Eye Contact::  Good  Speech:  Normal Rate  Volume:  Normal  Mood:  Euthymic  Affect:  Labile  Thought Process:  Coherent  Orientation:  Full (Time, Place, and Person)  Thought Content:  WDL  Suicidal Thoughts:  No  Homicidal Thoughts:  No  Memory:  nl  Judgement:  Good  Insight:  Good  Psychomotor Activity:  Increased  Concentration:  Good  Recall:  Good  Akathisia:  No  Handed:  Right  AIMS (if indicated):     Assets:  Desire for Improvement  Sleep:      Current Medications: Current  Outpatient Prescriptions  Medication Sig Dispense Refill  . amphetamine-dextroamphetamine (ADDERALL XR) 10 MG 24 hr capsule Take 1 capsule (10 mg total) by mouth 2 (two) times daily. Take 1 in the morning and 1 at noon  60 capsule  0  . clonazePAM (KLONOPIN) 0.5 MG disintegrating tablet Take 1 tablet (0.5 mg total) by mouth 2 (two) times daily as needed for anxiety. 1 Bid  1prn  75 tablet  2  . ibuprofen (ADVIL,MOTRIN) 800 MG tablet Take 800 mg by mouth every 8 (eight) hours as needed. For pain      . naproxen (NAPROSYN) 375 MG tablet Take 1 tablet (375 mg total) by mouth 2 (two) times daily.  20 tablet  0   No current  facility-administered medications for this visit.    Lab Results: No results found for this or any previous visit (from the past 48 hour(s)).  Physical Findings: AIMS:  , ,  ,  ,    CIWA:    COWS:     Treatment Plan Summary: At this time we will reduce his Klonopin from 1 mg twice a day to taking 0.5 mg twice a day with a 0.5 mg when necessary dose. At this time we'll discontinue his Adderall. The patient return to see me in 2 months and in the next 24 hours we'll obtain a repeat urine drug screen.at this time my working diagnosis is actually adjustment disorder with anxious mood state. I believe this is overall improved and my plan is consistent with his wishes is to slowly taper him off Klonopin over the next few months.  Plan:  Medical Decision Making Problem Points:  Established problem, stable/improving (1) Data Points:  Review of new medications or change in dosage (2)  I certify that inpatient services furnished can reasonably be expected to improve the patient's condition.   Lucas Mallow 09/04/2012, 4:37 PM

## 2012-09-17 LAB — DRUG SCREEN, URINE
Barbiturate Quant, Ur: NEGATIVE
Cocaine Metabolites: NEGATIVE
Creatinine,U: 127.59 mg/dL
Marijuana Metabolite: NEGATIVE
Opiates: NEGATIVE
Phencyclidine (PCP): NEGATIVE

## 2012-09-23 ENCOUNTER — Ambulatory Visit (INDEPENDENT_AMBULATORY_CARE_PROVIDER_SITE_OTHER): Payer: Federal, State, Local not specified - Other | Admitting: Licensed Clinical Social Worker

## 2012-09-23 DIAGNOSIS — F4322 Adjustment disorder with anxiety: Secondary | ICD-10-CM

## 2012-09-23 NOTE — Progress Notes (Signed)
   THERAPIST PROGRESS NOTE  Session Time: 4:00pm-4:50pm  Participation Level: Active  Behavioral Response: Fairly GroomedAlertAnxious and Irritable  Type of Therapy: Individual Therapy  Treatment Goals addressed: Anxiety and Coping  Interventions: CBT, Strength-based, Supportive and Reframing  Summary: Kevin Hall is a 42 y.o. male who presents with anxious mood and affect. He has returned to therapy at the insistence of Dr. Jeannett Senior. He is highly anxious throughout the entire session. His speech is pressured and tangential. He has difficulty concentrating and fidgets in his chair the entire session. He is angry that his disability has been denied and he talks about his anger towards the doctor who did his knee surgery. He has been angry for years and does not want to let his anger go because he is unable to work due to knee pain. He is angry that the doctor did not talk to him after his surgery was done. He is focused on the fact that his medical records do not communicate what he believes they should. He continues to be the primary care taker of his son and is frustrated with his ex-girlfriend over her erratic behavior related to their son. He continues to explore gaining full legal custody. His sleep and appetite are wnl.    Suicidal/Homicidal: Nowithout intent/plan  Therapist Response: Assessed patients current functioning and reviewed progress. Reviewed coping strategies. Assessed patients safety and assisted in identifying protective factors.  Reviewed crisis plan with patient. Assisted patient with the expression of frustration and fear. Reviewed patients self care plan. Assessed progress related to self care. Patients self care is fair. Recommend daily exercise, increased socialization and recreation. Used CBT to assist patient with the identification of negative distortions and irrational thoughts. Encouraged patient to verbalize alternative and factual responses which challenge thought  distortions. Patient demonstrates a slight degree of paranoia related to his ex-girlfriend's behavior. He is fearful that she will kidnap their son and has frequent obsessive thoughts about this. Discussed the issue of Klonopin not showing up on his most recent drug screen even though he reports taking this daily. He had another drug screen done on 3/24, but it is not in the chart yet. He is easily agitated and defensive. Used motivational interviewing to assist and encourage patient through the change process. Explored patients barriers to change.   Plan: Return again in four weeks.  Diagnosis: Axis I: Adjustment Disorder with Mixed Emotional Features    Axis II: No diagnosis    Renee Beale, LCSW 09/23/2012

## 2012-10-14 ENCOUNTER — Encounter (HOSPITAL_COMMUNITY): Payer: Self-pay

## 2012-10-21 ENCOUNTER — Ambulatory Visit (INDEPENDENT_AMBULATORY_CARE_PROVIDER_SITE_OTHER): Payer: Federal, State, Local not specified - Other | Admitting: Licensed Clinical Social Worker

## 2012-10-21 DIAGNOSIS — F4322 Adjustment disorder with anxiety: Secondary | ICD-10-CM

## 2012-10-21 NOTE — Progress Notes (Signed)
   THERAPIST PROGRESS NOTE  Session Time: 3:00pm-3:45pm  Participation Level: Active  Behavioral Response: Fairly GroomedAlertAnxious  Type of Therapy: Individual Therapy  Treatment Goals addressed: Coping  Interventions: CBT, Solution Focused, Strength-based, Supportive and Reframing  Summary: Kevin Hall is a 42 y.o. male who presents with anxious mood and affect. He reports that nothing has changed regarding the conflict between he and his ex-girlfriend. She continues to attempt to see their son and patient does not allow her due to her behavior. He processes his frustration with the situation and questions why she behaves this way. He explores his sadness that she is not interested in parenting her son. He is very protective of his son and questions if he is too protective. He does not take the bus or frequent parts of town where he thinks she may be. His sleep and appetite are wnl. He is under financial pressure and will continue to live with his mother while he appeals disability.    Suicidal/Homicidal: Nowithout intent/plan  Therapist Response: Assessed patients current functioning and reviewed progress. Reviewed coping strategies. Assessed patients safety and assisted in identifying protective factors.  Reviewed crisis plan with patient. Assisted patient with the expression of frustration. Reviewed patients self care plan. Assessed progress related to self care. Patients self care is good. Recommend daily exercise, increased socialization and recreation. Used CBT to assist patient with the identification of negative distortions and irrational thoughts. Encouraged patient to verbalize alternative and factual responses which challenge thought distortions. Processed and normalized patients grief reaction.   Plan: Return again in four weeks.  Diagnosis: Axis I: Adjustment Disorder with Mixed Emotional Features    Axis II: No diagnosis    Alphia Behanna, LCSW 10/21/2012

## 2012-11-06 ENCOUNTER — Ambulatory Visit (INDEPENDENT_AMBULATORY_CARE_PROVIDER_SITE_OTHER): Payer: Federal, State, Local not specified - Other | Admitting: Psychiatry

## 2012-11-06 VITALS — BP 144/75 | HR 82 | Ht 68.5 in | Wt 155.6 lb

## 2012-11-06 DIAGNOSIS — F4323 Adjustment disorder with mixed anxiety and depressed mood: Secondary | ICD-10-CM

## 2012-11-06 MED ORDER — CLONAZEPAM 0.5 MG PO TBDP: 0.5000 mg | ORAL_TABLET | Freq: Two times a day (BID) | ORAL | Status: DC

## 2012-11-06 NOTE — Progress Notes (Signed)
[facility] MD Progress Note  11/06/2012 4:03 PM Kevin Hall  MRN:  [id number] Subjective: well Diagnosis:  Axis I: Adjustment Disorder with Anxiety The patient was seen alone. His mother and his son were in the car. The patient seemed to be a bit calmer. He did not talk anymore about attention deficit disorder or the need for stimulants. Once again however curiously his urine drug screen was negative for benzodiazepines despite the fact that he takes a small dose. To my knowledge he no longer is taking any stimulants. The patient denies being depressed. He says his anxiety is relatively well controlled taking Klonopin 0.5 mg twice a day. He still able to enjoys guitar and able to read. He doesn't really overtly complain of problems concentrating. He is sleeping well and eating well. He says he is constantly on edge for the fear that his wife will attempt to get his son. Now he learned always realize is going to be worry about that. His mother is helping him a great deal. He feels very committed to his mother is in her 43s. The patient denies using alcohol. He denies the use of drugs. Son is to a half and is doing well. ADL's:  Intact  Sleep: Good  Appetite:  Good  Suicidal Ideation:  none Homicidal Ideation:  no AEB (as evidenced by):  Psychiatric Specialty Exam: ROS  Blood pressure 144/75, pulse 82, height 5' 8.5" (1.74 m), weight 155 lb 9.6 oz (70.58 kg).Body mass index is 23.31 kg/(m^2).  General Appearance:   Eye Contact::  Good  Speech:  Clear and Coherent  Volume:  Normal  Mood:  Euthymic  Affect:  Congruent  Thought Process:  Goal Directed  Orientation:  Full (Time, Place, and Person)  Thought Content:  WDL  Suicidal Thoughts:  No  Homicidal Thoughts:  No  Memory:  nl  Judgement:  Fair  Insight:  Good  Psychomotor Activity:  Normal  Concentration:  Good  Recall:  Good  Akathisia:  No  Handed:  Right  AIMS (if indicated):     Assets:  Desire for Improvement  Sleep:       Current Medications: Current Outpatient Prescriptions  Medication Sig Dispense Refill  . amphetamine-dextroamphetamine (ADDERALL XR) 10 MG 24 hr capsule Take 1 capsule (10 mg total) by mouth 2 (two) times daily. Take 1 in the morning and 1 at noon  60 capsule  0  . clonazePAM (KLONOPIN) 0.5 MG disintegrating tablet Take 1 tablet (0.5 mg total) by mouth 2 (two) times daily.  60 tablet  3  . ibuprofen (ADVIL,MOTRIN) 800 MG tablet Take 800 mg by mouth every 8 (eight) hours as needed. For pain      . naproxen (NAPROSYN) 375 MG tablet Take 1 tablet (375 mg total) by mouth 2 (two) times daily.  20 tablet  0   No current facility-administered medications for this visit.    Lab Results: No results found for this or any previous visit (from the past 48 hour(s)).  Physical Findings: AIMS:  , ,  ,  ,    CIWA:    COWS:     Treatment Plan Summary: Medication management  Plan:at this time I will continue his Klonopin 0.5 mg twice a day. I will not prescribe any stimulants at this time. Patient will continue in therapy in the setting. He apparently comes approximately once a month. The patient return to see me in 3 months.  Medical Decision Making Problem Points:  Established problem, stable/improving (1)  and Established problem, worsening (2) Data Points:  Review of new medications or change in dosage (2)  I certify that inpatient services furnished can reasonably be expected to improve the patient's condition.   Dredyn Gubbels IRVING 11/06/2012, 4:03 PM

## 2012-11-25 ENCOUNTER — Ambulatory Visit (INDEPENDENT_AMBULATORY_CARE_PROVIDER_SITE_OTHER): Payer: Federal, State, Local not specified - Other | Admitting: Licensed Clinical Social Worker

## 2012-11-25 DIAGNOSIS — F4323 Adjustment disorder with mixed anxiety and depressed mood: Secondary | ICD-10-CM

## 2012-11-25 NOTE — Progress Notes (Signed)
   THERAPIST PROGRESS NOTE  Session Time: 4:00pm-4:50pm  Participation Level: Active  Behavioral Response: Well GroomedAlertAnxious and Irritable  Type of Therapy: Individual Therapy  Treatment Goals addressed: Coping  Interventions: CBT, Strength-based, Supportive, Reframing and Other: grief and loss  Summary: Kevin Hall is a 42 y.o. male who presents with anxious mood and affect. He reports ongoing frustration with his ex-girlfriend and describes his most recent interaction with her on Mothers Day, when she showed up at his home unannounced. He continues to restrict any visitation with their son, which is appropriate and safe at this time. He enjoys being a father and has deep love for his son. He processes his grief over the fact that his son does not have a mother. He explores what to tell his son when he asks where his mother is. Patient asks appropriate questions about parenting and simply needs feedback and normalizing of his experience. He remains very fearful that he will be out with his son and his ex-girlfriend will attempt to kidnap their son with the help of someone else. He will avoid certain places and has will not use the bus anymore out of this fear. His sleep and appetite are wnl.   Suicidal/Homicidal: Nowithout intent/plan  Therapist Response: Assessed patients current functioning and reviewed progress. Reviewed coping strategies. Assessed patients safety and assisted in identifying protective factors.  Reviewed crisis plan with patient. Assisted patient with the expression of frustration and questions about why his ex-girlfriend has abandoned him and their son. Reviewed patients self care plan. Assessed progress related to self care. Patients self care is good. Recommend daily exercise, increased socialization and recreation. Used CBT to assist patient with the identification of negative distortions and irrational thoughts. Encouraged patient to verbalize alternative and  factual responses which challenge thought distortions. Processed and normalized patients grief reaction.   Plan: Return again in eight weeks.  Diagnosis: Axis I: Adjustment Disorder with Mixed Emotional Features    Axis II: No diagnosis    Rica Heather, LCSW 11/25/2012

## 2013-01-27 ENCOUNTER — Ambulatory Visit (INDEPENDENT_AMBULATORY_CARE_PROVIDER_SITE_OTHER): Payer: Federal, State, Local not specified - Other | Admitting: Licensed Clinical Social Worker

## 2013-01-27 DIAGNOSIS — F4322 Adjustment disorder with anxiety: Secondary | ICD-10-CM

## 2013-01-27 NOTE — Progress Notes (Signed)
   THERAPIST PROGRESS NOTE  Session Time: 1:00pm-1:50pm  Participation Level: Active  Behavioral Response: Fairly GroomedAlertAnxious and Irritable  Type of Therapy: Individual Therapy  Treatment Goals addressed: Coping  Interventions: Strength-based and Supportive  Summary: Kevin Hall is a 42 y.o. male who presents with anxious mood and agitated affect. He reports doing well over the past two months and continues to focus on parenting his son. He processes his decision not to move forward seeking sole custody of his son. He does not want to put himself or his mother through this stress and he does not have the resources to follow through with this. He is still waiting for his disability hearing and has no source of income. He reports some anxiety on occasion related to taking his son outside. He is fearful that his ex-girlfriend will find them and attempt to take their son. He will avoid taking his son outside on occasion due to this. He takes different routes to avoid the possibility of running into her. His sleep and appetite are wnl.   Suicidal/Homicidal: Nowithout intent/plan  Therapist Response: Assessed patients current functioning and reviewed progress. Reviewed coping strategies. Assessed patients safety and assisted in identifying protective factors.  Reviewed crisis plan with patient. Assisted patient with the expression of frustration. Reviewed patients self care plan. Assessed progress related to self care. Patients self care is good. Recommend daily exercise, increased socialization and recreation. Processed and normalized patients grief reaction.   Plan: Return again in eight weeks.  Diagnosis: Axis I: Adjustment Disorder with Anxiety    Axis II: No diagnosis    Kevin Carline, LCSW 01/27/2013

## 2013-02-13 ENCOUNTER — Ambulatory Visit (INDEPENDENT_AMBULATORY_CARE_PROVIDER_SITE_OTHER): Payer: Federal, State, Local not specified - Other | Admitting: Psychiatry

## 2013-02-13 DIAGNOSIS — F4322 Adjustment disorder with anxiety: Secondary | ICD-10-CM

## 2013-02-13 MED ORDER — CLONAZEPAM 0.5 MG PO TBDP: 0.5000 mg | ORAL_TABLET | Freq: Two times a day (BID) | ORAL | Status: DC

## 2013-02-13 NOTE — Progress Notes (Signed)
[facility] MD Progress Note  02/13/2013 3:54 PM Kevin Hall  MRN:  [id number] Subjective:  Tired Today the patient is seen alone. He took the bus to get here. He is on time in fact he is 15 minutes pearly. Rarely he is no different. Denies being depressed. He has mild anxiety which seems to helpto be helped by Klonopin 0.5 mg twice a day. The patient says she is sleeping and eating well. He's got good energy and good concentration. Again he brings up wanting to be on a stimulant to sit says it helps him be more productive into his goals. However the reality is his we made arrangement that when he had specific goals like looking through legal documents and needed help concentrating I was willing to use a stimulant for focus peer time. The reality is he has no particular processes college or work activities it is doing at this time. All he is doing anything a good job is take care of his 43-year-old son. The patient is not psychotic. He denies the use of alcohol or drugs. As best I can tell using Klonopin appropriately 0.5 mg twice a day. He does have her request to change the Xanax which I refused. At this time we'll continue this medication. The patient says that he hasn't seen his therapist in a while but doesn't feel a need to. At this time I will start extending his. Scalp but I will continue his Klonopin. Diagnosis:   DSM5: Schizophrenia Disorders:   Obsessive-Compulsive Disorders:   Trauma-Stressor Disorders:   Substance/Addictive Disorders:   Depressive Disorders:    Axis I: Adjustment Disorder with Anxiety  ADL's:  Intact  Sleep: Good  Appetite:  Good  Suicidal Ideation:  no Homicidal Ideation:  no AEB (as evidenced by):  Psychiatric Specialty Exam: ROS  Blood pressure 114/65, pulse 101, height 5' 8.5" (1.74 m), weight 160 lb (72.576 kg).Body mass index is 23.97 kg/(m^2).  General Appearance: Casual  Eye Contact::  Good  Speech:  Clear and Coherent  Volume:  Normal  Mood:  Anxious   Affect:  Appropriate  Thought Process:  Coherent  Orientation:  Full (Time, Place, and Person)  Thought Content:  WDL  Suicidal Thoughts:  No  Homicidal Thoughts:  No  Memory:  NA  Judgement:  Fair  Insight:  Fair  Psychomotor Activity:  Normal  Concentration:  Good  Recall:  Good  Akathisia:  No  Handed:  Right  AIMS (if indicated):     Assets:  Desire for Improvement  Sleep:      Current Medications: Current Outpatient Prescriptions  Medication Sig Dispense Refill  . amphetamine-dextroamphetamine (ADDERALL XR) 10 MG 24 hr capsule Take 1 capsule (10 mg total) by mouth 2 (two) times daily. Take 1 in the morning and 1 at noon  60 capsule  0  . clonazePAM (KLONOPIN) 0.5 MG disintegrating tablet Take 1 tablet (0.5 mg total) by mouth 2 (two) times daily.  60 tablet  4  . ibuprofen (ADVIL,MOTRIN) 800 MG tablet Take 800 mg by mouth every 8 (eight) hours as needed. For pain      . naproxen (NAPROSYN) 375 MG tablet Take 1 tablet (375 mg total) by mouth 2 (two) times daily.  20 tablet  0   No current facility-administered medications for this visit.    Lab Results: No results found for this or any previous visit (from the past 48 hour(s)).  Physical Findings: AIMS:  , ,  ,  ,  CIWA:    COWS:     Treatment Plan Summary: At this time we shall continue his Klonopin 0.5 mg twice a day. The patient to return to see me in approximately 4 months.  Plan:  Medical Decision Making Problem Points:  Established problem, stable/improving (1) Data Points:  Review of medication regiment & side effects (2)  I certify that inpatient services furnished can reasonably be expected to improve the patient's condition.   Lucas Mallow 02/13/2013, 3:55 PM

## 2013-03-31 ENCOUNTER — Ambulatory Visit (INDEPENDENT_AMBULATORY_CARE_PROVIDER_SITE_OTHER): Payer: Federal, State, Local not specified - Other | Admitting: Licensed Clinical Social Worker

## 2013-03-31 DIAGNOSIS — F4322 Adjustment disorder with anxiety: Secondary | ICD-10-CM

## 2013-03-31 NOTE — Progress Notes (Signed)
   THERAPIST PROGRESS NOTE  Session Time: 1:00pm-1:30pm  Participation Level: Active  Behavioral Response: Well GroomedAlertAnxious and Irritable  Type of Therapy: Individual Therapy  Treatment Goals addressed: Anxiety and Coping  Interventions: CBT, Solution Focused, Strength-based, Supportive and Reframing  Summary: Kevin Hall is a 42 y.o. male who presents with anxious mood and agitated affect. He reports that he is doing well, spending his time taking care of his son. He endorses natural fatigue. He and his mother have conflict often, but patient does not want to discuss this. He does state that he feels his mother likes to control him and is trying to do this. He asks about how to deal with his ex-girlfriends belongings and personal papers. He remains fearful that he will run into her so he does not take his son on the bus. His sleep and appetite are wnl.   Suicidal/Homicidal: Nowithout intent/plan  Therapist Response: Assessed patients current functioning and reviewed progress. Reviewed coping strategies. Assessed patients safety and assisted in identifying protective factors.  Reviewed crisis plan with patient. Assisted patient with the expression of anxiety and frustration . Reviewed patients self care plan. Assessed progress related to self care. Patients self care is good. Recommend daily exercise, increased socialization and recreation. Used CBT to assist patient with the identification of negative distortions and irrational thoughts. Encouraged patient to verbalize alternative and factual responses which challenge thought distortions. Processed and normalized patients grief reaction related to how his relationship has ended. Patient is doing well and is not able to tolerate the therapeutic process well, therefore sessions will be spaced out more. Patient agrees to this.   Plan: Return again in three months  Diagnosis: Axis I: Adjustment Disorder with Mixed Emotional  Features    Axis II: No diagnosis    Yazleen Molock, LCSW 03/31/2013

## 2013-05-01 ENCOUNTER — Other Ambulatory Visit: Payer: Self-pay

## 2013-06-13 ENCOUNTER — Ambulatory Visit (INDEPENDENT_AMBULATORY_CARE_PROVIDER_SITE_OTHER): Payer: Federal, State, Local not specified - Other | Admitting: Psychiatry

## 2013-06-13 VITALS — BP 122/66 | HR 100 | Ht 69.0 in | Wt 165.0 lb

## 2013-06-13 DIAGNOSIS — F4323 Adjustment disorder with mixed anxiety and depressed mood: Secondary | ICD-10-CM

## 2013-06-13 DIAGNOSIS — F4322 Adjustment disorder with anxiety: Secondary | ICD-10-CM

## 2013-06-13 MED ORDER — CLONAZEPAM 0.5 MG PO TBDP: 0.5000 mg | ORAL_TABLET | Freq: Two times a day (BID) | ORAL | Status: DC

## 2013-06-13 NOTE — Progress Notes (Signed)
[facility] MD Progress Note  06/13/2013 10:51 AM Kevin Hall  MRN:  [id number] Subjective:  Calmer Today the patient is seen one time. He asked he seems better. He seems to more focus. His last drug seeking. He is taking care of his son and very committed to him. The patient denies daily depression. He is sleeping and eating fairly well. He continues to play music liking his guitar. He continues to be somewhat obsessed about his right knee surgery and his problems with his orthopedic surgeon. The patient is not on disability at all. Right now I believe he is financially supported mainly by his elderly mother. The patient sees a therapist here about once every 2-3 months. Overall he seems stable. Is not drinking or using any drugs. He takes his medicines mainly as prescribed. His anxiety seems to be better. At this time he seems to be prepared for the holidays. Today for the first time he didn't bring up a wish for stimulants. I think he seems more resolved in this regard. 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 Diagnosis:   DSM5: Schizophrenia Disorders:   Obsessive-Compulsive Disorders:   Trauma-Stressor Disorders:   Substance/Addictive Disorders:   Depressive Disorders:    Axis I: Adjustment Disorder with Anxiety  ADL's:  Intact  Sleep: Good  Appetite:  Good  Suicidal Ideation:  no Homicidal Ideation:  no AEB (as evidenced by):  Psychiatric Specialty Exam: ROS  Blood pressure 122/66, pulse 100, height 5\' 9"  (1.753 m), weight 165 lb (74.844 kg).Body mass index is 24.36 kg/(m^2).  General Appearance: Casual  Eye Contact::  Good  Speech:  Clear and Coherent  Volume:  Normal  Mood:  Euthymic  Affect:  Blunt  Thought Process:  Coherent  Orientation:  Full (Time, Place, and Person)  Thought Content:  WDL  Suicidal Thoughts:  No  Homicidal Thoughts:  No  Memory:  nl  Judgement:  Good  Insight:  Good  Psychomotor Activity:  Normal  Concentration:  Good  Recall:   Good  Akathisia:  No  Handed:  Right  AIMS (if indicated):     Assets:  Desire for Improvement  Sleep:      Current Medications: Current Outpatient Prescriptions  Medication Sig Dispense Refill  . amphetamine-dextroamphetamine (ADDERALL XR) 10 MG 24 hr capsule Take 1 capsule (10 mg total) by mouth 2 (two) times daily. Take 1 in the morning and 1 at noon  60 capsule  0  . clonazePAM (KLONOPIN) 0.5 MG disintegrating tablet Take 1 tablet (0.5 mg total) by mouth 2 (two) times daily.  60 tablet  4  . clonazePAM (KLONOPIN) 0.5 MG disintegrating tablet Take 1 tablet (0.5 mg total) by mouth 2 (two) times daily. 1 TID  90 tablet  4  . ibuprofen (ADVIL,MOTRIN) 800 MG tablet Take 800 mg by mouth every 8 (eight) hours as needed. For pain       No current facility-administered medications for this visit.    Lab Results: No results found for this or any previous visit (from the past 48 hour(s)).  Physical Findings: AIMS:  , ,  ,  ,    CIWA:    COWS:     Treatment Plan Summary: At this time the patient is doing fairly well. We will slightly increase his Klonopin to taking 0.5 mg 1 twice a day and 1 extra when necessary. Overall he is doing well.  Plan:  Medical Decision Making Problem Points:  Established problem, stable/improving (1)  Data Points:  Review of medication regiment & side effects (2)  I certify that inpatient services furnished can reasonably be expected to improve the patient's condition.   Kevin Hall 06/13/2013, 10:51 AM

## 2013-06-30 ENCOUNTER — Encounter (HOSPITAL_COMMUNITY): Payer: Self-pay | Admitting: Licensed Clinical Social Worker

## 2013-06-30 ENCOUNTER — Ambulatory Visit (HOSPITAL_COMMUNITY): Payer: Self-pay | Admitting: Licensed Clinical Social Worker

## 2013-06-30 NOTE — Progress Notes (Signed)
Patient ID: Kevin SizerJoshua Hall, male   DOB: 1970-09-05, 43 y.o.   MRN: [id number] Patient cancelled late due to having the flu.

## 2013-07-23 ENCOUNTER — Encounter (HOSPITAL_COMMUNITY): Payer: Self-pay | Admitting: Licensed Clinical Social Worker

## 2013-08-11 ENCOUNTER — Ambulatory Visit (HOSPITAL_COMMUNITY): Payer: Self-pay | Admitting: Licensed Clinical Social Worker

## 2013-09-17 ENCOUNTER — Telehealth (HOSPITAL_COMMUNITY): Payer: Self-pay | Admitting: *Deleted

## 2013-09-18 ENCOUNTER — Encounter (HOSPITAL_COMMUNITY): Payer: Self-pay | Admitting: *Deleted

## 2013-09-18 NOTE — Telephone Encounter (Signed)
Advised patient that Release of Information needed to send requested letter to attorney at [employer]. Patient states he will come to office and fill out in next few days

## 2013-09-25 ENCOUNTER — Encounter (HOSPITAL_COMMUNITY): Payer: Self-pay | Admitting: Emergency Medicine

## 2013-09-25 ENCOUNTER — Emergency Department (HOSPITAL_COMMUNITY)
Admission: EM | Admit: 2013-09-25 | Discharge: 2013-09-25 | Disposition: A | Discharge status: Home / Self Care | Payer: Federal, State, Local not specified - Other | Attending: Emergency Medicine | Admitting: Emergency Medicine

## 2013-09-25 DIAGNOSIS — M545 Low back pain, unspecified: Secondary | ICD-10-CM | POA: Insufficient documentation

## 2013-09-25 DIAGNOSIS — M2550 Pain in unspecified joint: Secondary | ICD-10-CM

## 2013-09-25 DIAGNOSIS — M25569 Pain in unspecified knee: Secondary | ICD-10-CM | POA: Insufficient documentation

## 2013-09-25 DIAGNOSIS — G8929 Other chronic pain: Secondary | ICD-10-CM | POA: Insufficient documentation

## 2013-09-25 DIAGNOSIS — Z79899 Other long term (current) drug therapy: Secondary | ICD-10-CM | POA: Insufficient documentation

## 2013-09-25 DIAGNOSIS — F411 Generalized anxiety disorder: Secondary | ICD-10-CM | POA: Insufficient documentation

## 2013-09-25 DIAGNOSIS — F17200 Nicotine dependence, unspecified, uncomplicated: Secondary | ICD-10-CM | POA: Insufficient documentation

## 2013-09-25 MED ORDER — HYDROCODONE-ACETAMINOPHEN 5-325 MG PO TABS
1.0000 | ORAL_TABLET | Freq: Once | ORAL | Status: AC
  Administered 2013-09-25: 1 via ORAL
  Filled 2013-09-25: qty 1

## 2013-09-25 MED ORDER — HYDROCODONE-ACETAMINOPHEN 5-325 MG PO TABS
1.0000 | ORAL_TABLET | Freq: Once | ORAL | Status: DC
Start: 2013-09-25 — End: 2014-02-18

## 2013-09-25 MED ORDER — IBUPROFEN 800 MG PO TABS
400.0000 mg | ORAL_TABLET | Freq: Two times a day (BID) | ORAL | Status: DC | PRN
Start: 2013-09-25 — End: 2014-01-09

## 2013-09-25 NOTE — ED Notes (Signed)
Multiple complaints. Having lower back pain, bilateral knee pain and right ankle pain. Hx of same, requesting cortisone injections for his knees. Ambulatory at triage.

## 2013-09-25 NOTE — Discharge Instructions (Signed)
Arthralgia  Your caregiver has diagnosed you as suffering from an arthralgia. Arthralgia means there is pain in a joint. This can come from many reasons including:  · Bruising the joint which causes soreness (inflammation) in the joint.  · Wear and tear on the joints which occur as we grow older (osteoarthritis).  · Overusing the joint.  · Various forms of arthritis.  · Infections of the joint.  Regardless of the cause of pain in your joint, most of these different pains respond to anti-inflammatory drugs and rest. The exception to this is when a joint is infected, and these cases are treated with antibiotics, if it is a bacterial infection.  HOME CARE INSTRUCTIONS   · Rest the injured area for as long as directed by your caregiver. Then slowly start using the joint as directed by your caregiver and as the pain allows. Crutches as directed may be useful if the ankles, knees or hips are involved. If the knee was splinted or casted, continue use and care as directed. If an stretchy or elastic wrapping bandage has been applied today, it should be removed and re-applied every 3 to 4 hours. It should not be applied tightly, but firmly enough to keep swelling down. Watch toes and feet for swelling, bluish discoloration, coldness, numbness or excessive pain. If any of these problems (symptoms) occur, remove the ace bandage and re-apply more loosely. If these symptoms persist, contact your caregiver or return to this location.  · For the first 24 hours, keep the injured extremity elevated on pillows while lying down.  · Apply ice for 15-20 minutes to the sore joint every couple hours while awake for the first half day. Then 03-04 times per day for the first 48 hours. Put the ice in a plastic bag and place a towel between the bag of ice and your skin.  · Wear any splinting, casting, elastic bandage applications, or slings as instructed.  · Only take over-the-counter or prescription medicines for pain, discomfort, or fever as  directed by your caregiver. Do not use aspirin immediately after the injury unless instructed by your physician. Aspirin can cause increased bleeding and bruising of the tissues.  · If you were given crutches, continue to use them as instructed and do not resume weight bearing on the sore joint until instructed.  Persistent pain and inability to use the sore joint as directed for more than 2 to 3 days are warning signs indicating that you should see a caregiver for a follow-up visit as soon as possible. Initially, a hairline fracture (break in bone) may not be evident on X-rays. Persistent pain and swelling indicate that further evaluation, non-weight bearing or use of the joint (use of crutches or slings as instructed), or further X-rays are indicated. X-rays may sometimes not show a small fracture until a week or 10 days later. Make a follow-up appointment with your own caregiver or one to whom we have referred you. A radiologist (specialist in reading X-rays) may read your X-rays. Make sure you know how you are to obtain your X-ray results. Do not assume everything is normal if you do not hear from us.  SEEK MEDICAL CARE IF:  Bruising, swelling, or pain increases.  SEEK IMMEDIATE MEDICAL CARE IF:   · Your fingers or toes are numb or blue.  · The pain is not responding to medications and continues to stay the same or get worse.  · The pain in your joint becomes severe.  · You   develop a fever over 102° F (38.9° C).  · It becomes impossible to move or use the joint.  MAKE SURE YOU:   · Understand these instructions.  · Will watch your condition.  · Will get help right away if you are not doing well or get worse.  Document Released: 06/12/2005 Document Revised: 09/04/2011 Document Reviewed: 01/29/2008  [employer]® Patient Information ©2014 [employer].

## 2013-09-25 NOTE — ED Provider Notes (Signed)
CSN: [id number]     Arrival date & time 09/25/13  1522 History  This chart was scribed for non-physician practitioner, Marlon Peliffany Obbie Lewallen, PA-C working with Gilda Creasehristopher J. Pollina, MD by Greggory StallionKayla Andersen, ED scribe. This patient was seen in room TR06C/TR06C and the patient's care was started at 5:41 PM.   Chief Complaint  Patient presents with  . Back Pain  . Knee Pain   The history is provided by the patient. No language interpreter was used.   HPI Comments: Kevin Hall is a 43 y.o. male who presents to the Emergency Department complaining of worsening right ankle pain. Pt states he has been diagnosed with achilles tendonitis in the past. He is also complaining of bilateral knee pain. Pt has history of a torn meniscus and has had surgery in his right knee but not his left. He has seen an orthopedist in the past where cortisone shots were done and is requesting them today. Pt was told not to wear a knee brace in the past so his knee doesn't become dependent on it. He has taken 800 mg ibuprofen with little relief.   Past Medical History  Diagnosis Date  . Anxiety    Past Surgical History  Procedure Laterality Date  . Knee arthroscopy    . Hernia repair    . Tonsillectomy and adenoidectomy     Family History  Problem Relation Age of Onset  . Anxiety disorder Mother   . Cancer - Lung Father   . Alcohol abuse Father   . Bipolar disorder Brother    History  Substance Use Topics  . Smoking status: Current Every Day Smoker  . Smokeless tobacco: Not on file  . Alcohol Use: 1.2 oz/week    2 Cans of beer per week     Comment: a few beers every month     Review of Systems  Musculoskeletal: Positive for arthralgias and back pain.  All other systems reviewed and are negative.   Allergies  Review of patient's allergies indicates no known allergies.  Home Medications   Current Outpatient Rx  Name  Route  Sig  Dispense  Refill  . clonazePAM (KLONOPIN) 0.5 MG disintegrating tablet    Oral   Take 0.5 mg by mouth 3 (three) times daily.         Marland Kitchen. ibuprofen (ADVIL,MOTRIN) 800 MG tablet   Oral   Take 400-800 mg by mouth 2 (two) times daily as needed. Takes 800mg  in the morning, and two tablets in the evening         . HYDROcodone-acetaminophen (NORCO/VICODIN) 5-325 MG per tablet   Oral   Take 1-2 tablets by mouth once.   20 tablet   0    BP 112/77  Pulse 86  Temp(Src) 97.9 F (36.6 C) (Oral)  Resp 20  Ht 5\' 9"  (1.753 m)  Wt 165 lb (74.844 kg)  BMI 24.36 kg/m2  SpO2 99%  Physical Exam  Nursing note and vitals reviewed. Constitutional: He is oriented to person, place, and time. He appears well-developed and well-nourished. No distress.  HENT:  Head: Normocephalic and atraumatic.  Eyes: EOM are normal.  Neck: Neck supple. No tracheal deviation present.  Cardiovascular: Normal rate.   Pulmonary/Chest: Effort normal. No respiratory distress.  Musculoskeletal: Normal range of motion.       Left knee: He exhibits normal range of motion, no swelling, no ecchymosis and no deformity. No tenderness found.       Legs: No rash, induration, erythema or deformities  to joints. Pulses are symmetrical  Neurological: He is alert and oriented to person, place, and time.  Skin: Skin is warm and dry.  Psychiatric: He has a normal mood and affect. His behavior is normal.    ED Course  Procedures (including critical care time)  DIAGNOSTIC STUDIES: Oxygen Saturation is 99% on RA, normal by my interpretation.    COORDINATION OF CARE: 5:52 PM-Discussed treatment plan which includes Vicodin with pt at bedside and pt agreed to plan. Explained to pt that cortisone shots can not be done in the ED setting but will give him orthopedic referrals.   Labs Review Labs Reviewed - No data to display Imaging Review No results found.   EKG Interpretation None      MDM   Final diagnoses:  Chronic joint pain    42 y.o.Norm Salt evaluation in the Emergency Department  is complete. It has been determined that no acute conditions requiring further emergency intervention are present at this time. The patient/guardian have been advised of the diagnosis and plan. We have discussed signs and symptoms that warrant return to the ED, such as changes or worsening in symptoms.  Vital signs are stable at discharge. Filed Vitals:   09/25/13 1522  BP: 112/77  Pulse: 86  Temp: 97.9 F (36.6 C)  Resp: 20    Patient/guardian has voiced understanding and agreed to follow-up with the PCP or specialist.   I personally performed the services described in this documentation, which was scribed in my presence. The recorded information has been reviewed and is accurate.  Dorthula Matas, PA-C 09/25/13 1804

## 2013-09-27 NOTE — ED Provider Notes (Signed)
Medical screening examination/treatment/procedure(s) were performed by non-physician practitioner and as supervising physician I was immediately available for consultation/collaboration.   EKG Interpretation None        Natisha Trzcinski J. Lakina Mcintire, MD 09/27/13 0855 

## 2013-09-29 ENCOUNTER — Telehealth (HOSPITAL_COMMUNITY): Payer: Self-pay

## 2013-09-29 NOTE — Telephone Encounter (Signed)
08/29/13 Returned pt's call - pt requested a copy of the letter that was faxed over to [employer] [facility] - mailed a copy as requested.Marland Kitchen.Marguerite Olea/sh

## 2013-10-06 ENCOUNTER — Telehealth (HOSPITAL_COMMUNITY): Payer: Self-pay | Admitting: *Deleted

## 2013-10-06 ENCOUNTER — Ambulatory Visit (INDEPENDENT_AMBULATORY_CARE_PROVIDER_SITE_OTHER): Payer: Federal, State, Local not specified - Other | Admitting: Psychiatry

## 2013-10-06 DIAGNOSIS — F4323 Adjustment disorder with mixed anxiety and depressed mood: Secondary | ICD-10-CM

## 2013-10-06 NOTE — Telephone Encounter (Signed)
Patient left AV:[id number]:Needs authorization for klonopin to have it filled.Will be there at 1p today. Contacted [facility].Per pharmacist Jim: Medicaid will not pay for dissolvable tablets due to cost. Patient has refill remaining on current prescription, can be filled if can be changed to non-dissolvable tablets.  Patient here for 1 pm appt.He states he has no problems swallowing tablets, just got the dissolvable ones as that is what MD prescribed.   Contacted [facility]:YNWGCVS:Gave Jim authorization to fill remaining Klonopin refill with non-dissolvable tablets. Notified pt after appt.

## 2013-10-06 NOTE — Progress Notes (Signed)
   THERAPIST PROGRESS NOTE  Session Time: 1:00pm-1:50pm   Participation Level: Active   Behavioral Response: Well GroomedAlertAnxious and Irritable   Type of Therapy: Individual Therapy   Treatment Goals addressed: Anxiety and Coping   Interventions: CBT, Solution Focused, Strength-based, Supportive and Reframing   Summary: Kevin Hall is a 43 y.o. male who presents with anxious mood and agitated affect. Pt. Continues to report challenged associated with knee pain, limited mobility, and financial stress. Pt. Presents as irritable and defensive. Pt. Demonstrates limited ability to focus on goal setting and perceives that he is limited in his ability to pursue vocational rehabilitation and coping behaviors because of limited finances. Pt. Reports that he destroyed his guitar recently due to angry outburst towards his mother because he does not believe that she has been supportive of his music interests. Pt. Is able to identify his relationship with his 44 year old son as a significant source of strength and is able to manage his self-care so that he can take care of his son.    Suicidal/Homicidal: Nowithout intent/plan   Therapist Response: Assessed patients current functioning and reviewed progress. Reviewed coping strategies. Assessed patients safety and assisted in identifying protective factors.Assisted patient with the expression of anxiety and frustration . Reviewed patients self care plan. Assessed progress related to self care.Used CBT to assist patient with the identification of negative distortions and irrational thoughts. Encouraged patient to verbalize alternative and factual responses which challenge thought distortions.   Plan: Pt. To continue with CBT based therapy. Return again in 4-6 weeks.   Diagnosis: Axis I: Adjustment Disorder with Mixed Emotional Features   Axis II: No diagnosis    Wynonia MustyBrown, Jennifer B, COUNS 10/06/2013

## 2013-10-10 ENCOUNTER — Ambulatory Visit (INDEPENDENT_AMBULATORY_CARE_PROVIDER_SITE_OTHER): Payer: Federal, State, Local not specified - Other | Admitting: Psychiatry

## 2013-10-10 VITALS — BP 122/79 | HR 69 | Ht 69.0 in | Wt 175.0 lb

## 2013-10-10 DIAGNOSIS — F4323 Adjustment disorder with mixed anxiety and depressed mood: Secondary | ICD-10-CM

## 2013-10-10 DIAGNOSIS — F4322 Adjustment disorder with anxiety: Secondary | ICD-10-CM

## 2013-10-10 MED ORDER — CLONAZEPAM 0.5 MG PO TBDP
0.5000 mg | ORAL_TABLET | Freq: Three times a day (TID) | ORAL | Status: DC
Start: 2013-10-10 — End: 2013-11-24

## 2013-10-10 NOTE — Progress Notes (Signed)
[facility]  10/10/2013 11:20 AM Kevin SizerJoshua Hall  MRN:  [id number] Subjective: Okay At this time this patient is stable. He seems to function fairly well. He takes care of his 44-year-old son regularly. The patient is not dating. The patient denies daily depression. He is thinking concentrating problem. His anxiety is well controlled taking Klonopin 0.5 3 times a day. The patient is having more physical problems including some problems with his Achilles tendon. He has chronic right knee problems and sciatica problems. He is aggressive in trying to get disability for his physical limitations. 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 Unfortunately he is very limited. He now is seeing a disability lawyer to try to facilitate disability. His mother recently had surgery but she is doing well. He lives with his mother and his 44-year-old son. The patient recently got upset with his guitar and now has a new bedtime. Overall the patient does not drink or use drugs. He seems to be stable. He demonstrates no psychosis. Is not suicidal. Diagnosis:   DSM5: Schizophrenia Disorders:   Obsessive-Compulsive Disorders:   Trauma-Stressor Disorders:   Substance/Addictive Disorders:   Depressive Disorders:   Total Time spent with patient:   Axis I: Adjustment Disorder with Anxiety  ADL's:  Intact  Sleep: Good  Appetite:  Good  Suicidal Ideation:  no Homicidal Ideation:  none AEB (as evidenced by):  Psychiatric Specialty Exam: Physical Exam  ROS  There were no vitals taken for this visit.There is no weight on file to calculate BMI.  General Appearance: Casual  Eye Contact::  Good  Speech:  Clear and Coherent  Volume:  Normal  Mood:  Euthymic  Affect:  Appropriate  Thought Process:  Coherent  Orientation:  Full (Time, Place, and Person)  Thought Content:  WDL  Suicidal Thoughts:  No  Homicidal Thoughts:  No  Memory:  NA   Judgement:  Good  Insight:  Fair  Psychomotor Activity:  Normal  Concentration:  Good  Recall:  Good  Fund of Knowledge:Good  Language: Good  Akathisia:  No  Handed:  Right  AIMS (if indicated):     Assets:  Communication Skills  Sleep:      Musculoskeletal: Strength & Muscle Tone:  Gait & Station:  Patient leans:   Current Medications: Current Outpatient Prescriptions  Medication Sig Dispense Refill  . clonazePAM (KLONOPIN) 0.5 MG disintegrating tablet Take 1 tablet (0.5 mg total) by mouth 3 (three) times daily.  90 tablet  5  . HYDROcodone-acetaminophen (NORCO/VICODIN) 5-325 MG per tablet Take 1-2 tablets by mouth once.  20 tablet  0  . ibuprofen (ADVIL,MOTRIN) 800 MG tablet Take 0.5-1 tablets (400-800 mg total) by mouth 2 (two) times daily as needed. Takes 800mg  in the morning, and two tablets in the evening  30 tablet  0   No current facility-administered medications for this visit.    Lab Results: No results found for this or any previous visit (from the past 48 hour(s)).  Physical Findings: AIMS:  , ,  ,  ,    CIWA:    COWS:     Treatment Plan Summary: Today the patient is stable. The slight increase her Klonopin to 0.5 mg 3 times a day has apparently been helpful. The patient was interested me writing a letter but is not clear what that would be about. He is well aware that I do not believe he has any  psychiatric illnesses persistent. I think is a transient adjustment disorder and his next visit we will talk about the need to start reducing his Klonopin with the hope of a limited it within a year.  Plan:  Medical Decision Making Problem Points:  Established problem, worsening (2) Data Points:  Review of medication regiment & side effects (2)  I certify that inpatient services furnished can reasonably be expected to improve the patient's condition.   Earvin HansenGerald Tianna Baus 10/10/2013, 11:20 AM

## 2013-10-17 ENCOUNTER — Ambulatory Visit (HOSPITAL_COMMUNITY): Payer: Self-pay | Admitting: Psychiatry

## 2013-11-24 ENCOUNTER — Telehealth (HOSPITAL_COMMUNITY): Payer: Self-pay | Admitting: *Deleted

## 2013-11-24 DIAGNOSIS — F4323 Adjustment disorder with mixed anxiety and depressed mood: Secondary | ICD-10-CM

## 2013-11-24 MED ORDER — CLONAZEPAM 0.5 MG PO TABS: 0.5000 mg | ORAL_TABLET | Freq: Three times a day (TID) | ORAL | Status: DC | PRN

## 2013-11-24 NOTE — Telephone Encounter (Signed)
Patient states RX was taken to [facility] on [address] by brother, not the [facility]. Contacted [facility] -  Replacement for RX dated 10/10/13-taken by patient to pharmacy 11/23/13. Verbal RX given to [place] at [facility].  Changed from ODT at patient request as ODT required authorization and patient does not need ODT-can swallow pills.

## 2013-12-05 ENCOUNTER — Ambulatory Visit (INDEPENDENT_AMBULATORY_CARE_PROVIDER_SITE_OTHER): Payer: Federal, State, Local not specified - Other | Admitting: Psychiatry

## 2013-12-05 DIAGNOSIS — F4323 Adjustment disorder with mixed anxiety and depressed mood: Secondary | ICD-10-CM

## 2013-12-05 DIAGNOSIS — F988 Other specified behavioral and emotional disorders with onset usually occurring in childhood and adolescence: Secondary | ICD-10-CM

## 2013-12-05 NOTE — Progress Notes (Signed)
   THERAPIST PROGRESS NOTE Session Time: 3:00-3:50pm   Participation Level: Active   Behavioral Response: Well GroomedAlertAnxious and Irritable   Type of Therapy: Individual Therapy   Treatment Goals addressed: Anxiety and Coping   Interventions: CBT, Solution Focused, Strength-based, Supportive and Reframing   Summary: Kevin Hall is a 43 y.o. male who continues to present with anxious mood and agitated affect. Pt. Complains of pain in both achilles tendons and walking is labored with a boot on one foot and the other ankle wrapped. Pt. Maintains a very strong bond with his 44 year old son, demonstrates protective attitude towards him with some fear of allowing outside influences and potential supports. Pt. Processes anger towards his son's mother and difficulty demonstrating understanding or empathy towards her and what might have motivated some of her choices. Pt. 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   Suicidal/Homicidal: Nowithout intent/plan   Therapist Response: Assessed patients current functioning and reviewed progress. Reviewed coping strategies. Assessed patients safety and assisted in identifying protective factors. Assisted patient with the expression of anxiety and frustration .Assessed progress related to self care.Used CBT to assist patient with the identification of negative distortions and irrational thoughts. Encouraged patient to verbalize alternative and factual responses which challenge thought distortions.   Plan: Pt. To continue with CBT based therapy. Return again in 4-6 weeks.   Diagnosis: Axis I: Adjustment Disorder with Mixed Emotional Features   Axis II: No diagnosis     Wynonia MustyBrown, Royalti Schauf B, COUNS 12/05/2013

## 2013-12-29 ENCOUNTER — Ambulatory Visit (INDEPENDENT_AMBULATORY_CARE_PROVIDER_SITE_OTHER): Payer: Medicaid Other | Admitting: Family Medicine

## 2013-12-29 ENCOUNTER — Encounter: Payer: Self-pay | Admitting: Family Medicine

## 2013-12-29 VITALS — BP 120/70 | HR 84 | Temp 98.4°F | Ht 70.0 in | Wt 176.0 lb

## 2013-12-29 DIAGNOSIS — Z5181 Encounter for therapeutic drug level monitoring: Secondary | ICD-10-CM

## 2013-12-29 DIAGNOSIS — M5430 Sciatica, unspecified side: Secondary | ICD-10-CM | POA: Insufficient documentation

## 2013-12-29 DIAGNOSIS — Z0000 Encounter for general adult medical examination without abnormal findings: Secondary | ICD-10-CM | POA: Diagnosis not present

## 2013-12-29 DIAGNOSIS — F17200 Nicotine dependence, unspecified, uncomplicated: Secondary | ICD-10-CM

## 2013-12-29 DIAGNOSIS — Z720 Tobacco use: Secondary | ICD-10-CM | POA: Insufficient documentation

## 2013-12-29 DIAGNOSIS — Z23 Encounter for immunization: Secondary | ICD-10-CM

## 2013-12-29 NOTE — Patient Instructions (Signed)
Nice to meet you. You appear to be doing well. If you want help quitting smoking please let us know. We will contact you with the results of your labs.

## 2013-12-29 NOTE — Addendum Note (Signed)
Addended by: Glori LuisSONNENBERG, Cielo Arias G on: 12/29/2013 08:42 PM   Modules accepted: Level of Service

## 2013-12-29 NOTE — Progress Notes (Signed)
Patient ID: Kevin Hall, male   DOB: 1970/06/30, 43 y.o.   MRN: [id number]  Kevin AlarEric Lipa Knauff, MD Phone: [phone]  Kevin Hall is a 43 y.o. male who presents today for new patient appointment.  Patient presents as a new patient. He notes no concerns today other than wanting to make sure he is healthy. He notes recently he had issues with achilles tendon and just came out of a cam walker. He is being followed by [facility] for this issue.  Soc Hx: Patient is a smoker. Rarely drinks alcohol. Denies recreational drug use. He notes he has custody of his son. His wife left them about a year ago.  PMH: anxiety, depression (followed by behavioral health)  Medications: klonipin 0.5 mg TID, relafen 500 mg BID  Patient Information Form: Screening and ROS  Do you feel safe in relationships? Yes.   PHQ-2:negative  Review of Symptoms  General:  Negative fever CV:  Negative for chest pain, dyspnea, edema, palpitations Resp: Negative for cough, dyspnea GI: Negative for nausea, abdominal pain MSK: positive for right achilles pain and right knee pain Neuro: Negative for headaches Psych: Negative for depression  Physical Exam Filed Vitals:   12/29/13 1116  BP: 120/70  Pulse: 84  Temp: 98.4 F (36.9 C)   Gen: Well NAD HEENT: PERRL,  MMM Lungs: CTABL Nl WOB Heart: RRR no MRG Abd: NABS, NT, ND Exts: Non edematous BL  LE, warm and well perfused.   Assessment/Plan: Please see individual problem list.  # Healthcare maintenance: tdap given today

## 2013-12-29 NOTE — Assessment & Plan Note (Signed)
Patient with normal range BP. Will check lipid panel and cmet today. Given Tdap today. Discussed smoking cessation and patient declined intervention at this time. Discussed obtaining HIV screening test and patient declined. F/u prn.

## 2013-12-30 LAB — COMPREHENSIVE METABOLIC PANEL
ALBUMIN: 4.6 g/dL (ref 3.5–5.2)
ALK PHOS: 56 U/L (ref 39–117)
ALT: 13 U/L (ref 0–53)
AST: 16 U/L (ref 0–37)
BUN: 17 mg/dL (ref 6–23)
CO2: 24 mEq/L (ref 19–32)
Calcium: 9 mg/dL (ref 8.4–10.5)
Chloride: 104 mEq/L (ref 96–112)
Creat: 0.75 mg/dL (ref 0.50–1.35)
Glucose, Bld: 89 mg/dL (ref 70–99)
POTASSIUM: 4.4 meq/L (ref 3.5–5.3)
SODIUM: 138 meq/L (ref 135–145)
TOTAL PROTEIN: 6.7 g/dL (ref 6.0–8.3)
Total Bilirubin: 0.4 mg/dL (ref 0.2–1.2)

## 2013-12-30 LAB — LIPID PANEL
Cholesterol: 267 mg/dL — ABNORMAL HIGH (ref 0–200)
HDL: 44 mg/dL (ref >39)
LDL CALC: 164 mg/dL — AB (ref 0–99)
Total CHOL/HDL Ratio: 6.1 Ratio
Triglycerides: 294 mg/dL — ABNORMAL HIGH (ref <150)
VLDL: 59 mg/dL — ABNORMAL HIGH (ref 0–40)

## 2013-12-31 ENCOUNTER — Telehealth: Payer: Self-pay | Admitting: *Deleted

## 2013-12-31 ENCOUNTER — Other Ambulatory Visit: Payer: Self-pay | Admitting: Family Medicine

## 2013-12-31 DIAGNOSIS — E785 Hyperlipidemia, unspecified: Secondary | ICD-10-CM

## 2013-12-31 MED ORDER — ATORVASTATIN CALCIUM 40 MG PO TABS: 40.0000 mg | ORAL_TABLET | Freq: Every day | ORAL | Status: DC

## 2013-12-31 NOTE — Telephone Encounter (Signed)
Message copied by Henri MedalHARTSELL, JAZMIN M on Wed Dec 31, 2013  9:12 AM ------      Message from: Birdie SonsSONNENBERG, ERIC G      Created: Wed Dec 31, 2013  9:02 AM       Please inform the patient that his cholesterol is elevated. I would like to start him on a medication call lipitor to help decrease his cholesterol. Thanks. ------

## 2013-12-31 NOTE — Telephone Encounter (Signed)
LM for patient to call back.  Please inform of message when he does.  Thanks Limited BrandsJazmin Priscilla Kirstein,CMA

## 2013-12-31 NOTE — Telephone Encounter (Signed)
Please call patient's primary # [phone]. Home phone # is mother's.

## 2013-12-31 NOTE — Telephone Encounter (Signed)
Pt is aware of results and would like to try something else besides medication.  He doesn't eat bad and exercises already.  Would like to discuss other options. Jazmin Hartsell,CMA

## 2014-01-01 ENCOUNTER — Telehealth: Payer: Self-pay | Admitting: Family Medicine

## 2014-01-01 NOTE — Telephone Encounter (Signed)
In the past there was a portion of the lipid panel (triglycerides) that was thought to be effected by eating, though this was recently found to not be effected by eating prior to the lipid panel. Even if this was still the case, the portion of his lipid panel that was elevated is not changed by eating. So it would be of no benefit to repeat the lipid panel. Given that he states he is already dieting and exercising, the next step would be to add a statin. Given that I am post-call today I will plan on calling the patient tomorrow to discuss this.

## 2014-01-01 NOTE — Telephone Encounter (Signed)
Pt called and has decided to make an appointment to has his lab work re-done. He feels that it is a error and he didn't fast before coming to his appointment. He will make a decision on the medication at that time. jw

## 2014-01-01 NOTE — Telephone Encounter (Signed)
Has appt on 01/09/14 with MD.  Medicaid will not pay for him to have a lipid redone, this will roughly be around $100 that patient will be responsible for.  Will forward to MD Yaiza Palazzola, Maryjo RochesterJessica Dawn

## 2014-01-02 ENCOUNTER — Ambulatory Visit (INDEPENDENT_AMBULATORY_CARE_PROVIDER_SITE_OTHER): Payer: Federal, State, Local not specified - Other | Admitting: Psychiatry

## 2014-01-02 DIAGNOSIS — F988 Other specified behavioral and emotional disorders with onset usually occurring in childhood and adolescence: Secondary | ICD-10-CM

## 2014-01-02 DIAGNOSIS — F4323 Adjustment disorder with mixed anxiety and depressed mood: Secondary | ICD-10-CM

## 2014-01-02 NOTE — Progress Notes (Signed)
   THERAPIST PROGRESS NOTE   Session Time: 3:00-3:30pm   Participation Level: Active   Behavioral Response: Well GroomedAlertAnxious and Irritable   Type of Therapy: Individual Therapy   Treatment Goals addressed: Anxiety and Coping   Interventions: CBT, Solution Focused, Strength-based, Supportive and Reframing   Summary: Kevin SizerJoshua Hall is a 43 y.o. male who continues to present with anxious mood and agitated affect. Pt requested 30 minute session due to bus schedule. Pt. Continues to cite his 44 year old son and mother as significant sources of emotional support. Pt. Reports that he continues to have tremendous achilles tendon pain and anger toward the doctor who performed his surgery in 2009. Pt. Reports that he talks to his mother when he feels anger. Pt. Reports that he has not been able to walk to the park to spend time with his son which is a loss; however, he has been able to go to the pool and swim which has been helpful for developing socialization and physical activity. Pt. Describes strong sense of loyalty and responsibility for the care of his son and mother and is highly motivated to provide care and support for his family. Pt. Demonstrated progress related to his openness to have a romantic relationship when he feels more financially and physically stable.  Suicidal/Homicidal: Nowithout intent/plan   Therapist Response: Assessed patients current functioning and reviewed progress. Reviewed coping strategies. Assessed patients safety and assisted in identifying protective factors. Assisted patient with the expression of anxiety and frustration .Assessed progress related to self care.Used CBT to assist patient with the identification of negative distortions and irrational thoughts. Encouraged patient to verbalize alternative and factual responses which challenge thought distortions.   Plan: Pt. To continue with CBT based therapy. Return again in 4-6 weeks.   Diagnosis: Axis I:  Adjustment Disorder with Mixed Emotional Features   Axis II: No diagnosis   Wynonia MustyBrown, Joliyah Lippens B, COUNS 01/02/2014

## 2014-01-05 NOTE — Telephone Encounter (Signed)
Called patient to discusses cholesterol. Patient has decreased his activity level since his achilles tendon injury and would like to try increasing his activity level to see if this helps with his cholesterol prior to starting medication. I advised that this would be an option, though if his cholesterol did not improve with this increase in activity level, we would need to start on a medication to help with this issue.   Patient additionally stated that he was having pain in his already injured achilles tendon. He has been alternating between the boot and tennis shoe as his orthopedist advised and he has had pain in this area when he does not wear the boot. I advised that the patient call the orthopedists office to discuss this and he declined this options stating that they would not help him with his pain as they have not helped with this previously. I advised that he could make an appointment for a same day visit in the next couple of days or he could keep his appointment this Friday to discuss this issue. He chose to keep fridays appointment.

## 2014-01-09 ENCOUNTER — Telehealth: Payer: Self-pay | Admitting: *Deleted

## 2014-01-09 ENCOUNTER — Encounter: Payer: Self-pay | Admitting: Family Medicine

## 2014-01-09 ENCOUNTER — Ambulatory Visit (INDEPENDENT_AMBULATORY_CARE_PROVIDER_SITE_OTHER): Payer: Medicaid Other | Admitting: Family Medicine

## 2014-01-09 VITALS — BP 118/79 | HR 86 | Temp 98.1°F | Wt 178.0 lb

## 2014-01-09 DIAGNOSIS — M25561 Pain in right knee: Principal | ICD-10-CM

## 2014-01-09 DIAGNOSIS — M25569 Pain in unspecified knee: Secondary | ICD-10-CM

## 2014-01-09 DIAGNOSIS — M545 Low back pain, unspecified: Secondary | ICD-10-CM | POA: Insufficient documentation

## 2014-01-09 DIAGNOSIS — E785 Hyperlipidemia, unspecified: Secondary | ICD-10-CM

## 2014-01-09 DIAGNOSIS — G8929 Other chronic pain: Secondary | ICD-10-CM

## 2014-01-09 MED ORDER — TRAMADOL HCL 50 MG PO TABS: 50.0000 mg | ORAL_TABLET | Freq: Three times a day (TID) | ORAL | Status: DC | PRN

## 2014-01-09 MED ORDER — IBUPROFEN 800 MG PO TABS: 800.0000 mg | ORAL_TABLET | Freq: Three times a day (TID) | ORAL | Status: DC | PRN

## 2014-01-09 MED ORDER — IBUPROFEN 800 MG PO TABS: 400.0000 mg | ORAL_TABLET | Freq: Two times a day (BID) | ORAL | Status: DC | PRN

## 2014-01-09 NOTE — Patient Instructions (Signed)
Nice to see you. We will send you to pain management. They should contact you with an appointment. Please do the exercises that I have provided each day.

## 2014-01-09 NOTE — Addendum Note (Signed)
Addended by: Glori LuisSONNENBERG, ERIC G on: 01/09/2014 04:44 PM   Modules accepted: Orders

## 2014-01-09 NOTE — Telephone Encounter (Signed)
Please call [facility] pharmacy to clarify dosage and direction of ibuprofen (ADVIL,MOTRIN) 800 MG tablet:Take 0.5-1 tablets (400-800 mg total) by mouth 2 (two) times daily as needed. Takes 800mg  in the morning, and two tablets in the evening.  Clovis PuMartin, Seab Axel L, RN

## 2014-01-09 NOTE — Progress Notes (Signed)
Patient ID: Kevin Hall, male   DOB: January 30, 1971, 43 y.o.   MRN: [id number]  Marikay AlarEric Stephen Baruch, MD Phone: [phone]  Kevin Hall is a 43 y.o. male who presents today for f/u.  Low back pain: patient complains to back pain that has been present over the past year, though he has not seen anyone about this. It feels like a pinch in his lower back. States this has gotten worse since he had the issue with his right achilles tendon. He describes "sciatic episodes" as occurring "like a charlie horse" in his right buttock. He notes the pinching is worse with bending over. He denies any weakness in his LE, saddle anesthesia, or changes in bowel or bladder function. He has tried relafen for this, though this has not been beneficial.   He notes that he sees ortho for his right knee. He described previously having had a meniscal removal on the right knee several years ago and since that time he had pain in that knee. He requested that ortho send him to pain management, though they told him he needed to come to his PCP for this.  Patient is a smoker.   ROS: Per HPI   Physical Exam Filed Vitals:   01/09/14 0950  BP: 118/79  Pulse: 86  Temp: 98.1 F (36.7 C)   Gen: Well NAD HEENT: PERRL,  MMM Lungs: CTABL Nl WOB Heart: RRR no MRG MSK: no midline back tenderness to palpation, no lumbar spine tenderness, no swelling noted Neuro: patient is able to do a deep squat, stand on toes, and stand on heels without difficulty, sensation to light touch is intact, 2+ patellar reflexes Exts: Non edematous BL  LE, warm and well perfused.    Assessment/Plan: Please see individual problem list.  # Healthcare maintenance: up to date

## 2014-01-09 NOTE — Assessment & Plan Note (Signed)
Patient with no discernable defects on exam and no neurological deficits. Suspect MSK cause at this time. Discussed PT vs home exercises. Patient opted for home exercises. Will treat pain with ibuprofen 800 mg TID PRN and tramadol 50 mg TID prn for pain. Given patient request will refer to pain management for further management of chronic pain in right knee. F/u in one month.

## 2014-01-09 NOTE — Telephone Encounter (Signed)
Changed prescription to ibuprofen 800 mg TID prn.

## 2014-01-21 ENCOUNTER — Other Ambulatory Visit: Payer: Self-pay | Admitting: Family Medicine

## 2014-01-21 ENCOUNTER — Other Ambulatory Visit: Payer: Self-pay | Admitting: *Deleted

## 2014-01-21 MED ORDER — TRAMADOL HCL 50 MG PO TABS: 50.0000 mg | ORAL_TABLET | Freq: Three times a day (TID) | ORAL | Status: DC | PRN

## 2014-01-22 NOTE — Telephone Encounter (Signed)
Please call this in  

## 2014-01-22 NOTE — Telephone Encounter (Signed)
Called prescription in to [facility].

## 2014-01-30 ENCOUNTER — Ambulatory Visit (HOSPITAL_COMMUNITY): Payer: Self-pay | Admitting: Psychiatry

## 2014-02-10 ENCOUNTER — Ambulatory Visit (HOSPITAL_COMMUNITY): Payer: Self-pay | Admitting: Psychiatry

## 2014-02-13 ENCOUNTER — Other Ambulatory Visit: Payer: Self-pay | Admitting: Family Medicine

## 2014-02-18 ENCOUNTER — Emergency Department (HOSPITAL_COMMUNITY)
Admission: EM | Admit: 2014-02-18 | Discharge: 2014-02-18 | Disposition: A | Discharge status: Home / Self Care | Payer: Medicaid Other | Attending: Emergency Medicine | Admitting: Emergency Medicine

## 2014-02-18 ENCOUNTER — Encounter (HOSPITAL_COMMUNITY): Payer: Self-pay | Admitting: Emergency Medicine

## 2014-02-18 DIAGNOSIS — W06XXXA Fall from bed, initial encounter: Secondary | ICD-10-CM | POA: Diagnosis not present

## 2014-02-18 DIAGNOSIS — Y9289 Other specified places as the place of occurrence of the external cause: Secondary | ICD-10-CM | POA: Insufficient documentation

## 2014-02-18 DIAGNOSIS — S4980XA Other specified injuries of shoulder and upper arm, unspecified arm, initial encounter: Secondary | ICD-10-CM | POA: Insufficient documentation

## 2014-02-18 DIAGNOSIS — S46909A Unspecified injury of unspecified muscle, fascia and tendon at shoulder and upper arm level, unspecified arm, initial encounter: Secondary | ICD-10-CM | POA: Insufficient documentation

## 2014-02-18 DIAGNOSIS — Z79899 Other long term (current) drug therapy: Secondary | ICD-10-CM | POA: Diagnosis not present

## 2014-02-18 DIAGNOSIS — Z791 Long term (current) use of non-steroidal anti-inflammatories (NSAID): Secondary | ICD-10-CM | POA: Insufficient documentation

## 2014-02-18 DIAGNOSIS — F17200 Nicotine dependence, unspecified, uncomplicated: Secondary | ICD-10-CM | POA: Insufficient documentation

## 2014-02-18 DIAGNOSIS — Y9389 Activity, other specified: Secondary | ICD-10-CM | POA: Diagnosis not present

## 2014-02-18 DIAGNOSIS — M791 Myalgia, unspecified site: Secondary | ICD-10-CM

## 2014-02-18 DIAGNOSIS — F411 Generalized anxiety disorder: Secondary | ICD-10-CM | POA: Insufficient documentation

## 2014-02-18 HISTORY — DX: Unspecified dislocation of unspecified shoulder joint, initial encounter: S43.006A

## 2014-02-18 MED ORDER — IBUPROFEN 800 MG PO TABS: 800.0000 mg | ORAL_TABLET | Freq: Three times a day (TID) | ORAL | Status: DC

## 2014-02-18 MED ORDER — CYCLOBENZAPRINE HCL 10 MG PO TABS
10.0000 mg | ORAL_TABLET | Freq: Once | ORAL | Status: AC
  Administered 2014-02-18: 10 mg via ORAL
  Filled 2014-02-18: qty 1

## 2014-02-18 MED ORDER — CYCLOBENZAPRINE HCL 10 MG PO TABS: 10.0000 mg | ORAL_TABLET | Freq: Three times a day (TID) | ORAL | Status: DC | PRN

## 2014-02-18 MED ORDER — IBUPROFEN 800 MG PO TABS
800.0000 mg | ORAL_TABLET | Freq: Once | ORAL | Status: AC
  Administered 2014-02-18: 800 mg via ORAL
  Filled 2014-02-18: qty 1

## 2014-02-18 NOTE — ED Notes (Signed)
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

## 2014-02-18 NOTE — ED Notes (Signed)
Pt riding the bus, made aware the risks of taking the bus with med given.  Pt verbalizes understanding

## 2014-02-18 NOTE — ED Provider Notes (Signed)
CSN: [id number]     Arrival date & time 02/18/14  1908 History  This chart was scribed for non-physician practitioner, Ivonne Andrew, PA-C working with Purvis Sheffield, MD by Luisa Dago, ED scribe. This patient was seen in room WTR5/WTR5 and the patient's care was started at 8:51 PM.      Chief Complaint  Patient presents with  . Shoulder Pain   The history is provided by the patient. No language interpreter was used.   HPI Comments: Kevin Hall is a 43 y.o. male who presents to the Emergency Department complaining of right shoulder pain that started this AM. Pt states that this morning he felt a "pull on his shoulder" by turning the wrong way. He states that he has had various episodes of dislocations to his right shoulder. Pt reports approximately 12 episodes. Mr. Coburn is right hand dominant. He states that this episode is not consistent with the past dislocation. He reports taking Relphin without any relief. Denies any fever, chills, nausea, abdominal pain, joint swelling, weakness, numbness, or paraesthesia. He denies any other pertinent medical history.   Past Medical History  Diagnosis Date  . Anxiety   . Depression   . Shoulder dislocation    Past Surgical History  Procedure Laterality Date  . Knee arthroscopy    . Hernia repair    . Tonsillectomy and adenoidectomy     Family History  Problem Relation Age of Onset  . Anxiety disorder Mother   . Depression Mother   . Diabetes Mother   . Hypertension Mother   . Thyroid disease Mother   . Cancer - Lung Father   . Alcohol abuse Father   . Cancer Father   . Bipolar disorder Brother   . Anxiety disorder Sister    History  Substance Use Topics  . Smoking status: Current Every Day Smoker -- 0.50 packs/day    Types: Cigarettes  . Smokeless tobacco: Not on file  . Alcohol Use: 1.2 oz/week    2 Cans of beer per week     Comment: a few beers every month     Review of Systems  Constitutional: Negative for fever,  chills and diaphoresis.  HENT: Negative for congestion.   Respiratory: Negative for cough and shortness of breath.   Cardiovascular: Negative for chest pain.  Gastrointestinal: Negative for nausea, vomiting and abdominal pain.  Musculoskeletal: Positive for arthralgias and myalgias. Negative for joint swelling.  Neurological: Negative for weakness and numbness.   Allergies  Review of patient's allergies indicates no known allergies.  Home Medications   Prior to Admission medications   Medication Sig Start Date End Date Taking? Authorizing Provider  clonazePAM (KLONOPIN) 0.5 MG tablet Take 1 tablet (0.5 mg total) by mouth 3 (three) times daily as needed for anxiety. 11/24/13 11/24/14  Archer Asa, MD  HYDROcodone-acetaminophen (NORCO/VICODIN) 5-325 MG per tablet Take 1-2 tablets by mouth once. 09/25/13   Tiffany Irine Seal, PA-C  ibuprofen (ADVIL,MOTRIN) 800 MG tablet Take 1 tablet (800 mg total) by mouth every 8 (eight) hours as needed. Takes  in the morning, and two tablets in the evening 01/09/14   Glori Luis, MD  nabumetone (RELAFEN) 500 MG tablet Take 500 mg by mouth 2 (two) times daily as needed for mild pain.    Historical Provider, MD  traMADol (ULTRAM) 50 MG tablet Take 1 tablet (50 mg total) by mouth every 8 (eight) hours as needed. 01/21/14   Glori Luis, MD   BP 138/76  Pulse  96  Temp(Src) 97.7 F (36.5 C) (Oral)  Resp 20  Ht  (1.753 m)  Wt 173 lb (78.472 kg)  BMI 25.54 kg/m2  SpO2 97%  Physical Exam  Nursing note and vitals reviewed. Constitutional: He is oriented to person, place, and time. He appears well-developed and well-nourished. No distress.  HENT:  Head: Normocephalic and atraumatic.  Eyes: Conjunctivae and EOM are normal.  Neck: Neck supple. No thyromegaly present.  Cardiovascular: Normal rate.   Pulmonary/Chest: Effort normal. No respiratory distress.  Musculoskeletal: Normal range of motion. He exhibits tenderness. He exhibits no edema.   Mild tenderness over the right upper trapezius and rhomboid area of the shoulder. No deformities or spasming.  Lymphadenopathy:    He has no cervical adenopathy.  Neurological: He is alert and oriented to person, place, and time.  Skin: Skin is warm and dry.  Psychiatric: He has a normal mood and affect. His behavior is normal.    ED Course  Procedures   DIAGNOSTIC STUDIES: Oxygen Saturation is 97% on RA, adequate by my interpretation.    COORDINATION OF CARE: 8:56 PM- Patient seen and evaluated. No injury or trauma. Full range of motion of the right shoulder area. No significant exam findings for concerning cause of pain. Will prescribe muscle relaxants. Pt is requesting Ibuprofen 800 mg. Pt advised of plan for treatment and pt agrees.   MDM   Final diagnoses:  Muscle pain      I personally performed the services described in this documentation, which was scribed in my presence. The recorded information has been reviewed and is accurate.    Angus Seller, PA-C 02/18/14 2130

## 2014-02-18 NOTE — Discharge Instructions (Signed)
Your providers do not feel you have any emergent condition. Please followup with your primary care provider or an orthopedic specialist for your muscle soreness.    Muscle Pain Muscle pain (myalgia) may be caused by many things, including:  Overuse or muscle strain, especially if you are not in shape. This is the most common cause of muscle pain.  Injury.  Bruises.  Viruses, such as the flu.  Infectious diseases.  Fibromyalgia, which is a chronic condition that causes muscle tenderness, fatigue, and headache.  Autoimmune diseases, including lupus.  Certain drugs, including ACE inhibitors and statins. Muscle pain may be mild or severe. In most cases, the pain lasts only a short time and goes away without treatment. To diagnose the cause of your muscle pain, your health care provider will take your medical history. This means he or she will ask you when your muscle pain began and what has been happening. If you have not had muscle pain for very long, your health care provider may want to wait before doing much testing. If your muscle pain has lasted a long time, your health care provider may want to run tests right away. If your health care provider thinks your muscle pain may be caused by illness, you may need to have additional tests to rule out certain conditions.  Treatment for muscle pain depends on the cause. Home care is often enough to relieve muscle pain. Your health care provider may also prescribe anti-inflammatory medicine. HOME CARE INSTRUCTIONS Watch your condition for any changes. The following actions may help to lessen any discomfort you are feeling:  Only take over-the-counter or prescription medicines as directed by your health care provider.  Apply ice to the sore muscle:  Put ice in a plastic bag.  Place a towel between your skin and the bag.  Leave the ice on for 15-20 minutes, 3-4 times a day.  You may alternate applying hot and cold packs to the muscle as  directed by your health care provider.  If overuse is causing your muscle pain, slow down your activities until the pain goes away.  Remember that it is normal to feel some muscle pain after starting a workout program. Muscles that have not been used often will be sore at first.  Do regular, gentle exercises if you are not usually active.  Warm up before exercising to lower your risk of muscle pain.  Do not continue working out if the pain is very bad. Bad pain could mean you have injured a muscle. SEEK MEDICAL CARE IF:  Your muscle pain gets worse, and medicines do not help.  You have muscle pain that lasts longer than 3 days.  You have a rash or fever along with muscle pain.  You have muscle pain after a tick bite.  You have muscle pain while working out, even though you are in good physical condition.  You have redness, soreness, or swelling along with muscle pain.  You have muscle pain after starting a new medicine or changing the dose of a medicine. SEEK IMMEDIATE MEDICAL CARE IF:  You have trouble breathing.  You have trouble swallowing.  You have muscle pain along with a stiff neck, fever, and vomiting.  You have severe muscle weakness or cannot move part of your body. MAKE SURE YOU:   Understand these instructions.  Will watch your condition.  Will get help right away if you are not doing well or get worse. Document Released: 05/04/2006 Document Revised: 06/17/2013 Document Reviewed:  04/08/2013 Exit[employer] Patient Information 2015 [place], [place]. This information is not intended to replace advice given to you by your health care provider. Make sure you discuss any questions you have with your health care provider.

## 2014-02-18 NOTE — ED Provider Notes (Signed)
Medical screening examination/treatment/procedure(s) were performed by non-physician practitioner and as supervising physician I was immediately available for consultation/collaboration.   EKG Interpretation None        Ysela Hettinger, MD 02/18/14 [id number] 

## 2014-03-13 ENCOUNTER — Ambulatory Visit (INDEPENDENT_AMBULATORY_CARE_PROVIDER_SITE_OTHER): Payer: Federal, State, Local not specified - Other | Admitting: Psychiatry

## 2014-03-13 VITALS — BP 113/75 | HR 109 | Wt 178.0 lb

## 2014-03-13 DIAGNOSIS — F4323 Adjustment disorder with mixed anxiety and depressed mood: Secondary | ICD-10-CM

## 2014-03-13 DIAGNOSIS — F4322 Adjustment disorder with anxiety: Secondary | ICD-10-CM

## 2014-03-13 MED ORDER — CLONAZEPAM 0.5 MG PO TABS: 0.5000 mg | ORAL_TABLET | Freq: Three times a day (TID) | ORAL | Status: DC | PRN

## 2014-03-13 NOTE — Progress Notes (Signed)
[facility] MD Progress Note  03/13/2014 10:31 AM Kevin Hall  MRN:  [id number] Subjective: A bad year The patient's biggest issue is clearly physical. He's got problems with his Achilles tendon and with his knee. He also has some back problems. 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 The patient denies being depressed on a daily basis. He denies significant anxiety that is extensive. He is not dysfunctional because of depression or anxiety. His limiting factor is clearly as it seems to be due to the fact that his leg limits him physically and probably produces some moderate degree of pain. The patient does not drink any alcohol use any drugs. He takes Klonopin responsibly 0.5 mg 3 times a day. The patient if anything feels angry about what is happening with him. He describes himself going through roller coaster but cannot describe exactly what that means. Noted is the patient is been divorced since 2000. The patient sees a therapist here but only rare basis. General the patient is at his baseline. Will continue considering the possibility of one day reducing and discontinuing his Klonopin but at this time given the fact that the patient lives with an elderly mother and his 45-year-old son and that he is in chronic pain and limited I will be slow in stopping the medication that he describes as being very helpful for his day to day life. The specific anxiety disorder he has not clear. Technically would be describes an adjustment disorder with anxious mood state. Diagnosis:   DSM5: Schizophrenia Disorders:   Obsessive-Compulsive Disorders:   Trauma-Stressor Disorders:   Substance/Addictive Disorders:   Depressive Disorders:   Total Time spent with patient:   Axis I: Adjustment Disorder with Anxiety  ADL's:  Intact  Sleep: Good  Appetite:  Good  Suicidal Ideation:  no Homicidal Ideation:  none AEB (as evidenced by):  Psychiatric Specialty Exam: Physical Exam   ROS  Blood pressure 113/75, pulse 109, weight 178 lb (80.74 kg).Body mass index is 26.27 kg/(m^2).  General Appearance: Casual  Eye Contact::  Good  Speech:  Clear and Coherent  Volume:  Normal  Mood:  Euthymic  Affect:  Congruent  Thought Process:  Coherent  Orientation:  Full (Time, Place, and Person)  Thought Content:  WDL  Suicidal Thoughts:  No  Homicidal Thoughts:  No  Memory:  NA  Judgement:  Good  Insight:  Fair  Psychomotor Activity:  Normal  Concentration:  Good  Recall:  Good  Fund of Knowledge:Fair  Language: Good  Akathisia:  NA  Handed:  Right  AIMS (if indicated):     Assets:  Communication Skills  Sleep:      Musculoskeletal: Strength & Muscle Tone:  Gait & Station:  Patient leans:   Current Medications: Current Outpatient Prescriptions  Medication Sig Dispense Refill  . clonazePAM (KLONOPIN) 0.5 MG tablet Take 1 tablet (0.5 mg total) by mouth 3 (three) times daily as needed for anxiety.  60 tablet  4  . cyclobenzaprine (FLEXERIL) 10 MG tablet Take 1 tablet (10 mg total) by mouth 3 (three) times daily as needed for muscle spasms.  30 tablet  0  . ibuprofen (ADVIL,MOTRIN) 800 MG tablet Take 1 tablet (800 mg total) by mouth every 8 (eight) hours as needed. Takes  in the morning, and two tablets in the evening  30 tablet  0  . ibuprofen (ADVIL,MOTRIN) 800 MG tablet Take 1 tablet (800 mg total) by mouth 3 (three) times daily.  21 tablet  0  . nabumetone (RELAFEN) 500 MG tablet Take 500 mg by mouth 2 (two) times daily as needed for mild pain.       No current facility-administered medications for this visit.    Lab Results: No results found for this or any previous visit (from the past 48 hour(s)).  Physical Findings: AIMS:  , ,  ,  ,    CIWA:    COWS:     Treatment Plan Summary: At this time we will continue his Klonopin 0.5 mg 3 times a day. He she'll return to see me in 3-4 months and we will continue in psychotherapy here. The patient  continues to work with a disability lawyer around his physical issues and of course that lawyer may be contacting us for information. It is noted the patient has been psychiatric hospitalized probably about 4 times. The last one seemed to be in 2000. At this time he is no significant dysfunctional psychiatric symptoms but I do believe this patient probably has chronic situational anxiety.  Plan:  Medical Decision Making Problem Points:  Established problem, worsening (2) Data Points:  Review of medication regiment & side effects (2)  I certify that inpatient services furnished can reasonably be expected to improve the patient's condition.   Jonai Weyland IRVING 03/13/2014, 10:31 AM

## 2014-03-31 ENCOUNTER — Telehealth: Payer: Self-pay | Admitting: Family Medicine

## 2014-03-31 DIAGNOSIS — M7660 Achilles tendinitis, unspecified leg: Secondary | ICD-10-CM

## 2014-03-31 NOTE — Telephone Encounter (Signed)
Has been advised by psychiatrist to see a podiatrist about his Acheilias tendon Needs a referral to go to [facility]

## 2014-04-01 NOTE — Telephone Encounter (Signed)
Referral placed.

## 2014-04-17 ENCOUNTER — Ambulatory Visit (INDEPENDENT_AMBULATORY_CARE_PROVIDER_SITE_OTHER): Payer: Medicaid Other | Admitting: Podiatry

## 2014-04-17 ENCOUNTER — Ambulatory Visit (INDEPENDENT_AMBULATORY_CARE_PROVIDER_SITE_OTHER): Payer: Medicaid Other

## 2014-04-17 VITALS — BP 142/98 | HR 82 | Resp 16 | Ht 69.0 in | Wt 175.0 lb

## 2014-04-17 DIAGNOSIS — M722 Plantar fascial fibromatosis: Secondary | ICD-10-CM

## 2014-04-17 DIAGNOSIS — M7661 Achilles tendinitis, right leg: Secondary | ICD-10-CM

## 2014-04-17 DIAGNOSIS — M779 Enthesopathy, unspecified: Secondary | ICD-10-CM

## 2014-04-17 MED ORDER — IBUPROFEN 800 MG PO TABS: 800.0000 mg | ORAL_TABLET | Freq: Three times a day (TID) | ORAL | Status: DC | PRN

## 2014-04-17 NOTE — Progress Notes (Signed)
   Subjective:    Patient ID: Kevin Hall, male    DOB: July 02, 1970, 43 y.o.   MRN: [id number]  HPI Comments: Patient presents the office with complaints of right Achilles pain. Patient states that he previously has been treated by orthopedics who was told she had "mini tears" in his Achilles tendon. He was given what sounds like a cam boot to wear. After wearing the boot for 2 months came on the vent and is gradually doing some stretching exercises. He states he periodically wears the boot when symptoms increased. He has also been taking ibuprofen 800 mg. He states that the symptoms started April. States that he has not had an MRI. No other complaints at this time. Denies a specific injury or trauma to the area.     Review of Systems  All other systems reviewed and are negative.      Objective:   Physical Exam AAO x3, NAD DP/PT pulses palpable bilaterally, CRT less than 3 seconds Protective sensation intact with Simms Weinstein monofilament, vibratory sensation intact, Achilles tendon reflex intact  Mild tenderness to palpation over the right Achilles tendon in the mid substance. There is evidence of hypertrophy of the mid substance of the tendon. Thompson test was performed in the Achilles appears to be intact. No overlying edema, erythema. There is no pain on the insertion into the calcaneus. MMT 5/5, ROM WNL.  No calf pain with compression, swelling, warmth, erythema An open lesions.    Assessment & Plan:  43 year old male with right Achilles tendinitis, with healing of what appears to be previous partial tear -X-rays were obtained and reviewed with the patient. -Conservative versus surgical treatment discussed including alternatives, risks, complications. -At this time as he is been immobilized for a period of time would recommend starting a gradual rehabilitation process. Discussed with him physical therapy however he states that he is unable to go due to social constraints. I  discussed with him various stretching exercises. Discussed with him to gradually start these and if there is any increased pain or swelling to stop. Also discussed icing. -Follow-up in one month or sooner if any palms are to arise or any change in symptoms. If symptoms persist or worsen we'll obtain MRI. Call with any questions, concerns.

## 2014-04-17 NOTE — Patient Instructions (Signed)
Achilles Tendinitis   with Rehab  Achilles tendinitis is a disorder of the Achilles tendon. The Achilles tendon connects the large calf muscles (Gastrocnemius and Soleus) to the heel bone (calcaneus). This tendon is sometimes called the heel cord. It is important for pushing-off and standing on your toes and is important for walking, running, or jumping. Tendinitis is often caused by overuse and repetitive microtrauma.  SYMPTOMS  · Pain, tenderness, swelling, warmth, and redness may occur over the Achilles tendon even at rest.  · Pain with pushing off, or flexing or extending the ankle.  · Pain that is worsened after or during activity.  CAUSES   · Overuse sometimes seen with rapid increase in exercise programs or in sports requiring running and jumping.  · Poor physical conditioning (strength and flexibility or endurance).  · Running sports, especially training running down hills.  · Inadequate warm-up before practice or play or failure to stretch before participation.  · Injury to the tendon.  PREVENTION   · Warm up and stretch before practice or competition.  · Allow time for adequate rest and recovery between practices and competition.  · Keep up conditioning.  ¨ Keep up ankle and leg flexibility.  ¨ Improve or keep muscle strength and endurance.  ¨ Improve cardiovascular fitness.  · Use proper technique.  · Use proper equipment (shoes, skates).  · To help prevent recurrence, taping, protective strapping, or an adhesive bandage may be recommended for several weeks after healing is complete.  PROGNOSIS   · Recovery may take weeks to several months to heal.  · Longer recovery is expected if symptoms have been prolonged.  · Recovery is usually quicker if the inflammation is due to a direct blow as compared with overuse or sudden strain.  RELATED COMPLICATIONS   · Healing time will be prolonged if the condition is not correctly treated. The injury must be given plenty of time to heal.  · Symptoms can reoccur if  activity is resumed too soon.  · Untreated, tendinitis may increase the risk of tendon rupture requiring additional time for recovery and possibly surgery.  TREATMENT   · The first treatment consists of rest anti-inflammatory medication, and ice to relieve the pain.  · Stretching and strengthening exercises after resolution of pain will likely help reduce the risk of recurrence. Referral to a physical therapist or athletic trainer for further evaluation and treatment may be helpful.  · A walking boot or cast may be recommended to rest the Achilles tendon. This can help break the cycle of inflammation and microtrauma.  · Arch supports (orthotics) may be prescribed or recommended by your caregiver as an adjunct to therapy and rest.  · Surgery to remove the inflamed tendon lining or degenerated tendon tissue is rarely necessary and has shown less than predictable results.  MEDICATION   · Nonsteroidal anti-inflammatory medications, such as aspirin and ibuprofen, may be used for pain and inflammation relief. Do not take within 7 days before surgery. Take these as directed by your caregiver. Contact your caregiver immediately if any bleeding, stomach upset, or signs of allergic reaction occur. Other minor pain relievers, such as acetaminophen, may also be used.  · Pain relievers may be prescribed as necessary by your caregiver. Do not take prescription pain medication for longer than 4 to 7 days. Use only as directed and only as much as you need.  · Cortisone injections are rarely indicated. Cortisone injections may weaken tendons and predispose to rupture. It is better   to give the condition more time to heal than to use them.  HEAT AND COLD  · Cold is used to relieve pain and reduce inflammation for acute and chronic Achilles tendinitis. Cold should be applied for 10 to 15 minutes every 2 to 3 hours for inflammation and pain and immediately after any activity that aggravates your symptoms. Use ice packs or an ice  massage.  · Heat may be used before performing stretching and strengthening activities prescribed by your caregiver. Use a heat pack or a warm soak.  SEEK MEDICAL CARE IF:  · Symptoms get worse or do not improve in 2 weeks despite treatment.  · New, unexplained symptoms develop. Drugs used in treatment may produce side effects.  EXERCISES  RANGE OF MOTION (ROM) AND STRETCHING EXERCISES - Achilles Tendinitis   These exercises may help you when beginning to rehabilitate your injury. Your symptoms may resolve with or without further involvement from your physician, physical therapist or athletic trainer. While completing these exercises, remember:   · Restoring tissue flexibility helps normal motion to return to the joints. This allows healthier, less painful movement and activity.  · An effective stretch should be held for at least 30 seconds.  · A stretch should never be painful. You should only feel a gentle lengthening or release in the stretched tissue.  STRETCH - Gastroc, Standing   · Place hands on wall.  · Extend right / left leg, keeping the front knee somewhat bent.  · Slightly point your toes inward on your back foot.  · Keeping your right / left heel on the floor and your knee straight, shift your weight toward the wall, not allowing your back to arch.  · You should feel a gentle stretch in the right / left calf. Hold this position for __________ seconds.  Repeat __________ times. Complete this stretch __________ times per day.  STRETCH - Soleus, Standing   · Place hands on wall.  · Extend right / left leg, keeping the other knee somewhat bent.  · Slightly point your toes inward on your back foot.  · Keep your right / left heel on the floor, bend your back knee, and slightly shift your weight over the back leg so that you feel a gentle stretch deep in your back calf.  · Hold this position for __________ seconds.  Repeat __________ times. Complete this stretch __________ times per day.  STRETCH -  Gastrocsoleus, Standing   Note: This exercise can place a lot of stress on your foot and ankle. Please complete this exercise only if specifically instructed by your caregiver.   · Place the ball of your right / left foot on a step, keeping your other foot firmly on the same step.  · Hold on to the wall or a rail for balance.  · Slowly lift your other foot, allowing your body weight to press your heel down over the edge of the step.  · You should feel a stretch in your right / left calf.  · Hold this position for __________ seconds.  · Repeat this exercise with a slight bend in your knee.  Repeat __________ times. Complete this stretch __________ times per day.   STRENGTHENING EXERCISES - Achilles Tendinitis  These exercises may help you when beginning to rehabilitate your injury. They may resolve your symptoms with or without further involvement from your physician, physical therapist or athletic trainer. While completing these exercises, remember:   · Muscles can gain both the endurance   and the strength needed for everyday activities through controlled exercises.  · Complete these exercises as instructed by your physician, physical therapist or athletic trainer. Progress the resistance and repetitions only as guided.  · You may experience muscle soreness or fatigue, but the pain or discomfort you are trying to eliminate should never worsen during these exercises. If this pain does worsen, stop and make certain you are following the directions exactly. If the pain is still present after adjustments, discontinue the exercise until you can discuss the trouble with your clinician.  STRENGTH - Plantar-flexors   · Sit with your right / left leg extended. Holding onto both ends of a rubber exercise band/tubing, loop it around the ball of your foot. Keep a slight tension in the band.  · Slowly push your toes away from you, pointing them downward.  · Hold this position for __________ seconds. Return slowly, controlling the  tension in the band/tubing.  Repeat __________ times. Complete this exercise __________ times per day.   STRENGTH - Plantar-flexors   · Stand with your feet shoulder width apart. Steady yourself with a wall or table using as little support as needed.  · Keeping your weight evenly spread over the width of your feet, rise up on your toes.*  · Hold this position for __________ seconds.  Repeat __________ times. Complete this exercise __________ times per day.   *If this is too easy, shift your weight toward your right / left leg until you feel challenged. Ultimately, you may be asked to do this exercise with your right / left foot only.  STRENGTH - Plantar-flexors, Eccentric   Note: This exercise can place a lot of stress on your foot and ankle. Please complete this exercise only if specifically instructed by your caregiver.   · Place the balls of your feet on a step. With your hands, use only enough support from a wall or rail to keep your balance.  · Keep your knees straight and rise up on your toes.  · Slowly shift your weight entirely to your right / left toes and pick up your opposite foot. Gently and with controlled movement, lower your weight through your right / left foot so that your heel drops below the level of the step. You will feel a slight stretch in the back of your calf at the end position.  · Use the healthy leg to help rise up onto the balls of both feet, then lower weight only on the right / left leg again. Build up to 15 repetitions. Then progress to 3 consecutive sets of 15 repetitions.*  · After completing the above exercise, complete the same exercise with a slight knee bend (about 30 degrees). Again, build up to 15 repetitions. Then progress to 3 consecutive sets of 15 repetitions.*  Perform this exercise __________ times per day.   *When you easily complete 3 sets of 15, your physician, physical therapist or athletic trainer may advise you to add resistance by wearing a backpack filled with  additional weight.  STRENGTH - Plantar Flexors, Seated   · Sit on a chair that allows your feet to rest flat on the ground. If necessary, sit at the edge of the chair.  · Keeping your toes firmly on the ground, lift your right / left heel as far as you can without increasing any discomfort in your ankle.  Repeat __________ times. Complete this exercise __________ times a day.  *If instructed by your physician, physical therapist or athletic   trainer, you may add ____________________ of resistance by placing a weighted object on your right / left knee.  Document Released: 01/11/2005 Document Revised: 09/04/2011 Document Reviewed: 09/24/2008  [employer]® Patient Information ©2015 [employer]. This information is not intended to replace advice given to you by your health care provider. Make sure you discuss any questions you have with your health care provider.

## 2014-04-20 ENCOUNTER — Encounter: Payer: Self-pay | Admitting: Podiatry

## 2014-05-15 ENCOUNTER — Encounter: Payer: Self-pay | Admitting: Podiatry

## 2014-05-15 ENCOUNTER — Ambulatory Visit (INDEPENDENT_AMBULATORY_CARE_PROVIDER_SITE_OTHER): Payer: Medicaid Other | Admitting: Podiatry

## 2014-05-15 ENCOUNTER — Telehealth: Payer: Self-pay | Admitting: *Deleted

## 2014-05-15 VITALS — BP 130/94 | HR 71 | Resp 15

## 2014-05-15 DIAGNOSIS — M7661 Achilles tendinitis, right leg: Secondary | ICD-10-CM

## 2014-05-15 MED ORDER — IBUPROFEN 800 MG PO TABS: 800.0000 mg | ORAL_TABLET | Freq: Three times a day (TID) | ORAL | Status: DC | PRN

## 2014-05-15 NOTE — Telephone Encounter (Signed)
Prescription for a Musculoskeletal cream was sent to [facility] Clonidine 0.2%, Diclofenac 3%, Gabapentin 6%, Tetracaine 2% and Verapamil 10% Quantity: 180 gm, Refills: 2, Diagnosis: Tendinitis, Allergy: NKDA

## 2014-05-15 NOTE — Patient Instructions (Signed)
Achilles Tendinitis   with Rehab  Achilles tendinitis is a disorder of the Achilles tendon. The Achilles tendon connects the large calf muscles (Gastrocnemius and Soleus) to the heel bone (calcaneus). This tendon is sometimes called the heel cord. It is important for pushing-off and standing on your toes and is important for walking, running, or jumping. Tendinitis is often caused by overuse and repetitive microtrauma.  SYMPTOMS  · Pain, tenderness, swelling, warmth, and redness may occur over the Achilles tendon even at rest.  · Pain with pushing off, or flexing or extending the ankle.  · Pain that is worsened after or during activity.  CAUSES   · Overuse sometimes seen with rapid increase in exercise programs or in sports requiring running and jumping.  · Poor physical conditioning (strength and flexibility or endurance).  · Running sports, especially training running down hills.  · Inadequate warm-up before practice or play or failure to stretch before participation.  · Injury to the tendon.  PREVENTION   · Warm up and stretch before practice or competition.  · Allow time for adequate rest and recovery between practices and competition.  · Keep up conditioning.  ¨ Keep up ankle and leg flexibility.  ¨ Improve or keep muscle strength and endurance.  ¨ Improve cardiovascular fitness.  · Use proper technique.  · Use proper equipment (shoes, skates).  · To help prevent recurrence, taping, protective strapping, or an adhesive bandage may be recommended for several weeks after healing is complete.  PROGNOSIS   · Recovery may take weeks to several months to heal.  · Longer recovery is expected if symptoms have been prolonged.  · Recovery is usually quicker if the inflammation is due to a direct blow as compared with overuse or sudden strain.  RELATED COMPLICATIONS   · Healing time will be prolonged if the condition is not correctly treated. The injury must be given plenty of time to heal.  · Symptoms can reoccur if  activity is resumed too soon.  · Untreated, tendinitis may increase the risk of tendon rupture requiring additional time for recovery and possibly surgery.  TREATMENT   · The first treatment consists of rest anti-inflammatory medication, and ice to relieve the pain.  · Stretching and strengthening exercises after resolution of pain will likely help reduce the risk of recurrence. Referral to a physical therapist or athletic trainer for further evaluation and treatment may be helpful.  · A walking boot or cast may be recommended to rest the Achilles tendon. This can help break the cycle of inflammation and microtrauma.  · Arch supports (orthotics) may be prescribed or recommended by your caregiver as an adjunct to therapy and rest.  · Surgery to remove the inflamed tendon lining or degenerated tendon tissue is rarely necessary and has shown less than predictable results.  MEDICATION   · Nonsteroidal anti-inflammatory medications, such as aspirin and ibuprofen, may be used for pain and inflammation relief. Do not take within 7 days before surgery. Take these as directed by your caregiver. Contact your caregiver immediately if any bleeding, stomach upset, or signs of allergic reaction occur. Other minor pain relievers, such as acetaminophen, may also be used.  · Pain relievers may be prescribed as necessary by your caregiver. Do not take prescription pain medication for longer than 4 to 7 days. Use only as directed and only as much as you need.  · Cortisone injections are rarely indicated. Cortisone injections may weaken tendons and predispose to rupture. It is better   to give the condition more time to heal than to use them.  HEAT AND COLD  · Cold is used to relieve pain and reduce inflammation for acute and chronic Achilles tendinitis. Cold should be applied for 10 to 15 minutes every 2 to 3 hours for inflammation and pain and immediately after any activity that aggravates your symptoms. Use ice packs or an ice  massage.  · Heat may be used before performing stretching and strengthening activities prescribed by your caregiver. Use a heat pack or a warm soak.  SEEK MEDICAL CARE IF:  · Symptoms get worse or do not improve in 2 weeks despite treatment.  · New, unexplained symptoms develop. Drugs used in treatment may produce side effects.  EXERCISES  RANGE OF MOTION (ROM) AND STRETCHING EXERCISES - Achilles Tendinitis   These exercises may help you when beginning to rehabilitate your injury. Your symptoms may resolve with or without further involvement from your physician, physical therapist or athletic trainer. While completing these exercises, remember:   · Restoring tissue flexibility helps normal motion to return to the joints. This allows healthier, less painful movement and activity.  · An effective stretch should be held for at least 30 seconds.  · A stretch should never be painful. You should only feel a gentle lengthening or release in the stretched tissue.  STRETCH - Gastroc, Standing   · Place hands on wall.  · Extend right / left leg, keeping the front knee somewhat bent.  · Slightly point your toes inward on your back foot.  · Keeping your right / left heel on the floor and your knee straight, shift your weight toward the wall, not allowing your back to arch.  · You should feel a gentle stretch in the right / left calf. Hold this position for __________ seconds.  Repeat __________ times. Complete this stretch __________ times per day.  STRETCH - Soleus, Standing   · Place hands on wall.  · Extend right / left leg, keeping the other knee somewhat bent.  · Slightly point your toes inward on your back foot.  · Keep your right / left heel on the floor, bend your back knee, and slightly shift your weight over the back leg so that you feel a gentle stretch deep in your back calf.  · Hold this position for __________ seconds.  Repeat __________ times. Complete this stretch __________ times per day.  STRETCH -  Gastrocsoleus, Standing   Note: This exercise can place a lot of stress on your foot and ankle. Please complete this exercise only if specifically instructed by your caregiver.   · Place the ball of your right / left foot on a step, keeping your other foot firmly on the same step.  · Hold on to the wall or a rail for balance.  · Slowly lift your other foot, allowing your body weight to press your heel down over the edge of the step.  · You should feel a stretch in your right / left calf.  · Hold this position for __________ seconds.  · Repeat this exercise with a slight bend in your knee.  Repeat __________ times. Complete this stretch __________ times per day.   STRENGTHENING EXERCISES - Achilles Tendinitis  These exercises may help you when beginning to rehabilitate your injury. They may resolve your symptoms with or without further involvement from your physician, physical therapist or athletic trainer. While completing these exercises, remember:   · Muscles can gain both the endurance   and the strength needed for everyday activities through controlled exercises.  · Complete these exercises as instructed by your physician, physical therapist or athletic trainer. Progress the resistance and repetitions only as guided.  · You may experience muscle soreness or fatigue, but the pain or discomfort you are trying to eliminate should never worsen during these exercises. If this pain does worsen, stop and make certain you are following the directions exactly. If the pain is still present after adjustments, discontinue the exercise until you can discuss the trouble with your clinician.  STRENGTH - Plantar-flexors   · Sit with your right / left leg extended. Holding onto both ends of a rubber exercise band/tubing, loop it around the ball of your foot. Keep a slight tension in the band.  · Slowly push your toes away from you, pointing them downward.  · Hold this position for __________ seconds. Return slowly, controlling the  tension in the band/tubing.  Repeat __________ times. Complete this exercise __________ times per day.   STRENGTH - Plantar-flexors   · Stand with your feet shoulder width apart. Steady yourself with a wall or table using as little support as needed.  · Keeping your weight evenly spread over the width of your feet, rise up on your toes.*  · Hold this position for __________ seconds.  Repeat __________ times. Complete this exercise __________ times per day.   *If this is too easy, shift your weight toward your right / left leg until you feel challenged. Ultimately, you may be asked to do this exercise with your right / left foot only.  STRENGTH - Plantar-flexors, Eccentric   Note: This exercise can place a lot of stress on your foot and ankle. Please complete this exercise only if specifically instructed by your caregiver.   · Place the balls of your feet on a step. With your hands, use only enough support from a wall or rail to keep your balance.  · Keep your knees straight and rise up on your toes.  · Slowly shift your weight entirely to your right / left toes and pick up your opposite foot. Gently and with controlled movement, lower your weight through your right / left foot so that your heel drops below the level of the step. You will feel a slight stretch in the back of your calf at the end position.  · Use the healthy leg to help rise up onto the balls of both feet, then lower weight only on the right / left leg again. Build up to 15 repetitions. Then progress to 3 consecutive sets of 15 repetitions.*  · After completing the above exercise, complete the same exercise with a slight knee bend (about 30 degrees). Again, build up to 15 repetitions. Then progress to 3 consecutive sets of 15 repetitions.*  Perform this exercise __________ times per day.   *When you easily complete 3 sets of 15, your physician, physical therapist or athletic trainer may advise you to add resistance by wearing a backpack filled with  additional weight.  STRENGTH - Plantar Flexors, Seated   · Sit on a chair that allows your feet to rest flat on the ground. If necessary, sit at the edge of the chair.  · Keeping your toes firmly on the ground, lift your right / left heel as far as you can without increasing any discomfort in your ankle.  Repeat __________ times. Complete this exercise __________ times a day.  *If instructed by your physician, physical therapist or athletic   trainer, you may add ____________________ of resistance by placing a weighted object on your right / left knee.  Document Released: 01/11/2005 Document Revised: 09/04/2011 Document Reviewed: 09/24/2008  [employer]® Patient Information ©2015 [employer]. This information is not intended to replace advice given to you by your health care provider. Make sure you discuss any questions you have with your health care provider.

## 2014-05-19 ENCOUNTER — Ambulatory Visit (INDEPENDENT_AMBULATORY_CARE_PROVIDER_SITE_OTHER): Payer: Federal, State, Local not specified - Other | Admitting: Psychiatry

## 2014-05-19 ENCOUNTER — Encounter: Payer: Self-pay | Admitting: Podiatry

## 2014-05-19 ENCOUNTER — Ambulatory Visit (HOSPITAL_COMMUNITY): Payer: Self-pay | Admitting: Psychiatry

## 2014-05-19 ENCOUNTER — Telehealth: Payer: Self-pay | Admitting: *Deleted

## 2014-05-19 DIAGNOSIS — F4323 Adjustment disorder with mixed anxiety and depressed mood: Secondary | ICD-10-CM

## 2014-05-19 NOTE — Progress Notes (Signed)
Patient ID: Kevin Hall, male   DOB: 08-27-1970, 43 y.o.   MRN: [id number]  Subjective: 43 year old male returns the office for evaluation of right Achilles tendon pain. He states that he has been continuing with the stretching exercises as well as icing and taking ibuprofen as needed for the pain. He states that he has pain intermittently. It varies depending on the day. No acute changes since last appointment. Denies any systemic complaints as fevers, chills, nausea, vomiting.  Objective: AAO x3, NAD DP/PT pulses palpable bilaterally, CRT less than 3 seconds Protective sensation intact with Simms Weinstein monofilament, vibratory sensation intact, Achilles tendon reflex intact Mild tenderness to palpation over the posterior aspect of the right leg over the distal aspect of the Achilles tendon within the mid substance. There is hypertrophic tissue within the substance of the Achilles tendon. Thompson to assess was performed and the Achilles tendon appears to be intact. There is no overlying edema, erythema, increase in warmth. There is no tenderness to palpation of the Achilles tendon within the insertion of the calcaneus. MMT 5/5, ROM WNL No open lesions or pre-ulcerative lesions. No pain with calf compression, swelling, warmth, erythema.  Assessment: 43 year old male with Achilles tendinitis right foot  Plan: -Conservative versus surgical treatment were discussed including alternatives, risks, complications. -The patient continues to be concerned about a tear to the tendon. I recommended MRI to evaluate for this however the patient does not want to proceed with an MRI at this time. I discussed the patient that he is risk for tendon rupture and he states that he is "just waiting for it to rupture". I discussed with him ways to help prevent this from happening. I discussed gradual stretching exercises and I recommended physical therapy however he does not want to proceed with this. We discussed  home exercises and discussed that if he starts this is started gradually and if he has any increase in symptoms to decrease the activity or discontinued them. Discussed ice to the area. Prescribed compound cream to help with some of the symptoms. Continue heel lifts as needed. Renewed ibuprofen however discussed with him that if he has any side effects and medications, which were discussed with him, to discontinue it and call the office. -Patient has a CAM walker at home that he can wear if needed. -Follow-up in 6 weeks. In the meantime, call the office with any questions, concerns, change in symptoms.

## 2014-05-19 NOTE — Progress Notes (Signed)
   THERAPIST PROGRESS NOTE   Session Time: 1:00-2:00pm   Participation Level: Active   Behavioral Response: Well GroomedAlertAnxious   Type of Therapy: Individual Therapy   Treatment Goals addressed: Anxiety and Coping   Interventions: CBT, Solution Focused, Strength-based, Supportive and Reframing   Summary: Pt. Continues to present with anxious and mildly agitated affect. Pt. Continues to report that his four year old son is primary source of emotional support. Pt. Is resistant to seeking support from significant friendships or support groups. Pt. Continues to report significant pain due to his achilles tendon. Pt. Continues to process frustration and feelings of betrayal due to end of relationship with his son's mother. Pt. Finds comfort in ability to play his brother's bass guitar and desire to play in a music session with peers. Pt. Discussed planning Thanksgiving with his mother, brother, and his son.   Suicidal/Homicidal: Nowithout intent/plan   Therapist Response: Assessed patients current functioning and reviewed progress. Reviewed coping strategies. Assessed patients safety and assisted in identifying protective factors. Assisted patient with the expression of anxiety and frustration. Assessed progress related to self care.Used CBT to assist patient with the identification of negative distortions and irrational thoughts.   Plan: Pt. To continue with CBT based therapy. Return again in 4-6 weeks.   Diagnosis: Axis I: Adjustment Disorder with Mixed Emotional Features   Axis II: No diagnosis   [name], Balraj Brayfield B, [facility] 05/19/2014

## 2014-05-19 NOTE — Telephone Encounter (Signed)
Prescription was changed to the Musculoskeletal Medicare Formula with [facility]. Amantadine 3%, Carbamazepine 3%, Gabapentin 6%, Meloxicam 0.5% and Pentoxifyline 3% Quantity: 180 gm, Refill: 2, Diagnosis: Tendinitis, Allergy: NKDA

## 2014-05-19 NOTE — Telephone Encounter (Signed)
-----   Message from Marylou MccoyJessica L Quintana, RN sent at 05/19/2014 11:13 AM EST ----- Can you see if there is a medicare option for the prescription dr wagoner prescribed for this patient on friday ----- Message -----    From: Ovid CurdMatthew Wagoner, DPM    Sent: 05/19/2014   8:40 AM      To: Marylou MccoyJessica L Quintana, RN  There is a medicare option that may be covered. Can you check on that? Thanks.   ----- Message -----    From: Marylou MccoyJessica L Quintana, RN    Sent: 05/19/2014   8:14 AM      To: Ovid CurdMatthew Wagoner, DPM  Pt called and said that the cream or medication that you prescribed him last Friday isnt covered, do you want to write him something else?

## 2014-05-20 ENCOUNTER — Telehealth: Payer: Self-pay | Admitting: *Deleted

## 2014-05-20 NOTE — Telephone Encounter (Addendum)
Pt states [facility] keeps calling and please ask them to stop.  Left msg [phone] [facility] informing that pt did not want the medication , because insurance would not cover, and not to call the pt at home or work.

## 2014-06-12 ENCOUNTER — Ambulatory Visit (HOSPITAL_COMMUNITY): Payer: Self-pay | Admitting: Psychiatry

## 2014-06-22 ENCOUNTER — Ambulatory Visit (INDEPENDENT_AMBULATORY_CARE_PROVIDER_SITE_OTHER): Payer: Federal, State, Local not specified - Other | Admitting: Psychiatry

## 2014-06-22 DIAGNOSIS — F4323 Adjustment disorder with mixed anxiety and depressed mood: Secondary | ICD-10-CM

## 2014-06-23 NOTE — Progress Notes (Signed)
   THERAPIST PROGRESS NOTE  Session Time: 1:00-2:00pm   Participation Level: Active   Behavioral Response: Well GroomedAlertAnxious   Type of Therapy: Individual Therapy   Treatment Goals addressed: Anxiety and Coping   Interventions: CBT, Solution Focused, Strength-based, Supportive and Reframing   Summary: Pt. Continues to present with anxious and mildly agitated affect. Pt. Continues to report anger, frustration and fear regarding his son's mother and unfounded concerns that she may take him. Pt. Is overprotective of his son and discusses excessive concern regarding discipline, potty training, and daycare. Pt. Reports that he had a good Christmas with his mother, brother, and son. Pt. Continues to report significant pain in his ankle tendon and frustration regarding treatments that have not been approved and potential for surgery. Pt. Reports that despite his pain he has been very active, walking frequently with his son and taking the bus. Pt. Reports that he plays music with his guitar frequently, enjoys exposing his son to music, and considers music significant strength that contributes to his mental health.   Suicidal/Homicidal: Nowithout intent/plan   Therapist Response: Assessed patients current functioning and reviewed progress. Reviewed coping strategies. Assessed patients safety and assisted in identifying protective factors. Assisted patient with the expression of anxiety and frustration. Assessed progress related to self care.Used CBT to assist patient with the identification of negative distortions and irrational thoughts.   Plan: Pt. To continue with CBT based therapy. Return again in 4-6 weeks.   Diagnosis: Axis I: Adjustment Disorder with Mixed Emotional Features   Axis II: No diagnosis   [name], Jennifer B, [facility] 06/23/2014

## 2014-07-03 ENCOUNTER — Telehealth: Payer: Self-pay | Admitting: *Deleted

## 2014-07-03 ENCOUNTER — Ambulatory Visit (INDEPENDENT_AMBULATORY_CARE_PROVIDER_SITE_OTHER): Payer: Federal, State, Local not specified - Other | Admitting: Psychiatry

## 2014-07-03 ENCOUNTER — Encounter: Payer: Self-pay | Admitting: Podiatry

## 2014-07-03 ENCOUNTER — Ambulatory Visit (INDEPENDENT_AMBULATORY_CARE_PROVIDER_SITE_OTHER): Payer: Medicaid Other | Admitting: Podiatry

## 2014-07-03 VITALS — BP 129/83 | HR 74 | Resp 14

## 2014-07-03 VITALS — BP 119/79 | HR 82 | Wt 179.2 lb

## 2014-07-03 DIAGNOSIS — M7661 Achilles tendinitis, right leg: Secondary | ICD-10-CM

## 2014-07-03 DIAGNOSIS — F4322 Adjustment disorder with anxiety: Secondary | ICD-10-CM

## 2014-07-03 DIAGNOSIS — F4323 Adjustment disorder with mixed anxiety and depressed mood: Secondary | ICD-10-CM

## 2014-07-03 MED ORDER — ACETAMINOPHEN-CODEINE #3 300-30 MG PO TABS: 1.0000 | ORAL_TABLET | Freq: Four times a day (QID) | ORAL | Status: DC | PRN

## 2014-07-03 MED ORDER — DICLOFENAC SODIUM 1 % TD GEL: 2.0000 g | Freq: Two times a day (BID) | TRANSDERMAL | Status: DC

## 2014-07-03 MED ORDER — CLONAZEPAM 0.5 MG PO TABS: 0.5000 mg | ORAL_TABLET | Freq: Three times a day (TID) | ORAL | Status: DC | PRN

## 2014-07-03 NOTE — Telephone Encounter (Signed)
"  We have a prescription that we received today.  We need to verify the order.  First part of it states apply 2 times a day.  After that it says rub it in 2-4 times a day.  Which is it?"  I told her that Dr. Ardelle AntonWagoner said to just do 2 times a day.  "Okay, we'll take care of that."

## 2014-07-03 NOTE — Progress Notes (Signed)
[facility] MD Progress Note  07/03/2014 11:18 AM Kevin Hall  MRN:  [id number] Subjective: No change The patient's mood is fairly good. He says the Klonopin at the present dose is very helpful. His stressors remain his right pain in his ankle from his Achilles tendon. He does have an appointment in the next 2 weeks to see a podiatrist. The patient is sleeping and eating well. He's got good energy. He does a great job taking care of his 45-year-old son. He lives with his mother. The patient enjoys playing guitar being on the computer watching TV. The patient is very stable. He clearly is limited by physical pain in his right foot. He denies being depressed. He denies the use of alcohol or drugs. For now he is very stable. Diagnosis:   DSM5: Schizophrenia Disorders:   Obsessive-Compulsive Disorders:   Trauma-Stressor Disorders:   Substance/Addictive Disorders:   Depressive Disorders:   Total Time spent with patient:   Axis I: Adjustment Disorder with Anxiety  ADL's:  Intact  Sleep: Good  Appetite:  Good  Suicidal Ideation:  nononeHomicidal Ideation:   AEB (as evidenced by):  Psychiatric Specialty Exam: Physical Exam  ROS  Blood pressure 119/79, pulse 82, weight 179 lb 3.2 oz (81.285 kg).Body mass index is 26.45 kg/(m^2).  General Appearance: Casual  Eye Contact::  Good  Speech:  Clear and Coherent  Volume:  Normal  Mood  nl  Affect:  Congruent  Thought Process:  Coherent  Orientation:  Full (Time, Place, and Person)  Thought Content:  WDL  Suicidal Thoughts:  No  Homicidal Thoughts:  No  Memory:  NA  Judgement:  Good  Insight:  Fair  Psychomotor Activity:  Normal  Concentration:  Good  Recall:  Good  Fund of Knowledge:Good  Language: Good  Akathisia:  No  Handed:  Right  AIMS (if indicated):     Assets:  Communication Skills  Sleep:      Musculoskeletal: Strength & Muscle Tone:  Gait & Station:  Patient leans:   Current Medications: Current Outpatient  Prescriptions  Medication Sig Dispense Refill  . clonazePAM (KLONOPIN) 0.5 MG tablet Take 1 tablet (0.5 mg total) by mouth 3 (three) times daily as needed for anxiety. 90 tablet 4  . cyclobenzaprine (FLEXERIL) 10 MG tablet Take 1 tablet (10 mg total) by mouth 3 (three) times daily as needed for muscle spasms. 30 tablet 0  . ibuprofen (ADVIL,MOTRIN) 800 MG tablet Take 1 tablet (800 mg total) by mouth 3 (three) times daily. 21 tablet 0  . ibuprofen (ADVIL,MOTRIN) 800 MG tablet Take 1 tablet (800 mg total) by mouth every 8 (eight) hours as needed. Takes  in the morning, and two tablets in the evening 30 tablet 0  . nabumetone (RELAFEN) 500 MG tablet Take 500 mg by mouth 2 (two) times daily as needed for mild pain.     No current facility-administered medications for this visit.    Lab Results: No results found for this or any previous visit (from the past 48 hour(s)).  Physical Findings: AIMS:  , ,  ,  ,    CIWA:    COWS:     Treatment Plan Summary: At this time the patient will continue in therapy and continue taking Klonopin 0.5 mg 3 times a day. I will likely continue Klonopin indefinitely. This patient functions very well on this agent and shows no evidence of misuse of it this agent. He is a number of psychosocial stressors including chronic pain. This patient  she'll return to see me in 3 months at that time he will have a fuller reevaluation.  Plan:  Medical Decision Making Problem Points:  Established problem, stable/improving (1) Data Points:  Review and summation of old records (2) Review of medication regiment & side effects (2)  I certify that inpatient services furnished can reasonably be expected to improve the patient's condition.   Kevin Hall 07/03/2014, 11:18 AM

## 2014-07-06 ENCOUNTER — Encounter: Payer: Self-pay | Admitting: Podiatry

## 2014-07-06 NOTE — Progress Notes (Signed)
Patient ID: Kevin Hall, Kevin Hall   DOB: 1970/08/10, 44 y.o.   MRN: [id number]  Subjective: 44 year old Kevin Hall returns the office for follow-up evaluation of right Achilles tendon pain. He states that he has good days and bad days. He was unable to get the compound cream which was prescribed him due to insurance reasons. He's been continuing the heel lifts. He intermittently states that he wears the cam boot if needed. No acute changes since last appointment and no other complaints at this time. Denies any recent injury or trauma to the area.  Objective: AAO x3, NAD DP/PT pulses palpable bilaterally, CRT less than 3 seconds Protective sensation intact with Simms Weinstein monofilament, vibratory sensation intact, Achilles tendon reflex intact Mild tenderness continues over the posterior aspect of the right leg on the distal aspect of the Achilles tendon within the mid substance. There is a hyper trophic area of tissue within the midsubstance the Achilles tendon. Thompson test was performed of the tendon appears to be intact. There is no overlying edema, erythema, increase in warmth. No areas of pinpoint bony tenderness or pain with vibratory sensation. MMT 5/5, ROM WNL Pain with calf compression, swelling, warmth, erythema. No open lesions or pre-ulcer lesions identified.  Assessment: 44 year old Kevin Hall with right Achilles tendinitis.  Plan: -Treatment options were discussed with the patient which include alternatives, risks, complications. -At this time discussed possible MRI and physical therapy however he declines. Prescribed full tear and gel. Continue with gradual stretching/strengthening exercises at home. -Patient with a cam walker as necessary. -Continue heel lift as needed. -Follow-up as needed. Encouraged the patient to make follow-up appointment if the symptoms continue over the next 4-6 weeks. Otherwise encouraged call the office with any questions, concerns, change in symptoms.

## 2014-07-13 ENCOUNTER — Ambulatory Visit (HOSPITAL_COMMUNITY): Payer: Self-pay | Admitting: Psychiatry

## 2014-07-27 ENCOUNTER — Ambulatory Visit (INDEPENDENT_AMBULATORY_CARE_PROVIDER_SITE_OTHER): Payer: Federal, State, Local not specified - Other | Admitting: Psychiatry

## 2014-07-27 DIAGNOSIS — F4323 Adjustment disorder with mixed anxiety and depressed mood: Secondary | ICD-10-CM

## 2014-07-27 NOTE — Progress Notes (Signed)
   THERAPIST PROGRESS NOTE Session Time: 1:05-2:00pm   Participation Level: Active   Behavioral Response: Well GroomedAlertAnxious   Type of Therapy: Individual Therapy   Treatment Goals addressed: Anxiety and Coping   Interventions: CBT, Solution Focused, Strength-based, Supportive and Reframing   Summary: Pt. Present presented with mildly anxious affect. Pt. Did not complain of heel pain. Pt reported anxiety related to going to court for disability on 2/22. Pt. Discussed financial stress and inability to move forward with financial goals and desire to be less financially dependent on his mother. Pt. Discussed concerns about potty training his son and frustration with his son. Pt. Discussed concern about contact with his son's mother and continues to feel the need to not introduce mother to his son because of concerns about her fitness to parent. Pt. Discussed work history and damage to his body due to physical nature of his past jobs. Session focused on continuing self-care and stress management.   Suicidal/Homicidal: Nowithout intent/plan   Therapist Response: Assessed patients current functioning and reviewed progress. Reviewed coping strategies. Assessed patients safety and assisted in identifying protective factors. Assisted patient with the expression of anxiety and frustration. Assessed progress related to self care.Used CBT to assist patient with the identification of negative distortions and irrational thoughts.   Plan: Pt. To continue with CBT based therapy. Return again in 4-6 weeks.   Diagnosis: Axis I: Adjustment Disorder with Mixed Emotional Features   Axis II: No diagnosis   [name], [place]iah Trawick B, [facility] 07/27/2014

## 2014-08-10 ENCOUNTER — Ambulatory Visit (HOSPITAL_COMMUNITY): Payer: Self-pay | Admitting: Psychiatry

## 2014-09-15 ENCOUNTER — Telehealth: Payer: Self-pay | Admitting: *Deleted

## 2014-09-15 DIAGNOSIS — M7661 Achilles tendinitis, right leg: Secondary | ICD-10-CM

## 2014-09-15 NOTE — Telephone Encounter (Signed)
"  Last time I spoke to Dr. Ardelle AntonWagoner, he said if my foot, achilles tendon, didn't get any better he would schedule me for a MRI.  I was wondering if I could do that without me coming in to see him?  Give me a call back..  I called and informed him I will go ahead and put the order in for the MRI.  They will call you to schedule it.  [facility]'s [facility] on [address]  "Where at on [address]?"  It is located at the corner of [address]  "Okay, thank you."

## 2014-09-16 ENCOUNTER — Telehealth: Payer: Self-pay | Admitting: *Deleted

## 2014-09-16 NOTE — Telephone Encounter (Signed)
Kevin KotykEllen - [facility] request Medicaid prior authorization of MRI ankle.

## 2014-09-18 ENCOUNTER — Other Ambulatory Visit: Payer: Self-pay

## 2014-10-02 ENCOUNTER — Ambulatory Visit (INDEPENDENT_AMBULATORY_CARE_PROVIDER_SITE_OTHER): Payer: Medicaid Other | Admitting: Podiatry

## 2014-10-02 ENCOUNTER — Encounter: Payer: Self-pay | Admitting: Podiatry

## 2014-10-02 VITALS — BP 152/99 | HR 104 | Resp 16

## 2014-10-02 DIAGNOSIS — M7661 Achilles tendinitis, right leg: Secondary | ICD-10-CM

## 2014-10-05 ENCOUNTER — Encounter: Payer: Self-pay | Admitting: Podiatry

## 2014-10-05 NOTE — Progress Notes (Signed)
Patient ID: Kevin Hall, male   DOB: [birth year], 44 y.o.   MRN: [id number]  Subjective: Kevin Hall returns the office for follow-up evaluation of right Achilles tendon pain. She presents today in a [employer] Walker as he states he is lateral walking which aggravates the symptoms. He also states he is unable to do the MRI due to insurance reasons. He continues to have pain about the same aspect of the Achilles tendon overlying the thickened portion. Denies any swelling or redness. No other complaints at this time. Denies any recent injury or trauma to the area.  Objective: AAO x3, NAD DP/PT pulses palpable bilaterally, CRT less than 3 seconds Protective sensation intact with Simms Weinstein monofilament, vibratory sensation intact, Achilles tendon reflex intact Mild tenderness continues over the posterior aspect of the right leg on the distal aspect of the Achilles tendon within the mid substance. There is a hypertrophic area of tissue within the midsubstance the Achilles tendon. Thompson test was performed of the tendon appears to be intact. No defects noted. There is no overlying edema, erythema, increase in warmth. No areas of pinpoint bony tenderness or pain with vibratory sensation. MMT 5/5, ROM WNL Pain with calf compression, swelling, warmth, erythema. No open lesions or pre-ulcer lesions identified.  Assessment: 44 year old male with right Achilles tendinitis.  Plan: -Treatment options were discussed with the patient which include alternatives, risks, complications. -Due to continued pain I recommended physical therapy. Patient was inquiring about surgical intervention however given his social situation may not be the best idea this time. Will reevaluate this after physical therapy. -Continue with a CAM walker as necessary. -Continue heel lift as needed. -Follow-up after PT evaluation. In the meantime, encouraged to call the office with any questions/concerns.

## 2014-10-08 ENCOUNTER — Ambulatory Visit (INDEPENDENT_AMBULATORY_CARE_PROVIDER_SITE_OTHER): Payer: Federal, State, Local not specified - Other | Admitting: Psychiatry

## 2014-10-08 DIAGNOSIS — F4323 Adjustment disorder with mixed anxiety and depressed mood: Secondary | ICD-10-CM | POA: Diagnosis not present

## 2014-10-09 NOTE — Progress Notes (Signed)
   THERAPIST PROGRESS NOTE  Session Time: 2:00-3:00   Participation Level: Active   Behavioral Response: Well GroomedAlertAnxious   Type of Therapy: Individual Therapy   Treatment Goals addressed: Anxiety and Coping   Interventions: CBT, Solution Focused, Strength-based, Supportive and Reframing   Summary: Pt. Continues to present with anxious affect, talkative, poor eye contact. Pt. Arrived at 1:15 for appointment, believed his appointment was scheduled for 1:30. Pt. Reported frustration related to third denial of disability claim. Pt also reported frustration related to insurer's refusal to pay for pain management for tendon injury. Pt. Maintains that his ankle causes very bad pain, but has resisted pain medication because of concerns about dependence and fears that he will not be able to adequately care for his son while on medication. Pt. Reports frustration regarding his son's potty training. Pt. Recognizes that he is overprotective of his son which is motivated in part by his beliefs that the child's mother will take him in a public place. Pt. Currently wearing ankle boot to assist with mobility but reports that he does not wear the boot in public places and parks when with his son because he would not be able to run if he needed to rescue his child from danger. Pt. Resisted efforts to reality check pt.'s concerns about his son's safety.   Suicidal/Homicidal: Nowithout intent/plan   Therapist Response: Assessed patients current functioning and reviewed progress. Reviewed coping strategies. Assessed patients safety and assisted in identifying protective factors. Assisted patient with the expression of anxiety and frustration. Assessed progress related to self care.Used CBT to assist patient with the identification of negative distortions and irrational thoughts.   Plan: Pt. To continue with CBT based therapy. Return again in 4-6 weeks.   Diagnosis: Axis I: Adjustment Disorder with Mixed  Emotional Features   Axis II: No diagnosis   [name], Jennifer B, [facility] 10/09/2014

## 2014-10-13 ENCOUNTER — Telehealth: Payer: Self-pay | Admitting: Family Medicine

## 2014-10-13 NOTE — Telephone Encounter (Signed)
Will forward to MD to place referral if he feels this is appropriate. Jazmin Hartsell,CMA

## 2014-10-13 NOTE — Telephone Encounter (Signed)
Covering for Dr. Birdie SonsSonnenberg who is out of town.  Pt will need to be seen in our office prior to a referral since it has been over 9 months since we saw him for this issue. He is welcome to [facility] and attempt to schedule his own appointment, but it is not appropriate for us to refer him there via our office without seeing him first. Please inform patient.  Latrelle DodrillBrittany J Marvis Saefong, MD

## 2014-10-13 NOTE — Telephone Encounter (Signed)
Pt is requesting a referral to [facility], says he saw Dr. Birdie SonsSonnenberg July/2015 and mentioned a problem with achilles tendonitis and was referred to [facility], pt says he has not had any relief and would [facility].

## 2014-10-13 NOTE — Telephone Encounter (Signed)
Spoke with patient and he is aware of this.  States that he will check on transportation and care for his son then will call back to schedule an appt. Demica Zook,CMA

## 2014-10-14 ENCOUNTER — Ambulatory Visit (INDEPENDENT_AMBULATORY_CARE_PROVIDER_SITE_OTHER): Payer: Medicaid Other | Admitting: Family Medicine

## 2014-10-14 ENCOUNTER — Ambulatory Visit (INDEPENDENT_AMBULATORY_CARE_PROVIDER_SITE_OTHER): Payer: Federal, State, Local not specified - Other | Admitting: Psychiatry

## 2014-10-14 ENCOUNTER — Encounter (HOSPITAL_COMMUNITY): Payer: Self-pay | Admitting: Psychiatry

## 2014-10-14 ENCOUNTER — Encounter: Payer: Self-pay | Admitting: Family Medicine

## 2014-10-14 VITALS — BP 118/80 | HR 91 | Ht 69.0 in | Wt 181.0 lb

## 2014-10-14 VITALS — BP 121/75 | HR 95 | Temp 97.5°F | Wt 181.0 lb

## 2014-10-14 DIAGNOSIS — M7661 Achilles tendinitis, right leg: Secondary | ICD-10-CM

## 2014-10-14 DIAGNOSIS — F909 Attention-deficit hyperactivity disorder, unspecified type: Secondary | ICD-10-CM

## 2014-10-14 DIAGNOSIS — M67971 Unspecified disorder of synovium and tendon, right ankle and foot: Secondary | ICD-10-CM

## 2014-10-14 DIAGNOSIS — F4323 Adjustment disorder with mixed anxiety and depressed mood: Secondary | ICD-10-CM

## 2014-10-14 DIAGNOSIS — M67979 Unspecified disorder of synovium and tendon, unspecified ankle and foot: Secondary | ICD-10-CM | POA: Insufficient documentation

## 2014-10-14 MED ORDER — CLONAZEPAM 0.5 MG PO TABS: 0.5000 mg | ORAL_TABLET | Freq: Three times a day (TID) | ORAL | Status: DC | PRN

## 2014-10-14 MED ORDER — TRAMADOL HCL 50 MG PO TABS: 50.0000 mg | ORAL_TABLET | Freq: Three times a day (TID) | ORAL | Status: DC | PRN

## 2014-10-14 NOTE — Assessment & Plan Note (Signed)
Patient very much wants referral to [facility]. Pain has been present now greater than 3-6 months, so referral to orthopedic surgery is appropriate. -Also discussed importance of continuing to wear heel lift which he is. -Tramadol # 20 rx provided, but discussed only using for severe pain. Caution with narcotics due to concurrent benzodiazepine use. Prior PCP note mentioned referral to pain management for other issues. Patient says he has never been seen there. -Discussed no sudden dorsiflexion to avoid Achilles tendon rupture -Will forward note to orthopedics.

## 2014-10-14 NOTE — Progress Notes (Signed)
I was the preceptor for this visit. 

## 2014-10-14 NOTE — Progress Notes (Addendum)
[facility] MD Progress Note  10/14/2014 2:16 PM Kevin SizerJoshua Hall  MRN:  [id number] Subjective:  Stable Today the patient comes in with a boot on his right foot. This patient has chronic problems with his right Achilles tendon. He seen multiple physicians and is apply for disability. The patient is in chronic pain. Nonetheless he states care of his 45-year-old son regularly. He lives with his elderly mother who financially supports him. The patient denies significant depression. He is sleeping and eating well. He is not in any relationships this not at this time. The patient denies the use of alcohol or drugs. The patient is very stable. Is looking for to the time when his son will go to kindergarten but at this time he cannot afford preschool. The patient takes his Klonopin as prescribed and see Victorino DikeJennifer brown monthly. The patients anxiety is well contained. I do think this patient has some chronic psychosocial stressors to keep this patient on edge. I think the fact that he is dedicated to his son wants to spend much of his time with him is important. Ultimately if in fact he cannot get disability for his foot we'll have to rethink his occupational options. The patient has a ninth grade education. This patient is not suicidal. He is not homicidal. He's never shown any evidence of psychosis. He Principal Problem: Attention deficit disorder Diagnosis:   Patient Active Problem List   Diagnosis Date Noted  . 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 Achilles tendon disorder [M67.979] 10/14/2014  . Low back pain [M54.5] 01/09/2014  . HLD (hyperlipidemia) [E78.5] 01/09/2014  . Health care maintenance [Z00.00] 12/29/2013  . Tobacco abuse [Z72.0] 12/29/2013  . Sciatica [M54.30] 12/29/2013  . Adjustment disorder with mixed anxiety and depressed mood [F43.23] 11/06/2012  . Adjustment disorder with anxiety [F43.22] 05/16/2012   Total Time spent with patient: 30  minutes   Past Medical History:  Past Medical History  Diagnosis Date  . Anxiety   . Depression   . Shoulder dislocation     Past Surgical History  Procedure Laterality Date  . Knee arthroscopy    . Hernia repair    . Tonsillectomy and adenoidectomy     Family History:  Family History  Problem Relation Age of Onset  . Anxiety disorder Mother   . Depression Mother   . Diabetes Mother   . Hypertension Mother   . Thyroid disease Mother   . Cancer - Lung Father   . Alcohol abuse Father   . Cancer Father   . Bipolar disorder Brother   . Anxiety disorder Sister    Social History:  History  Alcohol Use  . 1.2 oz/week  . 2 Cans of beer per week    Comment: a few beers every month      History  Drug Use  . Yes  . Special: Marijuana, LSD    Comment: Drug use twenty years ago     History   Social History  . Marital Status: Divorced    Spouse Name: N/A  . Number of Children: N/A  . Years of Education: N/A   Social History Main Topics  . Smoking status: Current Every Day Smoker -- 0.50 packs/day    Types: Cigarettes  . Smokeless tobacco: Never Used  . Alcohol Use: 1.2 oz/week    2 Cans of beer per week     Comment: a few beers every month   . Drug Use: Yes  Special: Marijuana, LSD     Comment: Drug use twenty years ago   . Sexual Activity: Yes   Other Topics Concern  . None   Social History Narrative   Additional History:    Sleep: Good  Appetite:  Good   Assessment:   Musculoskeletal: Strength & Muscle Tone:  Gait & Station:  Patient leans:    Psychiatric Specialty Exam: Physical Exam  ROS  Blood pressure 118/80, pulse 91, height  (1.753 m), weight 181 lb (82.101 kg).Body mass index is 26.72 kg/(m^2).  General Appearance: Casual  Eye Contact::  Good  Speech:  Clear and Coherent  Volume:  Normal  Mood:  NA  Affect:  Appropriate  Thought Process:  Coherent  Orientation:  Full (Time, Place, and Person)  Thought Content:  WDL   Suicidal Thoughts:  No  Homicidal Thoughts:  No  Memory:  NA  Judgement:  Good  Insight:  Good  Psychomotor Activity:  Normal  Concentration:  Good  Recall:  Good  Fund of Knowledge:Good  Language: Good  Akathisia:  No  Handed:  Right  AIMS (if indicated):     Assets:  Desire for Improvement  ADL's:  Intact  Cognition: WNL  Sleep:        Current Medications: Current Outpatient Prescriptions  Medication Sig Dispense Refill  . acetaminophen-codeine (TYLENOL #3) 300-30 MG per tablet Take 1 tablet by mouth every 6 (six) hours as needed for moderate pain. 20 tablet 0  . clonazePAM (KLONOPIN) 0.5 MG tablet Take 1 tablet (0.5 mg total) by mouth 3 (three) times daily as needed for anxiety. 90 tablet 4  . cyclobenzaprine (FLEXERIL) 10 MG tablet Take 1 tablet (10 mg total) by mouth 3 (three) times daily as needed for muscle spasms. 30 tablet 0  . diclofenac sodium (VOLTAREN) 1 % GEL Apply 2 g topically 2 (two) times daily. Rub into affected area of foot 2 to 4 times daily 100 g 2  . ibuprofen (ADVIL,MOTRIN) 800 MG tablet Take 1 tablet (800 mg total) by mouth every 8 (eight) hours as needed. Takes  in the morning, and two tablets in the evening 30 tablet 0  . nabumetone (RELAFEN) 500 MG tablet Take 500 mg by mouth 2 (two) times daily as needed for mild pain.    . traMADol (ULTRAM) 50 MG tablet Take 1 tablet (50 mg total) by mouth every 8 (eight) hours as needed. 20 tablet 0   No current facility-administered medications for this visit.    Lab Results: No results found for this or any previous visit (from the past 48 hour(s)).  Physical Findings: AIMS:  , ,  ,  ,    CIWA:    COWS:     Treatment Plan Summary: At this time the patient will continue taking Klonopin 0.5 mg 3 times a day. I would likely and sitter this as an indefinite treatment until this patient is fully employed and his son is more independent. This patient does well and is functioning well on this medication. He  shows no evidence of abuse or misuse of this agent. This patient to return to see me in 4 months.   Medical Decision Making:  New problem, with additional work up planned     Nonie Lochner, [place] 10/14/2014, 2:16 PM

## 2014-10-14 NOTE — Patient Instructions (Addendum)
I have placed a referral for orthopedics. If you do not get a call in 1-2 weeks, please let us know. Please continue to use heel lifts. If the boot is helping, you can use it may be putting undue forces on your ankle causing pain. I'm prescribing short course of tramadol, but since you are seen at pain management be sure to notify them that this is prescribed acutely for worsened pain. Be sure not to do any sudden motions to avoid Achilles rupture.  Leona Singleton, MD  Achilles Tendinitis Achilles tendinitis is inflammation of the tough, cord-like band that attaches the lower muscles of your leg to your heel (Achilles tendon). It is usually caused by overusing the tendon and joint involved.  CAUSES Achilles tendinitis can happen because of:  A sudden increase in exercise or activity (such as running).  Doing the same exercises or activities (such as jumping) over and over.  Not warming up calf muscles before exercising.  Exercising in shoes that are worn out or not made for exercise.  Having arthritis or a bone growth on the back of the heel bone. This can rub against the tendon and hurt the tendon. SIGNS AND SYMPTOMS The most common symptoms are:  Pain in the back of the leg, just above the heel. The pain usually gets worse with exercise and better with rest.  Stiffness or soreness in the back of the leg, especially in the morning.  Swelling of the skin over the Achilles tendon.  Trouble standing on tiptoe. Sometimes, an Achilles tendon tears (ruptures). Symptoms of an Achilles tendon rupture can include:  Sudden, severe pain in the back of the leg.  Trouble putting weight on the foot or walking normally. DIAGNOSIS Achilles tendinitis will be diagnosed based on symptoms and a physical examination. An X-ray may be done to check if another condition is causing your symptoms. An MRI may be ordered if your health care provider suspects you may have completely torn your tendon,  which is called an Achilles tendon rupture.  TREATMENT  Achilles tendinitis usually gets better over time. It can take weeks to months to heal completely. Treatment focuses on treating the symptoms and helping the injury heal. HOME CARE INSTRUCTIONS   Rest your Achilles tendon and avoid activities that cause pain.  Apply ice to the injured area:  Put ice in a plastic bag.  Place a towel between your skin and the bag.  Leave the ice on for 20 minutes, 2-3 times a day  Try to avoid using the tendon (other than gentle range of motion) while the tendon is painful. Do not resume use until instructed by your health care provider. Then begin use gradually. Do not increase use to the point of pain. If pain does develop, decrease use and continue the above measures. Gradually increase activities that do not cause discomfort until you achieve normal use.  Do exercises to make your calf muscles stronger and more flexible. Your health care provider or physical therapist can recommend exercises for you to do.  Wrap your ankle with an elastic bandage or other wrap. This can help keep your tendon from moving too much. Your health care provider will show you how to wrap your ankle correctly.  Only take over-the-counter or prescription medicines for pain, discomfort, or fever as directed by your health care provider. SEEK MEDICAL CARE IF:   Your pain and swelling increase or pain is uncontrolled with medicines.  You develop new, unexplained symptoms or your  symptoms get worse.  You are unable to move your toes or foot.  You develop warmth and swelling in your foot.  You have an unexplained temperature. MAKE SURE YOU:   Understand these instructions.  Will watch your condition.  Will get help right away if you are not doing well or get worse. Document Released: 03/22/2005 Document Revised: 04/02/2013 Document Reviewed: 01/22/2013 [facility] Patient Information 2015 [employer] [place]. This  information is not intended to replace advice given to you by your health care provider. Make sure you discuss any questions you have with your health care provider.

## 2014-10-14 NOTE — Progress Notes (Signed)
Patient ID: Kevin Hall, male   DOB: Mar 08, 1971, 44 y.o.   MRN: [id number] Subjective:   CC: Right Achilles pain  HPI:   Patient presents for right Achilles pain present for greater than one year. It has gotten unbearable lately. [facility] who he has seen in the past for other MSK issues. He reports [facility] tendon pain and was told that it was torn. He was provided a boot which he wore for about 2 months, and on follow-up began transitioning to sneakers due to some improvement in pain. However, he reports that since that time, pain has again began worsening. He has been limping around quite a bit and is wearing his boot anytime he has to get around outside of the home. In the home, he takes it off due to ankle / knee pain while wearing the boot. He denies any fevers or chills. He has taken ibuprofen, Tylenol 3, and tramadol in the past which he reports did not help. However he is out of tramadol.  Review of Systems - Per HPI.   PMH - adjustment disorder with anxiety, ADD, tobacco abuse, sciatica, hyperlipidemia    Objective:  Physical Exam BP 121/75 mmHg  Pulse 95  Temp(Src) 97.5 F (36.4 C) (Oral)  Wt 181 lb (82.101 kg) GEN: NAD Extremities: Mid-Right Achilles tendon with obvious thickening, exquisitely tender to palpation, Squeeze test with no sign of rupture, antalgic gait    Assessment:     Kevin Hall is a 44 y.o. male here for right Achilles pain.    Plan:     # See problem list and after visit summary for problem-specific plans.   Follow-up: Follow up in 1-2 months PRN after appointment with ortho for f/u of achilles pain.   Leona SingletonMaria T Traci Gafford, MD [facility]

## 2014-10-22 ENCOUNTER — Telehealth: Payer: Self-pay | Admitting: Family Medicine

## 2014-10-22 NOTE — Telephone Encounter (Signed)
He can be referred to any ortho office in town.

## 2014-10-22 NOTE — Telephone Encounter (Signed)
Never received anything from [facility] about patient other than original appt date. There is no note from MD where the referral was for a "second opinion". According to Dr. Lucienne Minkshekkekandam's note "Patient very much [facility]. Pain has been present now greater than 3-6 months, so referral to orthopedic surgery is appropriate". I will forward this to patient's PCP to see what is the appropriate options for this patient. Kordel Leavy K

## 2014-10-22 NOTE — Telephone Encounter (Signed)
Pt called because he recent referral that office said that they would not see him for a second opinion. He is not sure why. Can we do another referral to a different office. jw

## 2014-10-22 NOTE — Telephone Encounter (Signed)
Will forward to referral coordinator to see if she received a fax regarding this patient.  Tyanna Hach,CMA

## 2014-11-03 ENCOUNTER — Encounter: Payer: Self-pay | Admitting: Podiatry

## 2014-11-06 ENCOUNTER — Ambulatory Visit: Payer: Medicaid Other | Admitting: Podiatry

## 2014-11-13 ENCOUNTER — Emergency Department (HOSPITAL_COMMUNITY)
Admission: EM | Admit: 2014-11-13 | Discharge: 2014-11-14 | Disposition: A | Discharge status: Home / Self Care | Payer: Medicaid Other | Attending: Emergency Medicine | Admitting: Emergency Medicine

## 2014-11-13 ENCOUNTER — Encounter (HOSPITAL_COMMUNITY): Payer: Self-pay | Admitting: Emergency Medicine

## 2014-11-13 DIAGNOSIS — F329 Major depressive disorder, single episode, unspecified: Secondary | ICD-10-CM | POA: Insufficient documentation

## 2014-11-13 DIAGNOSIS — F419 Anxiety disorder, unspecified: Secondary | ICD-10-CM | POA: Diagnosis not present

## 2014-11-13 DIAGNOSIS — K529 Noninfective gastroenteritis and colitis, unspecified: Secondary | ICD-10-CM | POA: Diagnosis not present

## 2014-11-13 DIAGNOSIS — Z7952 Long term (current) use of systemic steroids: Secondary | ICD-10-CM | POA: Insufficient documentation

## 2014-11-13 DIAGNOSIS — Z87828 Personal history of other (healed) physical injury and trauma: Secondary | ICD-10-CM | POA: Diagnosis not present

## 2014-11-13 DIAGNOSIS — Z720 Tobacco use: Secondary | ICD-10-CM | POA: Insufficient documentation

## 2014-11-13 DIAGNOSIS — R1084 Generalized abdominal pain: Secondary | ICD-10-CM | POA: Diagnosis present

## 2014-11-13 DIAGNOSIS — Z7982 Long term (current) use of aspirin: Secondary | ICD-10-CM | POA: Diagnosis not present

## 2014-11-13 LAB — COMPREHENSIVE METABOLIC PANEL
ALT: 17 U/L (ref 17–63)
AST: 18 U/L (ref 15–41)
Albumin: 4 g/dL (ref 3.5–5.0)
Alkaline Phosphatase: 53 U/L (ref 38–126)
Anion gap: 7 (ref 5–15)
BUN: 19 mg/dL (ref 6–20)
CALCIUM: 9 mg/dL (ref 8.9–10.3)
CO2: 25 mmol/L (ref 22–32)
Chloride: 106 mmol/L (ref 101–111)
Creatinine, Ser: 0.96 mg/dL (ref 0.61–1.24)
GLUCOSE: 117 mg/dL — AB (ref 65–99)
Potassium: 4.1 mmol/L (ref 3.5–5.1)
Sodium: 138 mmol/L (ref 135–145)
TOTAL PROTEIN: 6.6 g/dL (ref 6.5–8.1)
Total Bilirubin: 0.7 mg/dL (ref 0.3–1.2)

## 2014-11-13 LAB — CBC WITH DIFFERENTIAL/PLATELET
Basophils Absolute: 0 10*3/uL (ref 0.0–0.1)
Basophils Relative: 0 % (ref 0–1)
Eosinophils Absolute: 0.1 10*3/uL (ref 0.0–0.7)
Eosinophils Relative: 1 % (ref 0–5)
HEMATOCRIT: 43.5 % (ref 39.0–52.0)
Hemoglobin: 15.1 g/dL (ref 13.0–17.0)
LYMPHS ABS: 2 10*3/uL (ref 0.7–4.0)
Lymphocytes Relative: 18 % (ref 12–46)
MCH: 31.8 pg (ref 26.0–34.0)
MCHC: 34.7 g/dL (ref 30.0–36.0)
MCV: 91.6 fL (ref 78.0–100.0)
MONO ABS: 0.8 10*3/uL (ref 0.1–1.0)
MONOS PCT: 7 % (ref 3–12)
NEUTROS ABS: 8.3 10*3/uL — AB (ref 1.7–7.7)
NEUTROS PCT: 74 % (ref 43–77)
Platelets: 248 10*3/uL (ref 150–400)
RBC: 4.75 MIL/uL (ref 4.22–5.81)
RDW: 13.3 % (ref 11.5–15.5)
WBC: 11.1 10*3/uL — ABNORMAL HIGH (ref 4.0–10.5)

## 2014-11-13 LAB — URINALYSIS, ROUTINE W REFLEX MICROSCOPIC
BILIRUBIN URINE: NEGATIVE
Glucose, UA: NEGATIVE mg/dL
HGB URINE DIPSTICK: NEGATIVE
KETONES UR: NEGATIVE mg/dL
Leukocytes, UA: NEGATIVE
NITRITE: NEGATIVE
PH: 5 (ref 5.0–8.0)
Protein, ur: NEGATIVE mg/dL
SPECIFIC GRAVITY, URINE: 1.028 (ref 1.005–1.030)
Urobilinogen, UA: 0.2 mg/dL (ref 0.0–1.0)

## 2014-11-13 LAB — LIPASE, BLOOD: Lipase: 16 U/L — ABNORMAL LOW (ref 22–51)

## 2014-11-13 NOTE — ED Notes (Signed)
Pt. reports generalized abdominal pain , low back pain and bloody diarrhea onset today . Denies fever or chills.

## 2014-11-13 NOTE — ED Notes (Signed)
Pt requesting ativan to "relax"

## 2014-11-14 ENCOUNTER — Emergency Department (HOSPITAL_COMMUNITY): Payer: Medicaid Other

## 2014-11-14 ENCOUNTER — Encounter (HOSPITAL_COMMUNITY): Payer: Self-pay | Admitting: Radiology

## 2014-11-14 MED ORDER — IOHEXOL 300 MG/ML  SOLN: 25.0000 mL | Freq: Once | INTRAMUSCULAR | Status: DC | PRN

## 2014-11-14 MED ORDER — TRAMADOL HCL 50 MG PO TABS: 50.0000 mg | ORAL_TABLET | Freq: Four times a day (QID) | ORAL | Status: DC | PRN

## 2014-11-14 MED ORDER — CIPROFLOXACIN HCL 500 MG PO TABS: 500.0000 mg | ORAL_TABLET | Freq: Two times a day (BID) | ORAL | Status: DC

## 2014-11-14 MED ORDER — SODIUM CHLORIDE 0.9 % IV BOLUS (SEPSIS)
1000.0000 mL | INTRAVENOUS | Status: AC
  Administered 2014-11-14: 1000 mL via INTRAVENOUS

## 2014-11-14 MED ORDER — IOHEXOL 300 MG/ML  SOLN
80.0000 mL | Freq: Once | INTRAMUSCULAR | Status: AC | PRN
Start: 2014-11-14 — End: 2014-11-14
  Administered 2014-11-14: 80 mL via INTRAVENOUS

## 2014-11-14 MED ORDER — ONDANSETRON HCL 4 MG/2ML IJ SOLN
4.0000 mg | INTRAMUSCULAR | Status: AC
  Administered 2014-11-14: 4 mg via INTRAVENOUS
  Filled 2014-11-14: qty 2

## 2014-11-14 MED ORDER — MORPHINE SULFATE 4 MG/ML IJ SOLN
4.0000 mg | Freq: Once | INTRAMUSCULAR | Status: AC
  Administered 2014-11-14: 4 mg via INTRAVENOUS
  Filled 2014-11-14: qty 1

## 2014-11-14 MED ORDER — METRONIDAZOLE 500 MG PO TABS: 500.0000 mg | ORAL_TABLET | Freq: Two times a day (BID) | ORAL | Status: DC

## 2014-11-14 NOTE — ED Provider Notes (Signed)
CSN: [id number]     Arrival date & time 11/13/14  2211 History   First MD Initiated Contact with Patient 11/13/14 2316     Chief Complaint  Patient presents with  . Abdominal Pain   (Consider location/radiation/quality/duration/timing/severity/associated sxs/prior Treatment) HPI Kevin Hall is a 44 year old male presenting with abdominal pain. He states he had an episode of abdominal cramping after eating peanuts tonight. He states he's had an episode of abdominal cramping after eating peanuts several months ago. He states he felt the urge to have a bowel movement and had to strain finally having to loose bloody stools. He rates his abdominal pain currently as 7/10. He also has associated bilateral back pain. He reports some mild nausea but denies any fevers, chills, vomiting, diaphoresis or lightheadedness.   Past Medical History  Diagnosis Date  . Anxiety   . Depression   . Shoulder dislocation    Past Surgical History  Procedure Laterality Date  . Knee arthroscopy    . Hernia repair    . Tonsillectomy and adenoidectomy     Family History  Problem Relation Age of Onset  . Anxiety disorder Mother   . Depression Mother   . Diabetes Mother   . Hypertension Mother   . Thyroid disease Mother   . Cancer - Lung Father   . Alcohol abuse Father   . Cancer Father   . Bipolar disorder Brother   . Anxiety disorder Sister    History  Substance Use Topics  . Smoking status: Current Every Day Smoker -- 0.50 packs/day    Types: Cigarettes  . Smokeless tobacco: Never Used  . Alcohol Use: 1.2 oz/week    2 Cans of beer per week     Comment: a few beers every month     Review of Systems  Constitutional: Negative for fever and chills.  HENT: Negative for sore throat.   Eyes: Negative for visual disturbance.  Respiratory: Negative for cough and shortness of breath.   Cardiovascular: Negative for chest pain and leg swelling.  Gastrointestinal: Positive for nausea, vomiting,  abdominal pain, diarrhea and blood in stool.  Genitourinary: Negative for dysuria.  Musculoskeletal: Negative for myalgias.  Skin: Negative for rash.  Neurological: Negative for weakness, numbness and headaches.      Allergies  Review of patient's allergies indicates no known allergies.  Home Medications   Prior to Admission medications   Medication Sig Start Date End Date Taking? Authorizing Provider  aspirin 325 MG tablet Take 325 mg by mouth daily.   Yes Historical Provider, MD  clonazePAM (KLONOPIN) 0.5 MG tablet Take 1 tablet (0.5 mg total) by mouth 3 (three) times daily as needed for anxiety. 10/14/14 10/14/15 Yes Archer AsaGerald Plovsky, MD  predniSONE (STERAPRED UNI-PAK 21 TAB) 10 MG (21) TBPK tablet Take 10 mg by mouth daily.   Yes Historical Provider, MD  acetaminophen-codeine (TYLENOL #3) 300-30 MG per tablet Take 1 tablet by mouth every 6 (six) hours as needed for moderate pain. Patient not taking: Reported on 11/13/2014 07/03/14   Vivi BarrackMatthew R Wagoner, DPM  cyclobenzaprine (FLEXERIL) 10 MG tablet Take 1 tablet (10 mg total) by mouth 3 (three) times daily as needed for muscle spasms. Patient not taking: Reported on 11/13/2014 02/18/14   Ivonne AndrewPeter Dammen, PA-C  diclofenac sodium (VOLTAREN) 1 % GEL Apply 2 g topically 2 (two) times daily. Rub into affected area of foot 2 to 4 times daily Patient not taking: Reported on 11/13/2014 07/03/14   Vivi BarrackMatthew R Wagoner, DPM  ibuprofen (  ADVIL,MOTRIN) 800 MG tablet Take 1 tablet (800 mg total) by mouth every 8 (eight) hours as needed. Takes  in the morning, and two tablets in the evening Patient not taking: Reported on 11/13/2014 05/15/14   Vivi Barrack, DPM  traMADol (ULTRAM) 50 MG tablet Take 1 tablet (50 mg total) by mouth every 8 (eight) hours as needed. Patient not taking: Reported on 11/13/2014 10/14/14   Leona Singleton, MD   BP 104/61 mmHg  Pulse 61  Temp(Src) 98 F (36.7 C) (Oral)  Resp 16  SpO2 97% Physical Exam  Constitutional: He  appears well-developed and well-nourished. No distress.  HENT:  Head: Normocephalic and atraumatic.  Mouth/Throat: Oropharynx is clear and moist. No oropharyngeal exudate.  Eyes: Conjunctivae are normal.  Neck: Neck supple. No thyromegaly present.  Cardiovascular: Normal rate, regular rhythm and intact distal pulses.   Pulmonary/Chest: Effort normal and breath sounds normal. No respiratory distress. He has no wheezes. He has no rales. He exhibits no tenderness.  Abdominal: Soft. He exhibits distension. He exhibits no mass. There is generalized tenderness. There is no rebound and no guarding.    Musculoskeletal: He exhibits no tenderness.  Lymphadenopathy:    He has no cervical adenopathy.  Neurological: He is alert.  Skin: Skin is warm and dry. No rash noted. He is not diaphoretic.  Psychiatric: He has a normal mood and affect.  Nursing note and vitals reviewed.   ED Course  Procedures (including critical care time) Labs Review Labs Reviewed  CBC WITH DIFFERENTIAL/PLATELET - Abnormal; Notable for the following:    WBC 11.1 (*)    Neutro Abs 8.3 (*)    All other components within normal limits  COMPREHENSIVE METABOLIC PANEL - Abnormal; Notable for the following:    Glucose, Bld 117 (*)    All other components within normal limits  LIPASE, BLOOD - Abnormal; Notable for the following:    Lipase 16 (*)    All other components within normal limits  URINALYSIS, ROUTINE W REFLEX MICROSCOPIC - Abnormal; Notable for the following:    Color, Urine AMBER (*)    All other components within normal limits    Imaging Review Ct Abdomen Pelvis W Contrast  11/14/2014   CLINICAL DATA:  Stomach cramping since 5 p.m.  Mild diarrhea.  EXAM: CT ABDOMEN AND PELVIS WITH CONTRAST  TECHNIQUE: Multidetector CT imaging of the abdomen and pelvis was performed using the standard protocol following bolus administration of intravenous contrast.  CONTRAST:  80mL OMNIPAQUE IOHEXOL 300 MG/ML  SOLN  COMPARISON:   02/14/2010  FINDINGS: BODY WALL: Mild fatty enlargement of the left inguinal canal  LOWER CHEST: No contributory findings.  ABDOMEN/PELVIS:  Liver: No focal abnormality.  Biliary: No evidence of biliary obstruction or stone.  Pancreas: Unremarkable.  Spleen: Unremarkable.  Adrenals: Unremarkable.  Kidneys and ureters: No hydronephrosis or stone. Bilateral renal sinus cysts. There is retro caval course of the right ureter without obstruction.  Bladder: Chronic mild circumferential thickening, decreased from previous.  Reproductive: No pathologic findings.  Bowel: Borderline colonic wall thickening, most convincing at the splenic flexure and sigmoid. Negative appendix.  Retroperitoneum: No mass or adenopathy.  Peritoneum: No ascites or pneumoperitoneum.  Vascular: No acute abnormality.  OSSEOUS: No acute abnormalities.  IMPRESSION: Question mild colitis.  Otherwise, stable exam since 2011.   Electronically Signed   By: Marnee Spring M.D.   On: 11/14/2014 01:55     EKG Interpretation None      MDM   Final diagnoses:  Colitis   44 yo with abd cramping and distention after eating peanuts. Consider mesenteric ischemic vs diverticulitis. Dicussed case with Dr. Wilkie Aye. CBC, CMP, Lipase, UA, NS bolus, morphine, zofran and CT abd/pelvis ordered.  Pt's pain improved after treatment. Labs reviewed and no significant abnormalities. CT shows mild colitis. Discussed findings with Dr. Wilkie Aye. Prescription for cipro and flagyl provided. Pt is well-appearing, in no acute distress and vital signs reviewed and not concerning. He appears safe to be discharged.  Discharge include follow-up with their PCP and GI. Return precautions provided. Pt aware of plan and in agreement.   Filed Vitals:   11/14/14 0030 11/14/14 0045 11/14/14 0215 11/14/14 0231  BP: 128/65 119/72 111/49 109/62  Pulse: 56 66 63 73  Temp:    97.6 F (36.4 C)  TempSrc:    Oral  Resp:    18  SpO2: 96% 96% 95% 94%    Meds given in  ED:  Medications  sodium chloride 0.9 % bolus 1,000 mL (0 mLs Intravenous Stopped 11/14/14 0229)  ondansetron (ZOFRAN) injection 4 mg (4 mg Intravenous Given 11/14/14 0108)  morphine 4 MG/ML injection 4 mg (4 mg Intravenous Given 11/14/14 0108)  iohexol (OMNIPAQUE) 300 MG/ML solution 80 mL (80 mLs Intravenous Contrast Given 11/14/14 0129)    Discharge Medication List as of 11/14/2014  2:33 AM    START taking these medications   Details  ciprofloxacin (CIPRO) 500 MG tablet Take 1 tablet (500 mg total) by mouth 2 (two) times daily., Starting 11/14/2014, Until Discontinued, Print    metroNIDAZOLE (FLAGYL) 500 MG tablet Take 1 tablet (500 mg total) by mouth 2 (two) times daily., Starting 11/14/2014, Until Discontinued, Print          Harle Battiest, NP 11/14/14 0454  Shon Baton, MD 11/14/14 2248

## 2014-11-14 NOTE — Discharge Instructions (Signed)
Please follow directions provided. Be sure to follow-up with your primary care doctor later this week. Use the GI referral for further evaluation of the colitis. Please take both antibiotics as directed until they're all gone. You may use the pain medicine as needed. Don't hesitate to return for any new, worsening, or concerning symptoms.   SEEK IMMEDIATE MEDICAL CARE IF:  You develop chills.  You have an oral temperature above 102 F (38.9 C), not controlled by medicine.  You have extreme weakness, fainting, or dehydration.  You have repeated vomiting.  You develop severe belly (abdominal) pain or are passing bloody or tarry stools.

## 2014-12-25 ENCOUNTER — Ambulatory Visit (HOSPITAL_COMMUNITY): Payer: Self-pay | Admitting: Psychiatry

## 2015-01-04 ENCOUNTER — Emergency Department (HOSPITAL_COMMUNITY)
Admission: EM | Admit: 2015-01-04 | Discharge: 2015-01-04 | Disposition: A | Discharge status: Home / Self Care | Payer: Medicaid Other | Attending: Emergency Medicine | Admitting: Emergency Medicine

## 2015-01-04 ENCOUNTER — Encounter (HOSPITAL_COMMUNITY): Payer: Self-pay | Admitting: *Deleted

## 2015-01-04 DIAGNOSIS — R109 Unspecified abdominal pain: Secondary | ICD-10-CM | POA: Insufficient documentation

## 2015-01-04 DIAGNOSIS — G8929 Other chronic pain: Secondary | ICD-10-CM | POA: Insufficient documentation

## 2015-01-04 DIAGNOSIS — Z87828 Personal history of other (healed) physical injury and trauma: Secondary | ICD-10-CM | POA: Diagnosis not present

## 2015-01-04 DIAGNOSIS — M545 Low back pain, unspecified: Secondary | ICD-10-CM

## 2015-01-04 DIAGNOSIS — F329 Major depressive disorder, single episode, unspecified: Secondary | ICD-10-CM | POA: Diagnosis not present

## 2015-01-04 DIAGNOSIS — Z7952 Long term (current) use of systemic steroids: Secondary | ICD-10-CM | POA: Diagnosis not present

## 2015-01-04 DIAGNOSIS — Z720 Tobacco use: Secondary | ICD-10-CM | POA: Diagnosis not present

## 2015-01-04 DIAGNOSIS — Z7982 Long term (current) use of aspirin: Secondary | ICD-10-CM | POA: Insufficient documentation

## 2015-01-04 DIAGNOSIS — Z79899 Other long term (current) drug therapy: Secondary | ICD-10-CM | POA: Insufficient documentation

## 2015-01-04 DIAGNOSIS — F419 Anxiety disorder, unspecified: Secondary | ICD-10-CM | POA: Insufficient documentation

## 2015-01-04 HISTORY — DX: Achilles tendinitis, unspecified leg: M76.60

## 2015-01-04 MED ORDER — HYDROCODONE-ACETAMINOPHEN 5-325 MG PO TABS
1.0000 | ORAL_TABLET | Freq: Once | ORAL | Status: AC
  Administered 2015-01-04: 1 via ORAL
  Filled 2015-01-04: qty 1

## 2015-01-04 MED ORDER — METHOCARBAMOL 500 MG PO TABS
500.0000 mg | ORAL_TABLET | Freq: Once | ORAL | Status: AC
  Administered 2015-01-04: 500 mg via ORAL
  Filled 2015-01-04: qty 1

## 2015-01-04 MED ORDER — METHOCARBAMOL 500 MG PO TABS: 500.0000 mg | ORAL_TABLET | Freq: Two times a day (BID) | ORAL | Status: DC

## 2015-01-04 MED ORDER — MELOXICAM 15 MG PO TABS: 15.0000 mg | ORAL_TABLET | Freq: Every day | ORAL | Status: DC

## 2015-01-04 NOTE — Discharge Instructions (Signed)
1. Medications: robaxin,mobic, usual home medications 2. Treatment: rest, drink plenty of fluids, gentle stretching as discussed, alternate ice and heat 3. Follow Up: Please followup with your primary doctor in 3 days for discussion of your diagnoses and further evaluation after today's visit; if you do not have a primary care doctor use the resource guide provided to find one;  Return to the ER for worsening back pain, difficulty walking, loss of bowel or bladder control or other concerning symptoms     Back Exercises Back exercises help treat and prevent back injuries. The goal of back exercises is to increase the strength of your abdominal and back muscles and the flexibility of your back. These exercises should be started when you no longer have back pain. Back exercises include:  Pelvic Tilt. Lie on your back with your knees bent. Tilt your pelvis until the lower part of your back is against the floor. Hold this position 5 to 10 sec and repeat 5 to 10 times.  Knee to Chest. Pull first 1 knee up against your chest and hold for 20 to 30 seconds, repeat this with the other knee, and then both knees. This may be done with the other leg straight or bent, whichever feels better.  Sit-Ups or Curl-Ups. Bend your knees 90 degrees. Start with tilting your pelvis, and do a partial, slow sit-up, lifting your trunk only 30 to 45 degrees off the floor. Take at least 2 to 3 seconds for each sit-up. Do not do sit-ups with your knees out straight. If partial sit-ups are difficult, simply do the above but with only tightening your abdominal muscles and holding it as directed.  Hip-Lift. Lie on your back with your knees flexed 90 degrees. Push down with your feet and shoulders as you raise your hips a couple inches off the floor; hold for 10 seconds, repeat 5 to 10 times.  Back arches. Lie on your stomach, propping yourself up on bent elbows. Slowly press on your hands, causing an arch in your low back. Repeat 3  to 5 times. Any initial stiffness and discomfort should lessen with repetition over time.  Shoulder-Lifts. Lie face down with arms beside your body. Keep hips and torso pressed to floor as you slowly lift your head and shoulders off the floor. Do not overdo your exercises, especially in the beginning. Exercises may cause you some mild back discomfort which lasts for a few minutes; however, if the pain is more severe, or lasts for more than 15 minutes, do not continue exercises until you see your caregiver. Improvement with exercise therapy for back problems is slow.  See your caregivers for assistance with developing a proper back exercise program. Document Released: 07/20/2004 Document Revised: 09/04/2011 Document Reviewed: 04/13/2011 [facility] Patient Information 2015 [place] [place]. This information is not intended to replace advice given to you by your health care provider. Make sure you discuss any questions you have with your health care provider.

## 2015-01-04 NOTE — ED Provider Notes (Addendum)
CSN: [id number]     Arrival date & time 01/04/15  1341 History  This chart was scribed for Kevin ForthHannah Cataleyah Colborn, PA-C, working with Kevin HongBrian Miller, MD by Elon SpannerGarrett Cook, ED Scribe. This patient was seen in room TR01C/TR01C and the patient's care was started at 4:18 PM.   Chief Complaint  Patient presents with  . Back Pain   The history is provided by the patient. No language interpreter was used.   HPI Comments: [name] is a 44 y.o. male with a history of sciatica who presents to the Emergency Department complaining of an exacerbation of his chronic lower back pain onset yesterday.  The patient describes the pain as feeling like "fire" and like "someone is sticking a knuckle in my lower back."  He believes his pain was aggravated by the orthopaedic boot he wears and swimming with his son. The patient also reports radiation to the left sided abdomen onset > 1 year ago that is present only when he moves from sitting to standing and reaches a 45 degree angle.  It resolves with completing the movement and standing up completely.  He does not have the left abdominal pain at any other time.  No testicular pain.  Patient took 800 mg ibuprofen yesterday morning without relief.  He is not followed for this complaint currently, but is seen by Dr. Juliene PinaGeoffrey for achilles tendonitis.  He denies history of back surgery.  There is no history of cancer, IV drug use. He does not use anti-coagulants. He denies radiation to testicles, leg numbness, bowel/bladder incontinence, gait distrubance.  Patient is in the process of applying for disability   Past Medical History  Diagnosis Date  . Anxiety   . Depression   . Shoulder dislocation   . Achilles tendinitis    Past Surgical History  Procedure Laterality Date  . Knee arthroscopy    . Hernia repair    . Tonsillectomy and adenoidectomy     Family History  Problem Relation Age of Onset  . Anxiety disorder Mother   . Depression Mother   . Diabetes Mother   .  Hypertension Mother   . Thyroid disease Mother   . Cancer - Lung Father   . Alcohol abuse Father   . Cancer Father   . Bipolar disorder Brother   . Anxiety disorder Sister    History  Substance Use Topics  . Smoking status: Current Every Day Smoker -- 0.50 packs/day    Types: Cigarettes  . Smokeless tobacco: Never Used  . Alcohol Use: 1.2 oz/week    2 Cans of beer per week     Comment: a few beers every month     Review of Systems  Constitutional: Negative for fever and fatigue.  Respiratory: Negative for chest tightness and shortness of breath.   Cardiovascular: Negative for chest pain.  Gastrointestinal: Positive for abdominal pain (left lower with radiation to his back). Negative for nausea, vomiting and diarrhea.  Genitourinary: Negative for dysuria, urgency, frequency and hematuria.  Musculoskeletal: Positive for back pain and arthralgias. Negative for joint swelling, gait problem, neck pain and neck stiffness.  Skin: Negative for rash.  Neurological: Negative for weakness, light-headedness, numbness and headaches.  All other systems reviewed and are negative.     Allergies  Review of patient's allergies indicates no known allergies.  Home Medications   Prior to Admission medications   Medication Sig Start Date End Date Taking? Authorizing Provider  acetaminophen-codeine (TYLENOL #3) 300-30 MG per tablet Take 1 tablet  by mouth every 6 (six) hours as needed for moderate pain. Patient not taking: Reported on 11/13/2014 07/03/14   Vivi Barrack, DPM  aspirin 325 MG tablet Take 325 mg by mouth daily.    Historical Provider, MD  ciprofloxacin (CIPRO) 500 MG tablet Take 1 tablet (500 mg total) by mouth 2 (two) times daily. 11/14/14   Harle Battiest, NP  clonazePAM (KLONOPIN) 0.5 MG tablet Take 1 tablet (0.5 mg total) by mouth 3 (three) times daily as needed for anxiety. 10/14/14 10/14/15  Archer Asa, MD  cyclobenzaprine (FLEXERIL) 10 MG tablet Take 1 tablet (10 mg  total) by mouth 3 (three) times daily as needed for muscle spasms. Patient not taking: Reported on 11/13/2014 02/18/14   Ivonne Andrew, PA-C  diclofenac sodium (VOLTAREN) 1 % GEL Apply 2 g topically 2 (two) times daily. Rub into affected area of foot 2 to 4 times daily Patient not taking: Reported on 11/13/2014 07/03/14   Vivi Barrack, DPM  ibuprofen (ADVIL,MOTRIN) 800 MG tablet Take 1 tablet (800 mg total) by mouth every 8 (eight) hours as needed. Takes 800mg  in the morning, and two tablets in the evening Patient not taking: Reported on 11/13/2014 05/15/14   Vivi Barrack, DPM  meloxicam (MOBIC) 15 MG tablet Take 1 tablet (15 mg total) by mouth daily. 01/04/15   Thaer Miyoshi, PA-C  methocarbamol (ROBAXIN) 500 MG tablet Take 1 tablet (500 mg total) by mouth 2 (two) times daily. 01/04/15   Britiney Blahnik, PA-C  metroNIDAZOLE (FLAGYL) 500 MG tablet Take 1 tablet (500 mg total) by mouth 2 (two) times daily. 11/14/14   Harle Battiest, NP  predniSONE (STERAPRED UNI-PAK 21 TAB) 10 MG (21) TBPK tablet Take 10 mg by mouth daily.    Historical Provider, MD  traMADol (ULTRAM) 50 MG tablet Take 1 tablet (50 mg total) by mouth every 6 (six) hours as needed. 11/14/14   Harle Battiest, NP   BP 109/60 mmHg  Pulse 73  Temp(Src) 97.8 F (36.6 C) (Oral)  Resp 18  Wt 184 lb 9.6 oz (83.734 kg)  SpO2 99% Physical Exam  Constitutional: He appears well-developed and well-nourished. No distress.  HENT:  Head: Normocephalic and atraumatic.  Mouth/Throat: Oropharynx is clear and moist. No oropharyngeal exudate.  Eyes: Conjunctivae are normal.  Neck: Normal range of motion. Neck supple.  Full ROM without pain  Cardiovascular: Normal rate, regular rhythm, normal heart sounds and intact distal pulses.   Pulmonary/Chest: Effort normal and breath sounds normal. No respiratory distress. He has no wheezes.  Abdominal: Soft. He exhibits no distension, no abdominal bruit, no pulsatile midline mass and no  mass. There is no tenderness. There is no rigidity, no rebound, no guarding and no CVA tenderness. Hernia confirmed negative in the right inguinal area and confirmed negative in the left inguinal area.  No abdominal tenderness; no back pain with palpation of the abdomin Soft without rebound or guarding No palpable masses No audible bruits  Genitourinary: Testes normal and penis normal. Right testis shows no mass, no swelling and no tenderness. Right testis is descended. Cremasteric reflex is not absent on the right side. Left testis shows no mass, no swelling and no tenderness. Left testis is descended. Cremasteric reflex is not absent on the left side. Circumcised. No phimosis, paraphimosis, hypospadias, penile erythema or penile tenderness. No discharge found.  NO hernias  Musculoskeletal:  Full range of motion of the T-spine and L-spine No tenderness to palpation of the spinous processes of the T-spine or  L-spine Mild tenderness to palpation of the bilateral paraspinous muscles of the L-spine Negative straight leg raise Unable to reproduce radiation of pain into the left abd with any motion of the hips or low back  Lymphadenopathy:    He has no cervical adenopathy.       Right: No inguinal adenopathy present.       Left: No inguinal adenopathy present.  Neurological: He is alert. He has normal reflexes.  Reflex Scores:      Bicep reflexes are 2+ on the right side and 2+ on the left side.      Brachioradialis reflexes are 2+ on the right side and 2+ on the left side.      Patellar reflexes are 2+ on the right side and 2+ on the left side.      Achilles reflexes are 2+ on the right side and 2+ on the left side. Speech is clear and goal oriented, follows commands Normal 5/5 strength in upper and lower extremities bilaterally including dorsiflexion and plantar flexion, strong and equal grip strength Sensation normal to light and sharp touch Moves extremities without ataxia, coordination  intact Normal gait Normal balance No Clonus   Skin: Skin is warm and dry. No rash noted. He is not diaphoretic. No erythema.  Psychiatric: He has a normal mood and affect. His behavior is normal.  Nursing note and vitals reviewed.   ED Course  Procedures (including critical care time)\  DIAGNOSTIC STUDIES: Oxygen Saturation is 100% on RA, normal by my interpretation.    COORDINATION OF CARE:  4:26 PM Will prescribe muscle relaxer, anti-inflammatory.  Patient should f[facility] with [facility].  Patient acknowledges and agrees with plan.    Labs Review Labs Reviewed - No data to display  Imaging Review No results found.   EKG Interpretation None       EMERGENCY DEPARTMENT [place] ABD/AORTA EXAM Study: Limited Ultrasound of the Abdominal Aorta.  INDICATIONS:Back pain Indication: Multiple views of the abdominal aorta are obtained from the diaphragmatic hiatus to the aortic bifurcation in transverse and sagittal planes with a multi- Frequency probe.  PERFORMED BY: Myself and Dr. Hyacinth Meeker  IMAGES ARCHIVED?: Yes  FINDINGS: Maximum aortic dimensions are 2.69  LIMITATIONS:  Bowel gas  INTERPRETATION:  No abdominal aortic aneurysm  COMMENT:  No evidence of AAA   MDM   Final diagnoses:  Bilateral low back pain without sciatica  Acute exacerbation of chronic low back pain   Grantham Hippert presents with acute exacerbation of low back pain.  Patient with back pain.  No neurological deficits and normal neuro exam.  Patient walks without gait disturbance.  He has no restriction of spinal movement.  No loss of bowel or bladder control.  No concern for cauda equina.  No fever, night sweats, weight loss, h/o cancer, IVDU.  No hernias on exam.  No midline abd mass, abd bruit, abd tenderness or reproducible abd pain.  Doubt AAA (pt < age 68, no fhx of AAA, no HTN, no hx of atherosclerosis, no hx of CAD or PVD - pt is a white, male, smoker). RICE protocol and muscle relaxer indicated  and discussed with patient.  Recommended pt f/u with PCP for further evaluation of back and abd pain.  Discussed reasons to return immediately to the ED for worsening pain, increasing radiation of pain or other concerns.    BP 109/60 mmHg  Pulse 73  Temp(Src) 97.8 F (36.6 C) (Oral)  Resp 18  Wt 184 lb 9.6 oz (  83.734 kg)  SpO2 99%    Kevin Forth, PA-C 01/04/15 1702  Kevin Hong, MD 01/04/15 1727  Paradise Vensel, PA-C 01/04/15 1753  Kevin Hong, MD 01/05/15 [phone]

## 2015-01-04 NOTE — ED Notes (Signed)
Declined W/C at D/C and was escorted to lobby by RN. 

## 2015-01-04 NOTE — ED Notes (Signed)
Patient with lower back pain.  States he swam all last week.  Patient states it feels like somone is sticking knuckle into his lower back.  Patient states he has hx of some back pain but increased on yesterday.  Patient took ibuprofen yesterday w/o relief.  Patient states he did not taken any pain meds today,  Patient denies any urinary sx.  No n/v/d.  Patient denies fevers.  He also states he gets a pain in the left groin when he stands up.

## 2015-01-07 ENCOUNTER — Ambulatory Visit (HOSPITAL_COMMUNITY): Payer: Self-pay | Admitting: Psychiatry

## 2015-01-08 ENCOUNTER — Ambulatory Visit (HOSPITAL_COMMUNITY): Payer: Self-pay | Admitting: Psychiatry

## 2015-01-21 ENCOUNTER — Ambulatory Visit (HOSPITAL_COMMUNITY): Payer: Self-pay | Admitting: Psychiatry

## 2015-01-28 ENCOUNTER — Ambulatory Visit (HOSPITAL_COMMUNITY): Payer: Self-pay | Admitting: Psychiatry

## 2015-02-04 ENCOUNTER — Ambulatory Visit (HOSPITAL_COMMUNITY): Payer: Self-pay | Admitting: Psychiatry

## 2015-02-08 ENCOUNTER — Encounter: Payer: Self-pay | Admitting: Physical Therapy

## 2015-02-08 ENCOUNTER — Ambulatory Visit: Payer: Medicaid Other | Attending: Orthopedic Surgery | Admitting: Physical Therapy

## 2015-02-08 DIAGNOSIS — G8929 Other chronic pain: Secondary | ICD-10-CM | POA: Diagnosis present

## 2015-02-08 DIAGNOSIS — M25571 Pain in right ankle and joints of right foot: Secondary | ICD-10-CM

## 2015-02-08 DIAGNOSIS — R262 Difficulty in walking, not elsewhere classified: Secondary | ICD-10-CM | POA: Diagnosis present

## 2015-02-08 DIAGNOSIS — M25561 Pain in right knee: Secondary | ICD-10-CM | POA: Diagnosis present

## 2015-02-08 NOTE — Therapy (Addendum)
[facility] [address] [place], [place], [address] Phone: [phone]   Fax:  [phone]  Physical Therapy Evaluation  Patient Details  Name: Kevin Hall MRN: [id number] Date of Birth: December 26, 1970 Referring Provider:  Toni Arthurs, MD  Encounter Date: 02/08/2015      PT End of Session - 02/08/15 1232    Visit Number 1   Number of Visits 1   PT Start Time 1020   PT Stop Time 1108   PT Time Calculation (min) 48 min   Activity Tolerance Patient tolerated treatment well   Behavior During Therapy [facility] for tasks assessed/performed      Past Medical History  Diagnosis Date  . Anxiety   . Depression   . Shoulder dislocation   . Achilles tendinitis     Past Surgical History  Procedure Laterality Date  . Knee arthroscopy    . Hernia repair    . Tonsillectomy and adenoidectomy      There were no vitals filed for this visit.  Visit Diagnosis:  Pain in joint, ankle and foot, right  Difficulty in walking involving ankle and foot joint  Knee pain, chronic, right      Subjective Assessment - 02/08/15 1018    Subjective Pt with chronic Rt. achilles pain, since 2015.  In April he was referred to  Dr. Ophelia Charter. HE was given a CAM boot which he felt worsened the problem.  He was able to get MRI eventually but does not know results. He still wears boot intermittently when he uses public transportation.  He complains of pain, swelling and locking in Rt. ankle.    Pertinent History knee surgery in 2009 to remove meniscus, chronic achilles tendinitis, anxiety, back pain. Pt has not had TKR which he somewhere read in his chart.     Limitations Standing;Walking;Other (comment)  exercise   Diagnostic tests MRI per patient, no results.    Patient Stated Goals pt wants pain relief and to be able to wwalk without pain.    Currently in Pain? Yes  not at rest   Pain Score 7   in AM, 7/10    Pain Location Ankle   Pain Orientation  Right;Posterior   Pain Type Chronic pain   Pain Onset More than a month ago   Pain Frequency Intermittent   Aggravating Factors  AM, walking, standing    Pain Relieving Factors patches, heel raises? has not tried ice   Effect of Pain on Daily Activities has not been able to exercise much.    Multiple Pain Sites No            [facility] PT Assessment - 02/08/15 1028    Assessment   Medical Diagnosis Rt achilles tendonitis   Onset Date/Surgical Date --  chronic   Prior Therapy none   Precautions   Precautions None   Restrictions   Weight Bearing Restrictions No   Balance Screen   Has the patient fallen in the past 6 months No   Prior Function   Level of Independence Independent   Cognition   Overall Cognitive Status Within Functional Limits for tasks assessed   Observation/Other Assessments   Focus on Therapeutic Outcomes (FOTO)  NT   Sensation   Light Touch Appears Intact   Coordination   Gross Motor Movements are Fluid and Coordinated Not tested   Posture/Postural Control   Posture/Postural Control Postural limitations   Postural Limitations Flexed trunk;Weight shift left   Posture Comments ankles rigid in  supination with gait    AROM   Right/Left Ankle --  INV/EV WNL.   Right Ankle Dorsiflexion 5   Right Ankle Plantar Flexion 42   Strength   Right Ankle Dorsiflexion 5/5   Right Ankle Plantar Flexion 4/5   Right Ankle Inversion 4/5   Right Ankle Eversion 4+/5   Palpation   Patella mobility Rt. patella tracks laterally   Palpation comment swelling, bump on Rt. achilles tendon , painful  Gastroc very tight and painful    Ambulation/Gait   Ambulation/Gait Yes   Ambulation/Gait Assistance 7: Independent   Ambulation Distance (Feet) 300 Feet   Gait Pattern Decreased hip/knee flexion - right;Decreased hip/knee flexion - left;Antalgic;Trunk flexed;Wide base of support;Abducted - left;Abducted- right   Ambulation Surface Level   Static Standing Balance   Static Standing  - Balance Support No upper extremity supported   Static Standing - Level of Assistance 6: Modified independent (Device/Increase time)   Static Standing Balance -  Activities  Single Leg Stance - Right Leg;Single Leg Stance - Left Leg   Static Standing - Comment/# of Minutes < 15 sec bilat.            PT Education - 02/08/15 1230    Education provided Yes   Education Details PT/POC, financial assist, tennis ball, HEP for eccentric strength    Person(s) Educated Patient   Methods Explanation;Demonstration;Handout;Tactile cues;Verbal cues   Comprehension Returned demonstration;Verbalized understanding           Plan - 02/08/15 1233    Clinical Impression Statement Patient reports with chronic issues with Rt. LE, worsening since April 2016.  He presents with pain, inflammation, decreased flexibility, abnormal posture, mild weakness, stiffness and difficulty walking. He will be unable to complte PT here in the clinic due to financial reasons ([facility] does not pay for this diagnosis).  He was given HEP, recommended [employer] asst if he would like to to apply.    Pt will benefit from skilled therapeutic intervention in order to improve on the following deficits Abnormal gait;Difficulty walking;Postural dysfunction;Decreased mobility;Decreased activity tolerance;Impaired flexibility;Pain;Increased edema;Increased fascial restricitons   Rehab Potential Good   PT Frequency One time visit  due to [facility] restrictions/coverage   PT Treatment/Interventions Manual techniques;Therapeutic exercise;ADLs/Self Care Home Management   PT Next Visit Plan NA   PT Home Exercise Plan eccentric calf, tennis ball for MFR and ice   Consulted and Agree with Plan of Care Patient         Problem List Patient Active Problem List   Diagnosis Date Noted  . Achilles tendon disorder 10/14/2014  . Low back pain 01/09/2014  . HLD (hyperlipidemia) 01/09/2014  . Health care maintenance 12/29/2013  . Tobacco abuse  12/29/2013  . Sciatica 12/29/2013  . Adjustment disorder with mixed anxiety and depressed mood 11/06/2012  . 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 Adjustment disorder with anxiety 05/16/2012    PAA,JENNIFER 02/08/2015, 2:25 PM  [facility] [address] [place], [place], [address] Phone: [phone]   Fax:  [phone]   Karie Mainland, PT 02/08/2015 2:25 PM Phone: [phone] Fax: [phone]

## 2015-02-18 ENCOUNTER — Encounter (HOSPITAL_COMMUNITY): Payer: Self-pay | Admitting: Psychiatry

## 2015-02-18 ENCOUNTER — Ambulatory Visit (INDEPENDENT_AMBULATORY_CARE_PROVIDER_SITE_OTHER): Payer: Federal, State, Local not specified - Other | Admitting: Psychiatry

## 2015-02-18 VITALS — BP 108/72 | HR 82 | Ht 69.0 in | Wt 183.6 lb

## 2015-02-18 DIAGNOSIS — F4322 Adjustment disorder with anxiety: Secondary | ICD-10-CM

## 2015-02-18 DIAGNOSIS — F4323 Adjustment disorder with mixed anxiety and depressed mood: Secondary | ICD-10-CM

## 2015-02-18 MED ORDER — CLONAZEPAM 0.5 MG PO TABS: 0.5000 mg | ORAL_TABLET | Freq: Three times a day (TID) | ORAL | Status: DC | PRN

## 2015-02-18 NOTE — Progress Notes (Signed)
[facility] MD Progress Note  02/18/2015 1:58 PM Kevin Hall  MRN:  [id number] Subjective: Unchanged stable Principal Problem: Adjustment disorder with anxiety Diagnosis: Adjustment disorder with anxiety At this time the patient is stable. This year he sat up it is not any get disability list he appeals and I think is can he do that. He finally got into an orthopedic clinic and saw a podiatrist. They said surgery is not indicated for his right foot. They've given him some exercises and imply that he should have some physical therapy. He's frustrated that Medicaid will not pay for his physical therapy. Nonetheless is putting some nitroglycerin on his legs which apparently is helping his blood flow. He believes it is helpful. The patient has significant amount of discomfort in the mornings and takes a while to get going. He now understands himself as not to be able to have a lot of physical strength and that limits his job opportunities. He doesn't like not working and he would rather work. He is eating and sleeping and he is concentrating fairly well. He denies any anxiety and does well taking Klonopin as prescribed. He does not use alcohol or drugs. He is taking excellent care of his son who Z young child. His mother is stable. The patient is not suicidal. He denies any physical symptoms like chest pain or shortness of breath. Physically besides his ankle and problems with his right knee he is very stable. Emotionally he is stable. Patient Active Problem List   Diagnosis Date Noted  . 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 Achilles tendon disorder [M67.979] 10/14/2014  . Low back pain [M54.5] 01/09/2014  . HLD (hyperlipidemia) [E78.5] 01/09/2014  . Health care maintenance [Z00.00] 12/29/2013  . Tobacco abuse [Z72.0] 12/29/2013  . Sciatica [M54.30] 12/29/2013  . Adjustment disorder with mixed anxiety and depressed mood [F43.23] 11/06/2012  .  Adjustment disorder with anxiety [F43.22] 05/16/2012   Total Time spent with patient: 30 minutes   Past Medical History:  Past Medical History  Diagnosis Date  . Anxiety   . Depression   . Shoulder dislocation   . Achilles tendinitis     Past Surgical History  Procedure Laterality Date  . Knee arthroscopy    . Hernia repair    . Tonsillectomy and adenoidectomy     Family History:  Family History  Problem Relation Age of Onset  . Anxiety disorder Mother   . Depression Mother   . Diabetes Mother   . Hypertension Mother   . Thyroid disease Mother   . Cancer - Lung Father   . Alcohol abuse Father   . Cancer Father   . Bipolar disorder Brother   . Anxiety disorder Sister    Social History:  History  Alcohol Use No    Comment: a few beers every month      History  Drug Use No    Comment: Drug use twenty years ago     Social History   Social History  . Marital Status: Divorced    Spouse Name: N/A  . Number of Children: N/A  . Years of Education: N/A   Social History Main Topics  . Smoking status: Current Every Day Smoker -- 0.50 packs/day    Types: Cigarettes  . Smokeless tobacco: Never Used  . Alcohol Use: No     Comment: a few beers every month   . Drug Use: No     Comment: Drug  use twenty years ago   . Sexual Activity: Not Currently   Other Topics Concern  . None   Social History Narrative   Additional History:    Sleep: Good  Appetite:  Good   Assessment:   Musculoskeletal: Strength & Muscle Tone: within normal limits Gait & Station: normal Patient leans: Right   Psychiatric Specialty Exam: Physical Exam  ROS  Blood pressure 108/72, pulse 82, height 5\' 9"  (1.753 m), weight 183 lb 9.6 oz (83.28 kg).Body mass index is 27.1 kg/(m^2).  General Appearance: Casual  Eye Contact::  Good  Speech:  Clear and Coherent  Volume:  Normal  Mood:  Euthymic  Affect:  Appropriate  Thought Process:  Coherent  Orientation:  Full (Time, Place, and  Person)  Thought Content:  WDL  Suicidal Thoughts:  No  Homicidal Thoughts:  No  Memory:  NA  Judgement:  Good  Insight:  Good  Psychomotor Activity:  Normal  Concentration:  Good  Recall:  Good  Fund of Knowledge:Good  Language: Good  Akathisia:  No  Handed:  Right  AIMS (if indicated):     Assets:  Desire for Improvement  ADL's:  Intact  Cognition: WNL  Sleep:        Current Medications: Current Outpatient Prescriptions  Medication Sig Dispense Refill  . acetaminophen-codeine (TYLENOL #3) 300-30 MG per tablet Take 1 tablet by mouth every 6 (six) hours as needed for moderate pain. (Patient not taking: Reported on 02/08/2015) 20 tablet 0  . aspirin 325 MG tablet Take 325 mg by mouth daily.    . ciprofloxacin (CIPRO) 500 MG tablet Take 1 tablet (500 mg total) by mouth 2 (two) times daily. (Patient not taking: Reported on 02/08/2015) 14 tablet 0  . clonazePAM (KLONOPIN) 0.5 MG tablet Take 1 tablet (0.5 mg total) by mouth 3 (three) times daily as needed for anxiety. 90 tablet 4  . cyclobenzaprine (FLEXERIL) 10 MG tablet Take 1 tablet (10 mg total) by mouth 3 (three) times daily as needed for muscle spasms. (Patient not taking: Reported on 02/08/2015) 30 tablet 0  . diclofenac sodium (VOLTAREN) 1 % GEL Apply 2 g topically 2 (two) times daily. Rub into affected area of foot 2 to 4 times daily (Patient not taking: Reported on 02/08/2015) 100 g 2  . ibuprofen (ADVIL,MOTRIN) 800 MG tablet Take 1 tablet (800 mg total) by mouth every 8 (eight) hours as needed. Takes 800mg  in the morning, and two tablets in the evening (Patient not taking: Reported on 02/08/2015) 30 tablet 0  . meloxicam (MOBIC) 15 MG tablet Take 1 tablet (15 mg total) by mouth daily. (Patient not taking: Reported on 02/08/2015) 30 tablet 0  . methocarbamol (ROBAXIN) 500 MG tablet Take 1 tablet (500 mg total) by mouth 2 (two) times daily. (Patient not taking: Reported on 02/08/2015) 20 tablet 0  . metroNIDAZOLE (FLAGYL) 500 MG tablet  Take 1 tablet (500 mg total) by mouth 2 (two) times daily. (Patient not taking: Reported on 02/08/2015) 14 tablet 0  . nitroGLYCERIN (NITRODUR - DOSED IN MG/24 HR) 0.2 mg/hr patch Place 0.2 mg onto the skin daily.    . predniSONE (STERAPRED UNI-PAK 21 TAB) 10 MG (21) TBPK tablet Take 10 mg by mouth daily.    . traMADol (ULTRAM) 50 MG tablet Take 1 tablet (50 mg total) by mouth every 6 (six) hours as needed. (Patient not taking: Reported on 02/08/2015) 15 tablet 0   No current facility-administered medications for this visit.    Lab  Results: No results found for this or any previous visit (from the past 48 hour(s)).  Physical Findings: AIMS:  , ,  ,  ,    CIWA:    COWS:     Treatment Plan Summary:    Medical Decision Making:  Self-Limited or Minor (1) Patient is doing well. At this time will continue taking Klonopin 0.5 mg 3 times a day. The patient does not drink any alcohol or use any drugs. He's getting along as best he can caring for his son. He is the primary caregiver. His mother is stable. Overall I believe his right leg is somewhat improved. Today we spent over 50% of the time educating him about the use of Klonopin. The patient seems stable to me. He is not suicidal.    Kevin Hall IRVING 02/18/2015, 1:58 PM

## 2015-02-22 ENCOUNTER — Ambulatory Visit: Payer: Medicaid Other

## 2015-03-02 ENCOUNTER — Ambulatory Visit (INDEPENDENT_AMBULATORY_CARE_PROVIDER_SITE_OTHER): Payer: Federal, State, Local not specified - Other | Admitting: Psychiatry

## 2015-03-02 DIAGNOSIS — F4323 Adjustment disorder with mixed anxiety and depressed mood: Secondary | ICD-10-CM | POA: Diagnosis not present

## 2015-03-02 NOTE — Progress Notes (Signed)
   THERAPIST PROGRESS NOTE   Session Time: 1:05-2:00   Participation Level: Active   Behavioral Response: Well GroomedAlertAnxious   Type of Therapy: Individual Therapy   Treatment Goals addressed: Anxiety and Coping   Interventions: CBT, Solution Focused, Strength-based, Supportive and Reframing   Summary: Pt. Continues to present as talkative, anxious. Pt. Reports that he has ongoing legal problems related to denial of disability. Pt. Expressed frustration with his attorney for not returning his phone calls and answering questions; this frustration is causing anxiety. Counselor suggested that he write down his questions to his attorney so that he can experience less anxiety about communicating his information needs effectively. Pt. Discussed frustrations with his leg pain and shared leg exercises that were recommended to strengthen his calf muscles. Counselor shared breathing exercise that he can combine with his leg exercises (4-3-8) breathing that will assist in calming anxiety and reminder to slow breathing. Pt. Discussed anxiety related to parenting his 36 year old son who was recently diagnosed with partial blindness. Pt. Was encouraged to exercise self-compassion and beliefs about challenge beliefs about perfection as a parent.   Suicidal/Homicidal: Nowithout intent/plan   Therapist Response: Assessed patients current functioning and reviewed progress. Reviewed coping strategies. Assessed patients safety and assisted in identifying protective factors. Assisted patient with the expression of anxiety and frustration. Used breathing exercises to assist with stress and pain management.  Plan: Pt. To continue with CBT based therapy. Return again in 4-6 weeks.   Diagnosis: Axis I: Adjustment Disorder with Mixed Emotional Features   Axis II: No diagnosis  [name], [place] 03/02/2015

## 2015-03-18 ENCOUNTER — Encounter: Payer: Self-pay | Admitting: Student

## 2015-03-18 ENCOUNTER — Ambulatory Visit (INDEPENDENT_AMBULATORY_CARE_PROVIDER_SITE_OTHER): Payer: Medicaid Other | Admitting: Student

## 2015-03-18 VITALS — BP 110/98 | HR 91 | Wt 180.0 lb

## 2015-03-18 DIAGNOSIS — M67971 Unspecified disorder of synovium and tendon, right ankle and foot: Secondary | ICD-10-CM | POA: Diagnosis not present

## 2015-03-18 MED ORDER — TRAMADOL HCL 50 MG PO TABS: 50.0000 mg | ORAL_TABLET | Freq: Three times a day (TID) | ORAL | Status: DC | PRN

## 2015-03-18 NOTE — Patient Instructions (Signed)
It was great seeing you today! We have addressed the following issues today   Right leg pain: I gave you prescription for tramadol 20 tablets. Take this three time as day as needed for pain.  -Please, be careful as this could cause you sedated especially with clonazepam.  -Try icy hot or ice compresses  -Maintain good hydration -Return to clinic if your symptoms (pain) get worse   -If you have any questions or concerns before then, please call the clinic at [phone].  -Sign up for My Chart to have easy access to your labs results, and communication with your Primary care physician.    Please check-out at the front desk before leaving the clinic.   Take Care,     Leg Cramps Leg cramps that occur during exercise can be caused by poor circulation or dehydration. However, muscle cramps that occur at rest or during the night are usually not due to any serious medical problem. Heat cramps may cause muscle spasms during hot weather.  CAUSES There is no clear cause for muscle cramps. However, dehydration may be a factor for those who do not drink enough fluids and those who exercise in the heat. Imbalances in the level of sodium, potassium, calcium or magnesium in the muscle tissue may also be a factor. Some medications, such as water pills (diuretics), may cause loss of chemicals that the body needs (like sodium and potassium) and cause muscle cramps. TREATMENT   Make sure your diet has enough fluids and essential minerals for the muscle to work normally.  Avoid strenuous exercise for several days if you have been having frequent leg cramps.  Stretch and massage the cramped muscle for several minutes.  Some medicines may be helpful in some patients with night cramps. Only take over-the-counter or prescription medicines as directed by your caregiver. SEEK IMMEDIATE MEDICAL CARE IF:   Your leg cramps become worse.  Your foot becomes cold, numb, or blue. Document Released:  07/20/2004 Document Revised: 09/04/2011 Document Reviewed: 07/07/2008 [facility] Patient Information 2015 [place], [place]. This information is not intended to replace advice given to you by your health care provider. Make sure you discuss any questions you have with your health care provider.

## 2015-03-18 NOTE — Progress Notes (Signed)
Patient states he originally went to urgent care but our office wouldn't authorize his visit so he had to come here. [facility] because it close to his house and he relies of public transportation. Patient upset that visit wasn't authorized. Patient given a bus pass and [facility] gift card and our apology for his inconvenience .

## 2015-03-18 NOTE — Assessment & Plan Note (Addendum)
Chronic. Pain at baseline. Less likely to be DVT or cellulitis. No sure if he can benefit from imaging as he reports having an MRI. Right foot X-ray about a year ago with no acute fracture, normal joint spaces or no heel spurring. Could benefit from muscle relaxer but concerned about sedation with concurrent benzo use. Recommended trial of scheduled tylenol for few days which he refuses saying it is not good for his back. He couldn't be specific. -Gave Rx for tramadol 20 tablets.  -Cautioned on side effects especially with concurrent benzo use. -Recommend icing and continuing exercise regimens he was given by ortho. -RTC if worsening symptoms -Gave reading resources for leg cramp

## 2015-03-18 NOTE — Progress Notes (Signed)
   Subjective:    Patient ID: Kevin Hall, male    DOB: 16-Apr-1971, 44 y.o.   MRN: [id number]  HPI "Right achilles tendinitis": cramping pain in his calf. Pain is 6/10 now. At 8/10 at its worst. He has has this for two years. Nothing has acutely changed. Patient has hx of achilles tendon disorder ans was seen in April 2016. He was given referral to ortho. Reports that he went to [facility] in June. Says he had MRI that showed scarred tissue at that time. He was recommended PT which his insurance don't cover. He has been doing home exercise as recommended by ortho.  Took ibuprofen yesterday which helped a little bit. Takes Aspirin 325 mg daily "to prevent clot" as told by orthopedics. Never had clot before. Flexeril and methocarbamol helped in the past. He last used them early this year.   Denies fever, swelling, SOB or chest pain.  Review of Systems Per HPI    Objective:   Physical Exam RLE: appears the same size as left. Some erythema over his achilles likely from his shoe rubbing against his skin. Doesn't look cellulitic. No swelling. Does't feel warm. No concerning sign of DVT. Walks with some limping (not new). No focal tenderness.      Assessment & Plan:  Achilles tendon disorder Chronic. Pain at baseline. Less likely to be DVT or cellulitis. No sure if he can benefit from imaging as he reports having an MRI. Right foot X-ray about a year ago with no acute fracture, normal joint spaces or no heel spurring. Could benefit from muscle relaxer but concerned about sedation with concurrent benzo use. Recommended trial of scheduled tylenol for few days which he refuses saying it is not good for his back. He couldn't be specific. -Gave Rx for tramadol 20 tablets.  -Cautioned on side effects especially with concurrent benzo use. -Recommend icing and continuing exercise regimens he was given by ortho. -RTC if worsening symptoms -Gave reading resources for leg cramp

## 2015-04-05 ENCOUNTER — Other Ambulatory Visit: Payer: Self-pay | Admitting: Family Medicine

## 2015-04-21 ENCOUNTER — Ambulatory Visit (INDEPENDENT_AMBULATORY_CARE_PROVIDER_SITE_OTHER): Payer: Medicaid Other | Admitting: Psychiatry

## 2015-04-21 DIAGNOSIS — F4323 Adjustment disorder with mixed anxiety and depressed mood: Secondary | ICD-10-CM | POA: Diagnosis not present

## 2015-04-22 NOTE — Progress Notes (Signed)
   THERAPIST PROGRESS NOTE  Session Time: 3:35-4:25   Participation Level: Active   Behavioral Response: Well GroomedAlertAnxious   Type of Therapy: Individual Therapy   Treatment Goals addressed: Anxiety and Coping   Interventions: Solution Focused, Strength-based, Supportive and Reframing   Summary: Pt. Continues to present as talkative, anxious, mildly irritable. Pt. Continues to report ongoing legal problems related to disability status. Pt. Continues to manage chronic leg and ankle pain. Pt. Reports that he is able to walk and take care of his son, but has learned to "live with the pain" , but is hopeful that he can get surgery. Pt. Reported that one surgeon will perform the surgery if he is able to stop smoking. Smoking cessation program was discussed, but Pt. Did not indicate interest in smoking stating that he smokes very infrequently and doe not inhale. Pt. Discussed having a birthday and his mother bought him a guitar which was significant because he had activity of having it restrung and hopeful about being able to play in the community. Pt. Reported that he was going to visit a musician friend in the hospital which was significant because he is moving through anxiety and becoming more social.  Suicidal/Homicidal: Nowithout intent/plan   Therapist Response: Assessed patients current functioning and reviewed progress. Reviewed coping strategies. Assessed patients safety and assisted in identifying protective factors. Assisted patient with the expression of anxiety and frustration toward legal system.   Plan: Pt. To continue with person-centered and strength based therapy. Return again in 4-6 weeks.   Diagnosis: Axis I: Adjustment Disorder with Mixed Emotional Features   Axis II: No diagnosis    [name], Kevin Dorko B, [facility] 04/22/2015

## 2015-05-12 ENCOUNTER — Ambulatory Visit (HOSPITAL_COMMUNITY): Payer: Self-pay | Admitting: Psychiatry

## 2015-05-17 ENCOUNTER — Ambulatory Visit (INDEPENDENT_AMBULATORY_CARE_PROVIDER_SITE_OTHER): Payer: Medicaid Other | Admitting: Psychiatry

## 2015-05-17 DIAGNOSIS — F4323 Adjustment disorder with mixed anxiety and depressed mood: Secondary | ICD-10-CM | POA: Diagnosis not present

## 2015-05-18 NOTE — Progress Notes (Signed)
   THERAPIST PROGRESS NOTE  Session Time: 3:10-4:00   Participation Level: Active   Behavioral Response: Well GroomedAlertAnxious   Type of Therapy: Individual Therapy   Treatment Goals addressed: Anxiety and Coping   Interventions: Solution Focused, Strength-based, Supportive and Reframing   Summary: Pt. Continues to present as talkative, anxious, and mildly irritable. Pt. Focused on previous relationships, especially feelings of distrust for his ex-wife and fears that she will take their son. Pt. Reports that her last contact was several years ago, but Pt. Continues to be preoccupied with his fear of her. Pt. Reported that he visited his sister over the weekend and hurt his shoulder while throwing a football with his nephew. Pt. Expressed some disappointment that the family would not be celebrating Thanksgiving together, but was enthusiastic about spending with his son, mother and his brother. Pt. Reported that he is playing his guitar and planning to get new strings for it. Pt. Continues to express frustration toward legal system for not having a speedier response regarding his disability claim. Pt. Continues to complain of pain in his ankle and loss of mobility due to the pain.  Suicidal/Homicidal: Nowithout intent/plan   Therapist Response: Assessed patients current functioning and reviewed progress. Reviewed coping strategies. Assessed patients safety and assisted in identifying protective factors. Assisted patient with the expression of anxiety and frustration toward legal system.  Plan: Pt. To continue with person-centered and strength based therapy. Return again in 4-6 weeks.   Diagnosis: Axis I: Adjustment Disorder with Mixed Emotional Features   Axis II: No diagnosis   [name], Jennifer B, [facility] 05/18/2015

## 2015-06-15 ENCOUNTER — Telehealth: Payer: Self-pay | Admitting: *Deleted

## 2015-06-15 NOTE — Telephone Encounter (Addendum)
[employer] [place] prior authorization for voltaren 1%Gel started, Ticket #[id number], reference # for call [id number] - will fax status of the pre-cert.  Jessica at [employer] states the Voltaren 1% gel is a preferred medication.  [facility]

## 2015-07-07 ENCOUNTER — Ambulatory Visit (HOSPITAL_COMMUNITY): Payer: Self-pay | Admitting: Psychiatry

## 2015-07-21 ENCOUNTER — Ambulatory Visit (HOSPITAL_COMMUNITY): Payer: Self-pay | Admitting: Psychiatry

## 2015-08-02 ENCOUNTER — Ambulatory Visit (INDEPENDENT_AMBULATORY_CARE_PROVIDER_SITE_OTHER): Payer: Medicaid Other | Admitting: Psychiatry

## 2015-08-02 DIAGNOSIS — F4323 Adjustment disorder with mixed anxiety and depressed mood: Secondary | ICD-10-CM

## 2015-08-02 NOTE — Progress Notes (Signed)
   THERAPIST PROGRESS NOTE  Session Time: 3:00-3:30   Participation Level: Active   Behavioral Response: Well GroomedAlertAnxious   Type of Therapy: Individual Therapy   Treatment Goals addressed: Anxiety and Coping   Interventions: Solution Focused, Strength-based, Supportive and Reframing   Summary: Pt. Requested 30 minute session because his brother and son were waiting on him. Pt. continues to present as talkative, anxious, and mildly irritable. This was Pt.'s first session since before Thanksgiving. Pt. Reports that he had a good holiday season and that he bought a sled to play with his son in the snow. Pt. Continues to focus on his anger toward his attorney and social security for denial of disability. Pt. Discussed that he is in significant knee and ankle pain, but despite his pain has pushed himself to walk more and play with his son. Pt. Discussed that he has arranged with his family to have Monday evenings to play his guitar and hang out with his friends. Counselor identified this as part of his self-care and encouraged that he continue to take this time regularly for stress management. Pt. Discussed recent challenge of being single parent to four-year old boy and issues related to discipline. Counselor provided education about providing discipline with positive reinforcement of desired behavior.  Suicidal/Homicidal: Nowithout intent/plan   Therapist Response: Assessed patients current functioning and reviewed progress. Reviewed coping strategies. Assessed patients safety and assisted in identifying protective factors. Assisted patient with the expression of anxiety and frustration toward legal system.  Plan: Pt. To continue with person-centered and strength based therapy. Return again in 4-6 weeks.   Diagnosis: Axis I: Adjustment Disorder with Mixed Emotional Features    [name], [facility] 08/02/2015

## 2015-08-04 ENCOUNTER — Ambulatory Visit (HOSPITAL_COMMUNITY): Payer: Self-pay | Admitting: Psychiatry

## 2015-08-13 ENCOUNTER — Encounter (HOSPITAL_COMMUNITY): Payer: Self-pay | Admitting: Psychiatry

## 2015-08-13 ENCOUNTER — Ambulatory Visit (INDEPENDENT_AMBULATORY_CARE_PROVIDER_SITE_OTHER): Payer: Medicaid Other | Admitting: Psychiatry

## 2015-08-13 VITALS — BP 119/79 | HR 93 | Ht 69.0 in | Wt 188.8 lb

## 2015-08-13 DIAGNOSIS — F4323 Adjustment disorder with mixed anxiety and depressed mood: Secondary | ICD-10-CM

## 2015-08-13 MED ORDER — CLONAZEPAM 0.5 MG PO TABS: 0.5000 mg | ORAL_TABLET | Freq: Three times a day (TID) | ORAL | Status: DC | PRN

## 2015-08-13 NOTE — Progress Notes (Signed)
[facility] MD Progress Note  08/13/2015 11:27 AM Kevin Hall  MRN:  [id number] Subjective:  No real change doing okay Principal Problem: Adjustment disorder with anxious mood state Diagnosis: Adjustment disorder with anxious mood state Today the patient is here and brought his son and his brother. They're in the waiting room. The patient is doing okay. He supported very much by his mother gets up every morning and takes care of them. His son who apparently has some significant visual problems in his right eye is seeing an ophthalmologist. The patient is signing up his son for kindergarten in the next year. Patient takes his Klonopin 0.5 mg 3 times a day with a regular basis. The patient stays active. He plays a guitar one time a week gets together with the group. Patient says he is not depressed. His anxiety is fair. He still has levels of anger at the situation. Essentially is unable to have his right leg dealt with it medical way and is resulting cannot go back into the workforce. 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 He would like physical therapy for his leg but simply not affordable. Patient is sleeping and eating well. He's got good energy. He is no evidence of psychosis. He does not drink any alcohol of significance and uses no drugs. He shows no evidence of psychosis. Changing factor will be as a son starts into school frees up the patient to start himself figure out what else he can do with his life and does not involve a physical job. The patient is in therapy and I suspect this is a topic that they discuss. At this time his anxiety is well controlled taking a small dose of Klonopin. Patient is very reliable does not take extra Klonopin and uses no drugs or alcohol.  Patient Active Problem List   Diagnosis Date Noted  . 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 Achilles tendon disorder  [M67.979] 10/14/2014  . Low back pain [M54.5] 01/09/2014  . HLD (hyperlipidemia) [E78.5] 01/09/2014  . Health care maintenance [Z00.00] 12/29/2013  . Tobacco abuse [Z72.0] 12/29/2013  . Sciatica [M54.30] 12/29/2013  . Adjustment disorder with mixed anxiety and depressed mood [F43.23] 11/06/2012   Total Time spent with patient: 15 minutes  Past Psychiatric History:   Past Medical History:  Past Medical History  Diagnosis Date  . Anxiety   . Depression   . Shoulder dislocation   . Achilles tendinitis     Past Surgical History  Procedure Laterality Date  . Knee arthroscopy    . Hernia repair    . Tonsillectomy and adenoidectomy     Family History:  Family History  Problem Relation Age of Onset  . Anxiety disorder Mother   . Depression Mother   . Diabetes Mother   . Hypertension Mother   . Thyroid disease Mother   . Cancer - Lung Father   . Alcohol abuse Father   . Cancer Father   . Bipolar disorder Brother   . Anxiety disorder Sister    Family Psychiatric  History:  Social History:  History  Alcohol Use No    Comment: a few beers every month      History  Drug Use No    Comment: Drug use twenty years ago     Social History   Social History  . Marital Status: Divorced    Spouse  Name: N/A  . Number of Children: N/A  . Years of Education: N/A   Social History Main Topics  . Smoking status: Current Every Day Smoker -- 0.50 packs/day    Types: Cigarettes  . Smokeless tobacco: Never Used  . Alcohol Use: No     Comment: a few beers every month   . Drug Use: No     Comment: Drug use twenty years ago   . Sexual Activity: Not Currently   Other Topics Concern  . None   Social History Narrative   Additional Social History:                         Sleep: Good  Appetite:  Good  Current Medications: Current Outpatient Prescriptions  Medication Sig Dispense Refill  . acetaminophen-codeine (TYLENOL #3) 300-30 MG per tablet Take 1 tablet by  mouth every 6 (six) hours as needed for moderate pain. (Patient not taking: Reported on 02/08/2015) 20 tablet 0  . aspirin 325 MG tablet Take 325 mg by mouth daily.    . ciprofloxacin (CIPRO) 500 MG tablet Take 1 tablet (500 mg total) by mouth 2 (two) times daily. (Patient not taking: Reported on 02/08/2015) 14 tablet 0  . clonazePAM (KLONOPIN) 0.5 MG tablet Take 1 tablet (0.5 mg total) by mouth 3 (three) times daily as needed for anxiety. 90 tablet 5  . cyclobenzaprine (FLEXERIL) 10 MG tablet Take 1 tablet (10 mg total) by mouth 3 (three) times daily as needed for muscle spasms. (Patient not taking: Reported on 02/08/2015) 30 tablet 0  . diclofenac sodium (VOLTAREN) 1 % GEL Apply 2 g topically 2 (two) times daily. Rub into affected area of foot 2 to 4 times daily (Patient not taking: Reported on 02/08/2015) 100 g 2  . ibuprofen (ADVIL,MOTRIN) 800 MG tablet Take 1 tablet (800 mg total) by mouth every 8 (eight) hours as needed. Takes  in the morning, and two tablets in the evening (Patient not taking: Reported on 02/08/2015) 30 tablet 0  . meloxicam (MOBIC) 15 MG tablet Take 1 tablet (15 mg total) by mouth daily. (Patient not taking: Reported on 02/08/2015) 30 tablet 0  . methocarbamol (ROBAXIN) 500 MG tablet Take 1 tablet (500 mg total) by mouth 2 (two) times daily. (Patient not taking: Reported on 02/08/2015) 20 tablet 0  . metroNIDAZOLE (FLAGYL) 500 MG tablet Take 1 tablet (500 mg total) by mouth 2 (two) times daily. (Patient not taking: Reported on 02/08/2015) 14 tablet 0  . nitroGLYCERIN (NITRODUR - DOSED IN MG/24 HR) 0.2 mg/hr patch Place 0.2 mg onto the skin daily.    . predniSONE (STERAPRED UNI-PAK 21 TAB) 10 MG (21) TBPK tablet Take 10 mg by mouth daily.    . traMADol (ULTRAM) 50 MG tablet Take 1 tablet (50 mg total) by mouth every 6 (six) hours as needed. (Patient not taking: Reported on 02/08/2015) 15 tablet 0  . traMADol (ULTRAM) 50 MG tablet Take 1 tablet (50 mg total) by mouth every 8 (eight)  hours as needed. 20 tablet 0   No current facility-administered medications for this visit.    Lab Results: No results found for this or any previous visit (from the past 48 hour(s)).  Blood Alcohol level:  No results found for: [facility]  Physical Findings: AIMS:  , ,  ,  ,    CIWA:    COWS:     Musculoskeletal: Strength & Muscle Tone: within normal limits Gait & Station: normal  Patient leans: N/A  Psychiatric Specialty Exam: ROS  Blood pressure 119/79, pulse 93, height  (1.753 m), weight 188 lb 12.8 oz (85.639 kg).Body mass index is 27.87 kg/(m^2).  General Appearance: Casual  Eye Contact::  Fair  Speech:  Clear and Coherent  Volume:  Normal  Mood:  Dysphoric  Affect:  Congruent  Thought Process:  Coherent  Orientation:  Full (Time, Place, and Person)  Thought Content:  WDL  Suicidal Thoughts:  No  Homicidal Thoughts:  No  Memory:  NA  Judgement:  Fair  Insight:  Fair  Psychomotor Activity:  Normal  Concentration:  Fair  Recall:  Good  Fund of Knowledge:Fair  Language: Good   Akathisia:  No  Handed:  Right  AIMS (if indicated):     Assets:  Communication Skills  ADL's:  Intact  Cognition: WNL  Sleep:      Treatment Plan Summary: At this time the patient will continue taking Klonopin 0.5 mg 3 times a day. Continue in one-to-one talking therapy. As the next year occurs we will then start discussing other interventions for treatment other than just taking a low-dose of Klonopin. This patient shows no evidence of abuse. He takes his medicines just as prescribed. His distress level is fairly low. He does describe a feeling of frustration and anger whenever acceptable. Today reviewed the pros and cons of his medications and he agreed to take them as prescribed. Is no chest pain or shortness of breath no abdominal discomfort just right leg pain.  Lucas Mallow, MD 08/13/2015, 11:27 AM

## 2015-08-16 ENCOUNTER — Ambulatory Visit (INDEPENDENT_AMBULATORY_CARE_PROVIDER_SITE_OTHER): Payer: Medicaid Other | Admitting: Psychiatry

## 2015-08-16 DIAGNOSIS — F4323 Adjustment disorder with mixed anxiety and depressed mood: Secondary | ICD-10-CM

## 2015-08-19 NOTE — Progress Notes (Signed)
   THERAPIST PROGRESS NOTE  Session Time: 2:05-3:00   Participation Level: Active   Behavioral Response: Well GroomedAlertAnxious   Type of Therapy: Individual Therapy   Treatment Goals addressed: Anxiety and Coping   Interventions: Solution Focused, Strength-based, Supportive and Reframing   Summary: Pt. Presented with mildly brightened affect since last session. Pt. Discussed that he does not like the warm weather, prefers for "it to feel like winter when its winter". Pt reported that he is feeling bored with Monday evening jam session, it is difficult for him to sit around and talk when he wants to play. Pt. Reports that his son is doing well and he has some anxiety about getting him enrolled and adjusted in school in the fall. Pt. Was encouraged to go to the school and talk to the administrators about his enrollment concerns. Pt. Discussed his child's mother and ongoing fears about his child being taken from him. Significant time in session was spent discussing that this has not happened in the past four years of the child's life and was very unlikely to happen in the future. Pt. Was able to discuss internal and external resources that he could use to protect his child in the event he was threatened i.e., calling the police for help, calling DSS, filing for an emergency custody order, alerting school staff that mother is not the custodial parent.  Suicidal/Homicidal: Nowithout intent/plan   Therapist Response: Assessed patients current functioning and reviewed progress. Reviewed coping strategies. Assessed patients safety and assisted in identifying protective factors. Assisted patient with the expression of anxiety and frustration toward legal system.  Plan: Pt. To continue with person-centered and strength based therapy. Return again in 4-6 weeks.   Diagnosis: Axis I: Adjustment Disorder with Mixed Emotional Features    [name], [facility] 08/19/2015

## 2015-09-01 ENCOUNTER — Ambulatory Visit (HOSPITAL_COMMUNITY): Payer: Self-pay | Admitting: Psychiatry

## 2015-09-17 ENCOUNTER — Encounter (HOSPITAL_COMMUNITY): Payer: Self-pay | Admitting: Psychiatry

## 2015-09-17 ENCOUNTER — Ambulatory Visit (HOSPITAL_COMMUNITY): Payer: Self-pay | Admitting: Psychiatry

## 2015-09-17 DIAGNOSIS — F4323 Adjustment disorder with mixed anxiety and depressed mood: Secondary | ICD-10-CM

## 2015-09-23 ENCOUNTER — Ambulatory Visit (HOSPITAL_COMMUNITY): Payer: Self-pay | Admitting: Psychiatry

## 2015-09-30 ENCOUNTER — Ambulatory Visit (HOSPITAL_COMMUNITY): Payer: Self-pay | Admitting: Psychiatry

## 2015-10-14 ENCOUNTER — Ambulatory Visit (INDEPENDENT_AMBULATORY_CARE_PROVIDER_SITE_OTHER): Payer: Medicaid Other | Admitting: Psychiatry

## 2015-10-14 DIAGNOSIS — F4323 Adjustment disorder with mixed anxiety and depressed mood: Secondary | ICD-10-CM | POA: Diagnosis not present

## 2015-10-15 NOTE — Progress Notes (Signed)
   THERAPIST PROGRESS NOTE  Session Time: 3:05-4:00   Participation Level: Active   Behavioral Response: Well GroomedAlertAnxious   Type of Therapy: Individual Therapy   Treatment Goals addressed: Anxiety and Coping   Interventions: Solution Focused, Strength-based, Supportive and Reframing   Summary: Pt. Continues to present with primarily dysphoric mood. Pt. Continues to report knee, ankle and also new back pain since our last session. Pt. Continues to be focused by ongoing problems with disability law attorney and fears about refiling. Pt. Discussed that he is no longer riding the bus because of his pain and depends on his brother for transportation. Pt. Discussed his fear/sadness for not being able to do more activities with his son because of his pain. Therapist recommended pain management clinic, but Pt. Is not open to prescription pain management due to fears of dependence, lethargy and not being fully present for his son. Pt. Continues to meet with his friends regularly to play music and considers this his most significant source of leisure and stress management. Pt. Reports that he is sleeping and eating well, but severely limited in physical activity due to his pain.  Suicidal/Homicidal: Nowithout intent/plan   Therapist Response: Assessed patients current functioning and reviewed progress. Reviewed coping strategies. Assessed patients safety and assisted in identifying protective factors. Assisted patient with the expression of anxiety and frustration toward legal system.  Plan: Pt. To continue with person-centered and strength based therapy. Return again in 4-6 weeks.   Diagnosis: Axis I: Adjustment Disorder with Mixed Emotional Features    [name], Jennifer B, [facility] 10/15/2015

## 2015-10-26 ENCOUNTER — Encounter (HOSPITAL_COMMUNITY): Payer: Self-pay | Admitting: Emergency Medicine

## 2015-10-26 ENCOUNTER — Emergency Department (HOSPITAL_COMMUNITY)
Admission: EM | Admit: 2015-10-26 | Discharge: 2015-10-26 | Disposition: A | Discharge status: Home / Self Care | Payer: Medicaid Other | Attending: Emergency Medicine | Admitting: Emergency Medicine

## 2015-10-26 ENCOUNTER — Emergency Department (HOSPITAL_COMMUNITY): Payer: Medicaid Other

## 2015-10-26 DIAGNOSIS — Y9289 Other specified places as the place of occurrence of the external cause: Secondary | ICD-10-CM | POA: Diagnosis not present

## 2015-10-26 DIAGNOSIS — F17210 Nicotine dependence, cigarettes, uncomplicated: Secondary | ICD-10-CM | POA: Diagnosis not present

## 2015-10-26 DIAGNOSIS — Y998 Other external cause status: Secondary | ICD-10-CM | POA: Diagnosis not present

## 2015-10-26 DIAGNOSIS — S4991XA Unspecified injury of right shoulder and upper arm, initial encounter: Secondary | ICD-10-CM | POA: Diagnosis present

## 2015-10-26 DIAGNOSIS — S46911A Strain of unspecified muscle, fascia and tendon at shoulder and upper arm level, right arm, initial encounter: Secondary | ICD-10-CM | POA: Insufficient documentation

## 2015-10-26 DIAGNOSIS — X58XXXA Exposure to other specified factors, initial encounter: Secondary | ICD-10-CM | POA: Insufficient documentation

## 2015-10-26 DIAGNOSIS — Z7982 Long term (current) use of aspirin: Secondary | ICD-10-CM | POA: Diagnosis not present

## 2015-10-26 DIAGNOSIS — Z8739 Personal history of other diseases of the musculoskeletal system and connective tissue: Secondary | ICD-10-CM | POA: Diagnosis not present

## 2015-10-26 DIAGNOSIS — Z79899 Other long term (current) drug therapy: Secondary | ICD-10-CM | POA: Insufficient documentation

## 2015-10-26 DIAGNOSIS — Y9389 Activity, other specified: Secondary | ICD-10-CM | POA: Diagnosis not present

## 2015-10-26 DIAGNOSIS — F329 Major depressive disorder, single episode, unspecified: Secondary | ICD-10-CM | POA: Diagnosis not present

## 2015-10-26 DIAGNOSIS — F419 Anxiety disorder, unspecified: Secondary | ICD-10-CM | POA: Insufficient documentation

## 2015-10-26 HISTORY — DX: Dorsalgia, unspecified: M54.9

## 2015-10-26 MED ORDER — DEXAMETHASONE SODIUM PHOSPHATE 10 MG/ML IJ SOLN
10.0000 mg | Freq: Once | INTRAMUSCULAR | Status: AC
  Administered 2015-10-26: 10 mg via INTRAMUSCULAR
  Filled 2015-10-26: qty 1

## 2015-10-26 MED ORDER — PREDNISONE 10 MG (21) PO TBPK: 10.0000 mg | ORAL_TABLET | Freq: Every day | ORAL | Status: DC

## 2015-10-26 MED ORDER — OXYCODONE-ACETAMINOPHEN 5-325 MG PO TABS
1.0000 | ORAL_TABLET | Freq: Once | ORAL | Status: AC
  Administered 2015-10-26: 1 via ORAL
  Filled 2015-10-26: qty 1

## 2015-10-26 MED ORDER — DIAZEPAM 5 MG PO TABS
5.0000 mg | ORAL_TABLET | Freq: Once | ORAL | Status: AC
  Administered 2015-10-26: 5 mg via ORAL
  Filled 2015-10-26: qty 1

## 2015-10-26 MED ORDER — HYDROCODONE-ACETAMINOPHEN 5-325 MG PO TABS: 1.0000 | ORAL_TABLET | ORAL | Status: DC | PRN

## 2015-10-26 NOTE — ED Provider Notes (Signed)
CSN: [id number]     Arrival date & time 10/26/15  0932 History   First MD Initiated Contact with Patient 10/26/15 0957     Chief Complaint  Patient presents with  . 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  THE PT FEELS LIKE HE'S BEEN USING IT TOO MUCH.  PT NOW HAS PAIN RADIATING INTO HIS NECK.  HE DOES NOT FEEL LIKE HIS SHOULDER IS DISLOCATED NOW.  (Consider location/radiation/quality/duration/timing/severity/associated sxs/prior Treatment) Patient is a 45 y.o. male presenting with shoulder pain. The history is provided by the patient.  Shoulder Pain Location:  Shoulder Shoulder location:  R shoulder Pain details:    Quality:  Aching   Radiates to:  Back   Severity:  Moderate   Onset quality:  Gradual   Timing:  Constant   Progression:  Unchanged Chronicity:  Recurrent Handedness:  Right-handed Dislocation: no   Prior injury to area:  Yes   Past Medical History  Diagnosis Date  . Anxiety   . Depression   . Shoulder dislocation   . Achilles tendinitis   . Back pain    Past Surgical History  Procedure Laterality Date  . Knee arthroscopy    . Hernia repair    . Tonsillectomy and adenoidectomy     Family History  Problem Relation Age of Onset  . Anxiety disorder Mother   . Depression Mother   . Diabetes Mother   . Hypertension Mother   . Thyroid disease Mother   . Cancer - Lung Father   . Alcohol abuse Father   . Cancer Father   . Bipolar disorder Brother   . Anxiety disorder Sister    Social History  Substance Use Topics  . Smoking status: Current Every Day Smoker -- 0.50 packs/day    Types: Cigarettes  . Smokeless tobacco: Never Used  . Alcohol Use: No     Comment: a few beers every month     Review of Systems  Musculoskeletal:       RIGHT SHOULDER PAIN  All other systems reviewed and are negative.     Allergies  Review of patient's allergies indicates no known allergies.  Home  Medications   Prior to Admission medications   Medication Sig Start Date End Date Taking? Authorizing Provider  amoxicillin-clavulanate (AUGMENTIN) 875-125 MG tablet Take 1 tablet by mouth 2 (two) times daily.   Yes Historical Provider, MD  clonazePAM (KLONOPIN) 0.5 MG tablet Take 1 tablet (0.5 mg total) by mouth 3 (three) times daily as needed for anxiety. 08/13/15 08/12/16 Yes Archer Asa, MD  diphenhydrAMINE (BENADRYL) 25 mg capsule Take 25 mg by mouth every 6 (six) hours as needed for allergies.   Yes Historical Provider, MD  acetaminophen-codeine (TYLENOL #3) 300-30 MG per tablet Take 1 tablet by mouth every 6 (six) hours as needed for moderate pain. Patient not taking: Reported on 02/08/2015 07/03/14   Vivi Barrack, DPM  aspirin 325 MG tablet Take 325 mg by mouth daily.    Historical Provider, MD  ciprofloxacin (CIPRO) 500 MG tablet Take 1 tablet (500 mg total) by mouth 2 (two) times daily. Patient not taking: Reported on 02/08/2015 11/14/14   Harle Battiest, NP  cyclobenzaprine (FLEXERIL) 10 MG tablet Take 1 tablet (10 mg total) by mouth 3 (three) times daily as needed for muscle spasms. Patient not taking: Reported on 02/08/2015 02/18/14   Ivonne Andrew, PA-C  diclofenac sodium (VOLTAREN)  1 % GEL Apply 2 g topically 2 (two) times daily. Rub into affected area of foot 2 to 4 times daily Patient not taking: Reported on 02/08/2015 07/03/14   Vivi Barrack, DPM  HYDROcodone-acetaminophen (NORCO/VICODIN) 5-325 MG tablet Take 1 tablet by mouth every 4 (four) hours as needed. 10/26/15   Jacalyn Lefevre, MD  ibuprofen (ADVIL,MOTRIN) 800 MG tablet Take 1 tablet (800 mg total) by mouth every 8 (eight) hours as needed. Takes  in the morning, and two tablets in the evening Patient not taking: Reported on 02/08/2015 05/15/14   Vivi Barrack, DPM  meloxicam (MOBIC) 15 MG tablet Take 1 tablet (15 mg total) by mouth daily. Patient not taking: Reported on 02/08/2015 01/04/15   Dahlia Client Muthersbaugh,  PA-C  methocarbamol (ROBAXIN) 500 MG tablet Take 1 tablet (500 mg total) by mouth 2 (two) times daily. Patient not taking: Reported on 02/08/2015 01/04/15   Dahlia Client Muthersbaugh, PA-C  metroNIDAZOLE (FLAGYL) 500 MG tablet Take 1 tablet (500 mg total) by mouth 2 (two) times daily. Patient not taking: Reported on 02/08/2015 11/14/14   Harle Battiest, NP  nitroGLYCERIN (NITRODUR - DOSED IN MG/24 HR) 0.2 mg/hr patch Place 0.2 mg onto the skin daily.    Historical Provider, MD  predniSONE (STERAPRED UNI-PAK 21 TAB) 10 MG (21) TBPK tablet Take 1 tablet (10 mg total) by mouth daily. Take 6 tabs by mouth daily  for 2 days, then 5 tabs for 2 days, then 4 tabs for 2 days, then 3 tabs for 2 days, 2 tabs for 2 days, then 1 tab by mouth daily for 2 days 10/26/15   Jacalyn Lefevre, MD  traMADol (ULTRAM) 50 MG tablet Take 1 tablet (50 mg total) by mouth every 6 (six) hours as needed. Patient not taking: Reported on 02/08/2015 11/14/14   Harle Battiest, NP  traMADol (ULTRAM) 50 MG tablet Take 1 tablet (50 mg total) by mouth every 8 (eight) hours as needed. Patient not taking: Reported on 10/26/2015 03/18/15   Almon Hercules, MD   BP 140/72 mmHg  Pulse 78  Temp(Src) 97.4 F (36.3 C) (Oral)  Resp 18  Ht  (1.753 m)  Wt 180 lb (81.647 kg)  BMI 26.57 kg/m2  SpO2 100% Physical Exam  Constitutional: He is oriented to person, place, and time. He appears well-developed and well-nourished.  HENT:  Head: Normocephalic and atraumatic.  Right Ear: External ear normal.  Left Ear: External ear normal.  Mouth/Throat: Oropharynx is clear and moist.  Eyes: Conjunctivae and EOM are normal. Pupils are equal, round, and reactive to light.  Neck: Normal range of motion. Neck supple.  Cardiovascular: Normal rate, regular rhythm, normal heart sounds and intact distal pulses.   Pulmonary/Chest: Effort normal and breath sounds normal.  Abdominal: Soft. Bowel sounds are normal.  Musculoskeletal: Normal range of motion.        Right shoulder: He exhibits tenderness.       Arms: Neurological: He is alert and oriented to person, place, and time.  Skin: Skin is warm and dry.  Psychiatric: He has a normal mood and affect. His behavior is normal.  Nursing note and vitals reviewed.   ED Course  Procedures (including critical care time) Labs Review Labs Reviewed - No data to display  Imaging Review Dg Chest 2 View  10/26/2015  CLINICAL DATA:  Right shoulder pain, hx of dislocation in past, has hx of lower back pain.No inj,chronic pain for yrs,,rt neck very stiff EXAM: CHEST  2 VIEW COMPARISON:  Chest CT, 07/05/2003 FINDINGS: Normal heart, mediastinum and hila. Clear lungs.  No pleural effusion or pneumothorax. Mild curvature of the thoracic spine, convex to the right. Bony thorax otherwise unremarkable. IMPRESSION: No active cardiopulmonary disease. Electronically Signed   By: Amie Portlandavid  Ormond M.D.   On: 10/26/2015 11:14   Dg Cervical Spine Complete  10/26/2015  CLINICAL DATA:  Right shoulder pain, hx of dislocation in past, has hx of lower back pain.No inj,chronic pain for yrs,,rt neck very stiff EXAM: CERVICAL SPINE - COMPLETE 4+ VIEW COMPARISON:  None. FINDINGS: No fracture.  No spondylolisthesis. There is developmental fusion between C2 and C3. Mild loss disc height at C5-C6 with moderate loss disc height at C6-C7. There are small endplate osteophytes at these levels. Neural foramina are relatively well preserved. Soft tissues are unremarkable. IMPRESSION: 1. No fracture or acute finding. 2. 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 Electronically Signed   By: Amie Portlandavid  Ormond M.D.   On: 10/26/2015 11:17   Dg Shoulder Right  10/26/2015  CLINICAL DATA:  Right shoulder pain, hx of dislocation in past, has hx of lower back pain.No inj,chronic pain for yrs,,rt neck very stiff EXAM: RIGHT SHOULDER - 2+ VIEW COMPARISON:  02/28/2012 FINDINGS: There is no evidence of fracture or  dislocation. There is no evidence of arthropathy or other focal bone abnormality. Soft tissues are unremarkable. IMPRESSION: Negative. Electronically Signed   By: Amie Portlandavid  Ormond M.D.   On: 10/26/2015 11:18   I have personally reviewed and evaluated these images and lab results as part of my medical decision-making.   EKG Interpretation None      MDM   Final diagnoses:  Right shoulder strain, initial encounter       Jacalyn LefevreJulie Nazim Kadlec, MD 10/26/15 1137

## 2015-10-26 NOTE — Discharge Instructions (Signed)
Muscle Strain °A muscle strain (pulled muscle) happens when a muscle is stretched beyond normal length. It happens when a sudden, violent force stretches your muscle too far. Usually, a few of the fibers in your muscle are torn. Muscle strain is common in athletes. Recovery usually takes 1-2 weeks. Complete healing takes 5-6 weeks.  °HOME CARE  °· Follow the PRICE method of treatment to help your injury get better. Do this the first 2-3 days after the injury: °¨ Protect. Protect the muscle to keep it from getting injured again. °¨ Rest. Limit your activity and rest the injured body part. °¨ Ice. Put ice in a plastic bag. Place a towel between your skin and the bag. Then, apply the ice and leave it on from 15-20 minutes each hour. After the third day, switch to moist heat packs. °¨ Compression. Use a splint or elastic bandage on the injured area for comfort. Do not put it on too tightly. °¨ Elevate. Keep the injured body part above the level of your heart. °· Only take medicine as told by your doctor. °· Warm up before doing exercise to prevent future muscle strains. °GET HELP IF:  °· You have more pain or puffiness (swelling) in the injured area. °· You feel numbness, tingling, or notice a loss of strength in the injured area. °MAKE SURE YOU:  °· Understand these instructions. °· Will watch your condition. °· Will get help right away if you are not doing well or get worse. °  °This information is not intended to replace advice given to you by your health care provider. Make sure you discuss any questions you have with your health care provider. °  °Document Released: 03/21/2008 Document Revised: 04/02/2013 Document Reviewed: 01/09/2013 °Elsevier Interactive Patient Education ©2016 [employer] ° °

## 2015-10-26 NOTE — ED Notes (Signed)
Right shoulder pain, hx of dislocation in past, has hx of lower back pain. States has been over using right arm.

## 2015-11-02 DIAGNOSIS — M7661 Achilles tendinitis, right leg: Secondary | ICD-10-CM

## 2015-11-04 ENCOUNTER — Ambulatory Visit (INDEPENDENT_AMBULATORY_CARE_PROVIDER_SITE_OTHER): Payer: Medicaid Other | Admitting: Psychiatry

## 2015-11-04 DIAGNOSIS — F4323 Adjustment disorder with mixed anxiety and depressed mood: Secondary | ICD-10-CM | POA: Diagnosis not present

## 2015-11-04 NOTE — Progress Notes (Signed)
   THERAPIST PROGRESS NOTE  Session Time: 2:05-3:00  Participation Level: Active   Behavioral Response: Well GroomedAlertAnxious   Type of Therapy: Individual Therapy   Treatment Goals addressed: Anxiety and Coping   Interventions: Solution Focused, Strength-based, Supportive and Reframing   Summary: Pt. Presented as anxious, restless, agitated, defensive. Pt. Appeared flushed and disheveled. Pt. Attributed his agitation and appearance to being overwhelmed by the things he had scheduled for today, shoulder pain, and being tired.Pt. Reported that he had taken a valium prior to coming to the session for pain and that the valium made him feel agitated. Pt became defensive when Counselor shared observation that his appearance was different than usual. Pt. Reported that he felt positive about seeing Dr. Selena BattenKim for primary care and was not sure that he needed to continue counseling. Pt. Was asked about progress related to his anxiety. Pt. Indicated that his anxiety was situational and related to his feeling overwhelmed by need to go to several doctor's and prolonged disability case. Pt. Discussed that his shoulder pain caused him to feel "vulnerable" and this was uncomfortable for him. Pt. Discussed pattern of expressing anger when he feels vulnerable because vulnerability is uncomfortable for him.  Suicidal/Homicidal: Nowithout intent/plan   Therapist Response: Assessed patients current functioning and reviewed progress. Reviewed coping strategies. Assessed patients safety and assisted in identifying protective factors. Assisted patient with the expression of anxiety and frustration.  Plan: Pt. To continue with person-centered and strength based therapy. Return again in 4-6 weeks.   Diagnosis: Axis I: Adjustment Disorder with Mixed Emotional Features    [name], Soham Hollett B, [facility] 11/04/2015

## 2015-11-08 NOTE — Progress Notes (Signed)
Patient ID: Kevin Hall, Kevin Hall   DOB: [birth year], 45 y.o.   MRN: [id number]  Letter prepared and mailed to patient regarding counseling services provided for long-term disability claim.

## 2015-12-02 ENCOUNTER — Encounter: Payer: Self-pay | Admitting: Physical Therapy

## 2015-12-02 ENCOUNTER — Ambulatory Visit (HOSPITAL_COMMUNITY): Payer: Self-pay | Admitting: Psychiatry

## 2015-12-02 ENCOUNTER — Ambulatory Visit: Payer: Medicaid Other | Attending: Specialist | Admitting: Physical Therapy

## 2015-12-02 DIAGNOSIS — M25511 Pain in right shoulder: Secondary | ICD-10-CM

## 2015-12-02 NOTE — Patient Instructions (Signed)
   Using your affected arm, walk your hand up a wall straight in front of you as high as you are able. Do this exercise in a relatively pain-free range of motion at your shoulder/arm. 10 times 2 times per day.    Hold your arm against your side, with your elbow at a 90 degree angle. Without moving your body, push your fist straight into the wall ahead of you. You should feel your shoulder muscles contract. Repeat contract and relax motion. Hold 5 sec 5 times 1 time per day    Stand to the side with your affected shoulder near a wall. Slide your arm up to the side, teasing the end range of movement. Slowly come back down and repeat. 10 times  2 times per day.    Hold your arm against your side, with your elbow at a 90 degree angle. Without moving your body, push your arm into the wall. You should feel your shoulder muscles contract. Repeat contract and relax motion. Hold 5 sec 5 times.     Hold your arm against your side, with your elbow at a 90 degree angle. Without moving your body, push the back of your hand into the wall. You should feel your shoulder muscles contract. Repeat contract and relax motion. Hold 5 sec 5 times 1 time per day.     Hold your arm against your side, with your elbow at a 90 degree angle. Without actually moving your arm, push your hand into the wall. You should feel your shoulder muscles contract. Repeat contract and relax motion. Hold 5 sec, 5 times 1-2 times per day.     Hold your arm against your side, with your elbow at a 90 degree angle. Without moving your body, push your arm back into the wall. You should feel your shoulder muscles contract. Repeat contract and relax motion. Hold 5 sec 5 times 1-2 times per day.   Stand and push both hands into the table holding for 10 sec. 10 times.   [facility] [address], [address] [place], [place] [address] Phone # [phone] Fax [phone]

## 2015-12-02 NOTE — Therapy (Signed)
[facility] [address] [place], [place], [address] Phone: [phone]   Fax:  [phone]  Physical Therapy Evaluation  Patient Details  Name: Kevin Hall MRN: [id number] Date of Birth: 06-Nov-1970 Referring Provider: Dr. Valma Cava  Encounter Date: 12/02/2015      PT End of Session - 12/02/15 1527    Visit Number 1   Authorization Type Medicaid   PT Start Time 1445   PT Stop Time 1528   PT Time Calculation (min) 43 min   Activity Tolerance Patient limited by pain;Patient tolerated treatment well   Behavior During Therapy Restless      Past Medical History  Diagnosis Date  . Anxiety   . Depression   . Shoulder dislocation   . Achilles tendinitis   . Back pain     Past Surgical History  Procedure Laterality Date  . Knee arthroscopy    . Hernia repair    . Tonsillectomy and adenoidectomy      There were no vitals filed for this visit.       Subjective Assessment - 12/02/15 1453    Subjective I have alot going on.  I am going to do alot swimming this summer. First shoulder dislocation is 1990 and first full dislocation is 46. Patient has had chronic shoulder dislocations.     Patient Stated Goals exercises to strengthen the right shoulder for stability   Currently in Pain? Yes   Pain Location Shoulder   Pain Orientation Right   Pain Descriptors / Indicators Discomfort   Pain Onset Other (comment)   Pain Frequency Intermittent   Aggravating Factors  reaching outwards   Pain Relieving Factors arm at side   Multiple Pain Sites No            [facility] PT Assessment - 12/02/15 0001    Assessment   Medical Diagnosis Disorder of ligament of right shoulder   Referring Provider Dr. Valma Cava   Onset Date/Surgical Date 06/27/15   Hand Dominance Right   Prior Therapy None   Precautions   Precautions None   Restrictions   Weight Bearing Restrictions No   Balance Screen   Has the  patient fallen in the past 6 months No   Has the patient had a decrease in activity level because of a fear of falling?  No   Is the patient reluctant to leave their home because of a fear of falling?  No   Prior Function   Level of Independence Independent   Vocation Unemployed  presently working on disability   Cognition   Overall Cognitive Status Within Functional Limits for tasks assessed   Observation/Other Assessments   Focus on Therapeutic Outcomes (FOTO)  50% limitation   ROM / Strength   AROM / PROM / Strength AROM;Strength   AROM   Overall AROM  Within functional limits for tasks performed   Strength   Overall Strength Comments right shoulder strength is 3+/5, left shoulder strength is 4/5                           PT Education - 12/02/15 1526    Education provided Yes   Education Details shoulder isometrics and wall walking exercises   Person(s) Educated Patient   Methods Explanation;Demonstration;Verbal cues;Handout   Comprehension Returned demonstration;Verbalized understanding             PT Long Term Goals - 12/02/15 1535  PT LONG TERM GOAL #1   Title Patient is able to demonstrate correct shoulder exercises   Time 1   Period Days   Status Achieved               Plan - 12/02/15 1528    Clinical Impression Statement Patient is a 45 year old male with diagnosis of Disorder of ligament of right shoulder.  Patient has had chronic shoulder pain with his shoulder going in and out of the joint.  Patient has full bilateral shoulder ROM .  Patient feels like his right shoulder may go out when he moves right shoulder into abduction and comes down.  Patient has pain in neck and upper back with shoulder exercises that make him stop frequently.  Patient has learned a HEP that will promote stability of right shoulder and increase strength. Patient is only able to come 1 time due to insurance only approving 1 session.    Rehab Potential  Excellent   Clinical Impairments Affecting Rehab Potential Patient has many orthopedic conditions causing pain making it difficulty to perform exercises   PT Frequency 1x / week   PT Duration --  1 time session due to insurance   PT Treatment/Interventions Therapeutic exercise;Patient/family education   PT Next Visit Plan Discharge to HEP   PT Home Exercise Plan current HEP   Recommended Other Services None   Consulted and Agree with Plan of Care Patient      Patient will benefit from skilled therapeutic intervention in order to improve the following deficits and impairments:  Pain, Decreased strength  Visit Diagnosis: Pain in right shoulder - Plan: PT plan of care cert/re-cert     Problem List Patient Active Problem List   Diagnosis Date Noted  . Achilles tendon disorder 10/14/2014  . Low back pain 01/09/2014  . HLD (hyperlipidemia) 01/09/2014  . Health care maintenance 12/29/2013  . Tobacco abuse 12/29/2013  . Sciatica 12/29/2013  . Adjustment disorder with mixed anxiety and depressed mood 11/06/2012  . Attention deficit disorder (ADD), child, with hyperactivity 07/17/2012    Kevin Hall, PT 12/02/2015 3:38 PM   [facility] [address] [place], [place], [address] Phone: [phone]   Fax:  [phone]  Name: Kevin SizerJoshua Hall MRN: [id number] Date of Birth: Jul 20, 1970

## 2015-12-24 ENCOUNTER — Ambulatory Visit (HOSPITAL_COMMUNITY): Payer: Self-pay | Admitting: Psychiatry

## 2016-02-08 ENCOUNTER — Ambulatory Visit (INDEPENDENT_AMBULATORY_CARE_PROVIDER_SITE_OTHER): Payer: Medicaid Other | Admitting: Psychiatry

## 2016-02-08 DIAGNOSIS — F411 Generalized anxiety disorder: Secondary | ICD-10-CM | POA: Insufficient documentation

## 2016-02-08 NOTE — Progress Notes (Signed)
   THERAPIST PROGRESS NOTE  Session Time: 2:05-3:00  Participation Level: Active   Behavioral Response: Well GroomedAlertAnxious   Type of Therapy: Individual Therapy   Treatment Goals addressed: Anxiety and Coping   Interventions: Solution Focused, Strength-based, Supportive and Reframing   Summary: Pt. Continues to present as anxious, restless, agitated. Pt. Discussed that he was very unhappy because his disability application was denied again. Pt. Continues to complain of hip, back, and leg pain but has decided to stop taking pain medication because he reports that it makes him feel "foggy" and difficulty to mentally focus. Pt. Discussed that his son will be beginning kindergarten this school year. Pt. Discussed his anxiety and fears that his son's mother will show up at the school and take him. Pt. Was redirected by the counselor to focus on the here and now and absence of evidence that the child's mother intends to be a part of his life. Pt. Was encouraged to communicate with the school staff and to develop a relationship with his son's bus driver to help manage his anxiety during the day and to continue to focus on aspects of his child's life that he can exercise control and not to anticipate events outside of his control I.e., the behavior and decisions of others or to engage in mind reading about events for which there is no present evidence to support.  Suicidal/Homicidal: Nowithout intent/plan   Therapist Response: Assessed patients current functioning and reviewed progress. Reviewed coping strategies. Assessed patients safety and assisted in identifying protective factors. Assisted patient with the expression of anxiety and frustration.  Plan: Pt. To continue with person-centered and strength based therapy. Return again in 4-6 weeks.   Diagnosis: Axis I: Adjustment Disorder with Mixed Emotional Features   [name], Jennifer B, [facility] 02/08/2016

## 2016-03-02 ENCOUNTER — Encounter (HOSPITAL_COMMUNITY): Payer: Self-pay | Admitting: Psychiatry

## 2016-03-02 ENCOUNTER — Ambulatory Visit (INDEPENDENT_AMBULATORY_CARE_PROVIDER_SITE_OTHER): Payer: Medicaid Other | Admitting: Psychiatry

## 2016-03-02 VITALS — BP 118/68 | HR 84 | Ht 69.0 in | Wt 191.0 lb

## 2016-03-02 DIAGNOSIS — F411 Generalized anxiety disorder: Secondary | ICD-10-CM

## 2016-03-02 DIAGNOSIS — F4323 Adjustment disorder with mixed anxiety and depressed mood: Secondary | ICD-10-CM

## 2016-03-02 MED ORDER — CLONAZEPAM 0.5 MG PO TABS: 0.5000 mg | ORAL_TABLET | Freq: Three times a day (TID) | ORAL | 5 refills | Status: DC | PRN

## 2016-03-02 NOTE — Progress Notes (Signed)
[facility] MD Progress Note  03/02/2016 1:41 PM Kevin Hall  MRN:  [id number] Subjective:  No real change doing okay Principal Problem: Adjustment disorder with anxious mood state Diagnosis: Adjustment disorder with anxious mood state Patient is doing fairly well. Unfortunately has significant pain in his back. Unfortunately has another neurological problem of his spine. I believe he is spinal stenosis. He continues to have problems with his leg. The good news is side is now getting adjusted to kindergarten he's been there 2 weeks. The patient seems very comfortable. Patient did have a lot of pain and was being treated with opiate didn't like the way it made him feel. He took it for a few months and interestingly he himself reduced his Klonopin down to taking just 0.25 mg a day. Patients anxiety is well-controlled. He denies daily depression. He is sleeping and eating very well. She has actually gained a little bit of weight. Patient still plays guitar. Patient is not very stable in many ways dedicated father. Total Time spent with patient: 15 minutes  Past Psychiatric History:   Past Medical History:  Past Medical History:  Diagnosis Date  . Achilles tendinitis   . Anxiety   . Back pain   . Depression   . Shoulder dislocation     Past Surgical History:  Procedure Laterality Date  . HERNIA REPAIR    . KNEE ARTHROSCOPY    . TONSILLECTOMY AND ADENOIDECTOMY     Family History:  Family History  Problem Relation Age of Onset  . Anxiety disorder Mother   . Depression Mother   . Diabetes Mother   . Hypertension Mother   . Thyroid disease Mother   . Cancer - Lung Father   . Alcohol abuse Father   . Cancer Father   . Bipolar disorder Brother   . Anxiety disorder Sister    Family Psychiatric  History:  Social History:  History  Alcohol Use No    Comment: a few beers every month      History  Drug Use No    Comment: Drug use twenty years ago     Social History   Social History  .  Marital status: Divorced    Spouse name: N/A  . Number of children: N/A  . Years of education: N/A   Social History Main Topics  . Smoking status: Current Every Day Smoker    Packs/day: 0.50    Types: Cigarettes  . Smokeless tobacco: Never Used  . Alcohol use No     Comment: a few beers every month   . Drug use: No     Comment: Drug use twenty years ago   . Sexual activity: Not Currently   Other Topics Concern  . None   Social History Narrative  . None   Additional Social History:                         Sleep: Good  Appetite:  Good  Current Medications: Current Outpatient Prescriptions  Medication Sig Dispense Refill  . acetaminophen-codeine (TYLENOL #3) 300-30 MG per tablet Take 1 tablet by mouth every 6 (six) hours as needed for moderate pain. (Patient not taking: Reported on 02/08/2015) 20 tablet 0  . amoxicillin-clavulanate (AUGMENTIN) 875-125 MG tablet Take 1 tablet by mouth 2 (two) times daily.    Marland Kitchen aspirin 325 MG tablet Take 325 mg by mouth daily.    . ciprofloxacin (CIPRO) 500 MG tablet Take 1 tablet (500 mg  total) by mouth 2 (two) times daily. (Patient not taking: Reported on 02/08/2015) 14 tablet 0  . clonazePAM (KLONOPIN) 0.5 MG tablet Take 1 tablet (0.5 mg total) by mouth 3 (three) times daily as needed for anxiety. 30 tablet 5  . cyclobenzaprine (FLEXERIL) 10 MG tablet Take 1 tablet (10 mg total) by mouth 3 (three) times daily as needed for muscle spasms. (Patient not taking: Reported on 02/08/2015) 30 tablet 0  . diclofenac sodium (VOLTAREN) 1 % GEL Apply 2 g topically 2 (two) times daily. Rub into affected area of foot 2 to 4 times daily (Patient not taking: Reported on 02/08/2015) 100 g 2  . diphenhydrAMINE (BENADRYL) 25 mg capsule Take 25 mg by mouth every 6 (six) hours as needed for allergies.    Marland Kitchen HYDROcodone-acetaminophen (NORCO/VICODIN) 5-325 MG tablet Take 1 tablet by mouth every 4 (four) hours as needed. 10 tablet 0  . ibuprofen (ADVIL,MOTRIN)  800 MG tablet Take 1 tablet (800 mg total) by mouth every 8 (eight) hours as needed. Takes 800mg  in the morning, and two tablets in the evening (Patient not taking: Reported on 02/08/2015) 30 tablet 0  . meloxicam (MOBIC) 15 MG tablet Take 1 tablet (15 mg total) by mouth daily. (Patient not taking: Reported on 02/08/2015) 30 tablet 0  . methocarbamol (ROBAXIN) 500 MG tablet Take 1 tablet (500 mg total) by mouth 2 (two) times daily. (Patient not taking: Reported on 02/08/2015) 20 tablet 0  . metroNIDAZOLE (FLAGYL) 500 MG tablet Take 1 tablet (500 mg total) by mouth 2 (two) times daily. (Patient not taking: Reported on 02/08/2015) 14 tablet 0  . nitroGLYCERIN (NITRODUR - DOSED IN MG/24 HR) 0.2 mg/hr patch Place 0.2 mg onto the skin daily.    . predniSONE (STERAPRED UNI-PAK 21 TAB) 10 MG (21) TBPK tablet Take 1 tablet (10 mg total) by mouth daily. Take 6 tabs by mouth daily  for 2 days, then 5 tabs for 2 days, then 4 tabs for 2 days, then 3 tabs for 2 days, 2 tabs for 2 days, then 1 tab by mouth daily for 2 days 42 tablet 0  . traMADol (ULTRAM) 50 MG tablet Take 1 tablet (50 mg total) by mouth every 6 (six) hours as needed. (Patient not taking: Reported on 02/08/2015) 15 tablet 0  . traMADol (ULTRAM) 50 MG tablet Take 1 tablet (50 mg total) by mouth every 8 (eight) hours as needed. (Patient not taking: Reported on 10/26/2015) 20 tablet 0   No current facility-administered medications for this visit.     Lab Results: No results found for this or any previous visit (from the past 48 hour(s)).  Blood Alcohol level:  No results found for: [facility]  Physical Findings: AIMS:  , ,  ,  ,    CIWA:    COWS:     Musculoskeletal: Strength & Muscle Tone: within normal limits Gait & Station: normal Patient leans: N/A  Psychiatric Specialty Exam: ROS  Blood pressure 118/68, pulse 84, height 5\' 9"  (1.753 m), weight 191 lb (86.6 kg).Body mass index is 28.21 kg/m.  General Appearance: Casual  Eye Contact::  Fair   Speech:  Clear and Coherent  Volume:  Normal  Mood:  Dysphoric  Affect:  Congruent  Thought Process:  Coherent  Orientation:  Full (Time, Place, and Person)  Thought Content:  WDL  Suicidal Thoughts:  No  Homicidal Thoughts:  No  Memory:  NA  Judgement:  Fair  Insight:  Fair  Psychomotor Activity:  Normal  Concentration:  Fair  Recall:  Dudley MajorGood  Fund of Knowledge:Fair  Language: Good   Akathisia:  No  Handed:  Right  AIMS (if indicated):     Assets:  Communication Skills  ADL's:  Intact  Cognition: WNL  Sleep:      Treatment Plan Summary: 03/02/2016, 1:41 PM At this time this patient's anxiety is well-controlled. He has adjustment disorder with anxious mood state much of his anxiety has abated. He still has to deal with his young child is in kindergarten and he has to deal of taking care of his mother is elderly. Patient has more free time and is learning to do some therapy on his own. At his physical therapy. He's playing guitar is trying to be more active but his anxiety level seems to be well controlled taking Klonopin 0.5 mg when necessary daily. This patient she'll return to see me in 5 months.

## 2016-06-06 ENCOUNTER — Ambulatory Visit (HOSPITAL_COMMUNITY): Payer: Medicaid Other | Admitting: Psychiatry

## 2016-06-06 DIAGNOSIS — F4323 Adjustment disorder with mixed anxiety and depressed mood: Secondary | ICD-10-CM

## 2016-06-06 NOTE — Progress Notes (Signed)
   THERAPIST PROGRESS NOTE   Session Time: 2:05-3:00  Participation Level:Active   Behavioral Response:Well GroomedAlertAnxious   Type of Therapy: Individual Therapy   Treatment Goals addressed: Anxiety and Coping   Interventions:Solution Focused, Strength-based, Supportive and Reframing   Summary: Pt. Continues to as anxious and agitated. Pt. Was joined for end of session with 46 year old son Cristal DeerChristopher. Pt. Stated that his son's start of school has triggered his anxiety and recent communications between his sister and his son's mother indicating her desire to see th child. Pt. Continues to express his concern about his son's mother seeing the child and interfering with the child's emotional and academic development. Pt. Demonstrated pattern of jumping to conclusions related to feedback from teacher and the communications with his sister. Counselor worked with Pt. To avoid jumping to irrational conclusions that trigger his anxiety. Counselor also provided some psychoeducation regarding normal behavior for a 45 year old in social situations I.e., excitement and adjustment to new learning/social environment.  Suicidal/Homicidal:Nowithout intent/plan   Therapist Response: Assessed patients current functioning and reviewed progress. Reviewed coping strategies. Assessed patients safety and assisted in identifying protective factors. Assisted patient with the expression of anxiety and frustration.  Plan: Pt. To continue with person-centered and strength based therapy.Return again in 4-6 weeks.   Diagnosis: Axis I:Adjustment Disorder with Mixed Emotional Features     [name], Jennifer B, [facility] 06/06/2016

## 2016-06-26 ENCOUNTER — Emergency Department (HOSPITAL_COMMUNITY)
Admission: EM | Admit: 2016-06-26 | Discharge: 2016-06-26 | Disposition: A | Discharge status: Home / Self Care | Payer: Medicaid Other | Attending: Emergency Medicine | Admitting: Emergency Medicine

## 2016-06-26 ENCOUNTER — Emergency Department (HOSPITAL_COMMUNITY): Payer: Medicaid Other

## 2016-06-26 ENCOUNTER — Encounter (HOSPITAL_COMMUNITY): Payer: Self-pay | Admitting: *Deleted

## 2016-06-26 DIAGNOSIS — Y999 Unspecified external cause status: Secondary | ICD-10-CM | POA: Diagnosis not present

## 2016-06-26 DIAGNOSIS — M25562 Pain in left knee: Secondary | ICD-10-CM

## 2016-06-26 DIAGNOSIS — Y929 Unspecified place or not applicable: Secondary | ICD-10-CM | POA: Diagnosis not present

## 2016-06-26 DIAGNOSIS — Z7982 Long term (current) use of aspirin: Secondary | ICD-10-CM | POA: Diagnosis not present

## 2016-06-26 DIAGNOSIS — F909 Attention-deficit hyperactivity disorder, unspecified type: Secondary | ICD-10-CM | POA: Insufficient documentation

## 2016-06-26 DIAGNOSIS — F17210 Nicotine dependence, cigarettes, uncomplicated: Secondary | ICD-10-CM | POA: Insufficient documentation

## 2016-06-26 DIAGNOSIS — X509XXA Other and unspecified overexertion or strenuous movements or postures, initial encounter: Secondary | ICD-10-CM | POA: Diagnosis not present

## 2016-06-26 DIAGNOSIS — Y9301 Activity, walking, marching and hiking: Secondary | ICD-10-CM | POA: Diagnosis not present

## 2016-06-26 NOTE — ED Provider Notes (Signed)
[facility]-EMERGENCY DEPT Provider Note   CSN: [id number] Arrival date & time: 06/26/16  1720  By signing my name below, I, Clovis Pu, attest that this documentation has been prepared under the direction and in the presence of  [employer], PA-C . Electronically Signed: Clovis Pu, ED Scribe. 06/26/16. 7:49 PM.  History   Chief Complaint Chief Complaint  Patient presents with  . Knee Pain   The history is provided by the patient. No language interpreter was used.   HPI Comments:  Kevin Hall is a 46 y.o. male who presents to the Emergency Department complaining of worsened, moderate left knee pain x 4 days. Pt states he has had problems with his left knee x 8 years. He state his left knee is currently locked, from sitting, and he is unable to straighten his left leg. He states the clicking and popping to the left knee is not new. Pt is followed by Dr. Selena Batten since 05/17. He has tried a 3 step topical cream with no relief. Pt denies numbness, any other associated symptoms and any other modifying factors at this time. Pt was seen by an orthopedist at [facility] in 2012 after he felt his knee snap while showering.   Past Medical History:  Diagnosis Date  . Achilles tendinitis   . Anxiety   . Back pain   . Depression   . Shoulder dislocation     Patient Active Problem List   Diagnosis Date Noted  . Generalized anxiety disorder 02/08/2016  . Achilles tendon disorder 10/14/2014  . Low back pain 01/09/2014  . HLD (hyperlipidemia) 01/09/2014  . Health care maintenance 12/29/2013  . Tobacco abuse 12/29/2013  . Sciatica 12/29/2013  . Adjustment disorder with mixed anxiety and depressed mood 11/06/2012  . 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 HERNIA REPAIR    . KNEE ARTHROSCOPY    . TONSILLECTOMY AND ADENOIDECTOMY         Home Medications    Prior to Admission medications     Medication Sig Start Date End Date Taking? Authorizing Provider  acetaminophen-codeine (TYLENOL #3) 300-30 MG per tablet Take 1 tablet by mouth every 6 (six) hours as needed for moderate pain. Patient not taking: Reported on 02/08/2015 07/03/14   Vivi Barrack, DPM  amoxicillin-clavulanate (AUGMENTIN) 875-125 MG tablet Take 1 tablet by mouth 2 (two) times daily.    Historical Provider, MD  aspirin 325 MG tablet Take 325 mg by mouth daily.    Historical Provider, MD  ciprofloxacin (CIPRO) 500 MG tablet Take 1 tablet (500 mg total) by mouth 2 (two) times daily. Patient not taking: Reported on 02/08/2015 11/14/14   Harle Battiest, NP  clonazePAM (KLONOPIN) 0.5 MG tablet Take 1 tablet (0.5 mg total) by mouth 3 (three) times daily as needed for anxiety. 03/02/16 03/02/17  Archer Asa, MD  cyclobenzaprine (FLEXERIL) 10 MG tablet Take 1 tablet (10 mg total) by mouth 3 (three) times daily as needed for muscle spasms. Patient not taking: Reported on 02/08/2015 02/18/14   Ivonne Andrew, PA-C  diclofenac sodium (VOLTAREN) 1 % GEL Apply 2 g topically 2 (two) times daily. Rub into affected area of foot 2 to 4 times daily Patient not taking: Reported on 02/08/2015 07/03/14   Vivi Barrack, DPM  diphenhydrAMINE (BENADRYL) 25 mg capsule Take 25 mg by mouth every 6 (six) hours as needed for allergies.    Historical Provider, MD  HYDROcodone-acetaminophen (  NORCO/VICODIN) 5-325 MG tablet Take 1 tablet by mouth every 4 (four) hours as needed. 10/26/15   Jacalyn Lefevre, MD  ibuprofen (ADVIL,MOTRIN) 800 MG tablet Take 1 tablet (800 mg total) by mouth every 8 (eight) hours as needed. Takes 800mg  in the morning, and two tablets in the evening Patient not taking: Reported on 02/08/2015 05/15/14   Vivi Barrack, DPM  meloxicam (MOBIC) 15 MG tablet Take 1 tablet (15 mg total) by mouth daily. Patient not taking: Reported on 02/08/2015 01/04/15   Dahlia Client Muthersbaugh, PA-C  methocarbamol (ROBAXIN) 500 MG tablet Take 1 tablet  (500 mg total) by mouth 2 (two) times daily. Patient not taking: Reported on 02/08/2015 01/04/15   Dahlia Client Muthersbaugh, PA-C  metroNIDAZOLE (FLAGYL) 500 MG tablet Take 1 tablet (500 mg total) by mouth 2 (two) times daily. Patient not taking: Reported on 02/08/2015 11/14/14   Harle Battiest, NP  nitroGLYCERIN (NITRODUR - DOSED IN MG/24 HR) 0.2 mg/hr patch Place 0.2 mg onto the skin daily.    Historical Provider, MD  predniSONE (STERAPRED UNI-PAK 21 TAB) 10 MG (21) TBPK tablet Take 1 tablet (10 mg total) by mouth daily. Take 6 tabs by mouth daily  for 2 days, then 5 tabs for 2 days, then 4 tabs for 2 days, then 3 tabs for 2 days, 2 tabs for 2 days, then 1 tab by mouth daily for 2 days 10/26/15   Jacalyn Lefevre, MD  traMADol (ULTRAM) 50 MG tablet Take 1 tablet (50 mg total) by mouth every 6 (six) hours as needed. Patient not taking: Reported on 02/08/2015 11/14/14   Harle Battiest, NP  traMADol (ULTRAM) 50 MG tablet Take 1 tablet (50 mg total) by mouth every 8 (eight) hours as needed. Patient not taking: Reported on 10/26/2015 03/18/15   Almon Hercules, MD    Family History Family History  Problem Relation Age of Onset  . Anxiety disorder Mother   . Depression Mother   . Diabetes Mother   . Hypertension Mother   . Thyroid disease Mother   . Cancer - Lung Father   . Alcohol abuse Father   . Cancer Father   . Bipolar disorder Brother   . Anxiety disorder Sister     Social History Social History  Substance Use Topics  . Smoking status: Current Every Day Smoker    Packs/day: 0.50    Types: Cigarettes  . Smokeless tobacco: Never Used  . Alcohol use No     Comment: a few beers every month     Allergies   Patient has no known allergies.   Review of Systems Review of Systems 10 systems reviewed and all are negative for acute change except as noted in the HPI.  Physical Exam Updated Vital Signs BP 140/90 (BP Location: Right Arm)   Pulse 77   Temp 97.9 F (36.6 C) (Oral)   Resp 16    Ht 5\' 9"  (1.753 m)   Wt 83.9 kg   SpO2 99%   BMI 27.32 kg/m   Physical Exam  Constitutional: He is oriented to person, place, and time. He appears well-developed and well-nourished. No distress.  HENT:  Head: Normocephalic and atraumatic.  Eyes: Conjunctivae are normal.  Cardiovascular: Normal rate.   Pulmonary/Chest: Effort normal.  Abdominal: He exhibits no distension.  Musculoskeletal: He exhibits edema.  No obvious swelling or edema. No warmth to touch  Unable to complete physical exam. Pt is exteremly anxious and withdrawing from touch.   Neurological: He is alert and  oriented to person, place, and time.  Skin: Skin is warm and dry.  Psychiatric: He has a normal mood and affect.  Nursing note and vitals reviewed.  ED Treatments / Results  DIAGNOSTIC STUDIES:  Oxygen Saturation is 98% on RA, normal by my interpretation.    COORDINATION OF CARE:  7:45 PM Discussed treatment plan with pt at bedside and pt agreed to plan.  Labs (all labs ordered are listed, but only abnormal results are displayed) Labs Reviewed - No data to display  EKG  EKG Interpretation None       Radiology Dg Knee Complete 4 Views Left  Result Date: 06/26/2016 CLINICAL DATA:  Left knee pain over 1 year.  No trauma. EXAM: LEFT KNEE - COMPLETE 4+ VIEW COMPARISON:  None. FINDINGS: No evidence of fracture, dislocation, or joint effusion. No evidence of arthropathy or other focal bone abnormality. Soft tissues are unremarkable. IMPRESSION: Negative. Electronically Signed   By: Sherian ReinWei-Chen  Lin M.D.   On: 06/26/2016 20:53    Procedures Procedures (including critical care time)  Medications Ordered in ED Medications - No data to display   Initial Impression / Assessment and Plan / ED Course  I have reviewed the triage vital signs and the nursing notes.  Pertinent labs & imaging results that were available during my care of the patient were reviewed by me and considered in my medical decision  making (see chart for details).  Clinical Course     Labs:   Imaging:   Consults:   Therapeutics:   Discharge Meds:  Assessment/Plan:   45, presents today with left knee pain. My external exam shows no significant swelling or edema, no warmth to touch. Patient is very anxious, fidgeting in his chair, not wanting to make eye contact. His explanations are very long and not to the point. From what I can gather patient's knee pain is somewhat chronic in nature with acute worsening today. He describes clicking and popping as if he is unable to completely straighten it due to it being locked. He was able to straighten it while ambulating around here in the ED on crutches. I have very low suspicion for an infectious etiology in this patient, plain film showed no acute findings' I question meniscal injury.  Patient will be placed in a knee immobilizer and given follow-up information for orthopedics. Patient given strict return precautions, follow up information, he verbalized understanding and agreement to today's plan had no further questions or concerns at time of discharge  Final Clinical Impressions(s) / ED Diagnoses   Final diagnoses:  Acute pain of left knee    New Prescriptions Discharge Medication List as of 06/26/2016  9:12 PM    I personally performed the services described in this documentation, which was scribed in my presence. The recorded information has been reviewed and is accurate.    Eyvonne MechanicJeffrey Mataeo Ingwersen, PA-C 06/27/16 [id number]    Benjiman CoreNathan Pickering, MD 06/28/16 [phone]

## 2016-06-26 NOTE — ED Triage Notes (Signed)
The pt is c/o lt knee pain since 2010  And for the past 3-4 days he has had more pain  And weakness today

## 2016-06-26 NOTE — ED Notes (Signed)
Pt states that on Saturday he was walking out of a store when he felt like his knee was going to dislocate but it never did. Pt states this sensation has been going on for years. Pt states he woke up this morning with pain and weakness in his left leg. Pt does have crutches that he came in with but they appear as though they are not the appropriate size. Pt denies any recent injury.

## 2016-06-26 NOTE — Progress Notes (Signed)
Orthopedic Tech Progress Note Patient Details:  Kevin SizerJoshua Hall Dec 07, 1970 [id number]  Ortho Devices Type of Ortho Device: Knee Immobilizer Ortho Device/Splint Location: Applied Knee Immobilizer to pt left leg.  Provided care and training instructions. Pt was like grandson. Pt stated he wanted to take off Knee immoblizer to get into car and to drive.  I advised pt to allow Doctor to provided additional instructions for use. Ortho Device/Splint Interventions: Application   Alvina ChouWilliams, Marise Knapper C 06/26/2016, 9:40 PM

## 2016-06-26 NOTE — Discharge Instructions (Signed)
Please read attached information. If you experience any new or worsening signs or symptoms please return to the emergency room for evaluation. Please follow-up with your primary care provider or specialist as discussed. Please use medication prescribed only as directed and discontinue taking if you have any concerning signs or symptoms.   °

## 2016-06-28 ENCOUNTER — Ambulatory Visit (INDEPENDENT_AMBULATORY_CARE_PROVIDER_SITE_OTHER): Payer: Medicaid Other | Admitting: Orthopaedic Surgery

## 2016-06-28 ENCOUNTER — Telehealth (INDEPENDENT_AMBULATORY_CARE_PROVIDER_SITE_OTHER): Payer: Self-pay | Admitting: Orthopedic Surgery

## 2016-06-28 ENCOUNTER — Encounter (INDEPENDENT_AMBULATORY_CARE_PROVIDER_SITE_OTHER): Payer: Self-pay | Admitting: Orthopaedic Surgery

## 2016-06-28 VITALS — Ht 69.0 in | Wt 185.0 lb

## 2016-06-28 DIAGNOSIS — M25562 Pain in left knee: Secondary | ICD-10-CM | POA: Diagnosis not present

## 2016-06-28 DIAGNOSIS — G8929 Other chronic pain: Secondary | ICD-10-CM | POA: Diagnosis not present

## 2016-06-28 NOTE — Progress Notes (Signed)
Office Visit Note   Patient: Kevin Hall           Date of Birth: 05-05-1971           MRN: [id number] Visit Date: 06/28/2016              Requested by: Almon Hercules, MD [address] [place], [place] [address] PCP: Almon Hercules, MD   Assessment & Plan: Visit Diagnoses:  1. Chronic pain of left knee     Plan: Is really difficult to get a good examination of the patient. He has pain out of proportion of exam and I think it is knee bending. He's been in and out of the ER and said this point an MRI is warranted or left knee to assess the cartilage in her rule out a acute meniscal injury this keeping his knee locked. We'll see him back after the MRI. He'll come in and out of the knee immobilizer as comfort allows and I will try get his knee bending.  Follow-Up Instructions: Return in about 2 weeks (around 07/12/2016).   Orders:  No orders of the defined types were placed in this encounter.  No orders of the defined types were placed in this encounter.     Procedures: No procedures performed   Clinical Data: No additional findings.   Subjective: Chief Complaint  Patient presents with  . Left Knee - Follow-up    HPI He is a patient referred from emergency room. He says he has significant cartilage damage in his knee from years of wear. He went to the emergency room on 06/26/2016 and he was placed in a knee immobilizer when they can get his knee bending. He was then sent here for follow-up since we were on call then. He says his knee locks and catches on him and has severe damage and it. Review of Systems   Objective: Vital Signs: Ht 5\' 9"  (1.753 m)   Wt 185 lb (83.9 kg)   BMI 27.32 kg/m   Physical Exam He is alert and oriented 3 and it does not appear in any acute distress Ortho Exam It's really difficult to get a good exam out of his knee due to his pain out of proportion of exam. There is no redness lateral since there is an effusion. I can't really get a good  joint line exam her Lockman's or McMurray's exam due to the severity of his pain I can get his knee bending. Specialty Comments:  No specialty comments available.  Imaging: No results found. X-rays on the cone system of his left knee show no acute bony irregularities or malalignment. His nose fracture no effusion.  PMFS History: Patient Active Problem List   Diagnosis Date Noted  . Generalized anxiety disorder 02/08/2016  . Achilles tendon disorder 10/14/2014  . Low back pain 01/09/2014  . HLD (hyperlipidemia) 01/09/2014  . Health care maintenance 12/29/2013  . Tobacco abuse 12/29/2013  . Sciatica 12/29/2013  . Adjustment disorder with mixed anxiety and depressed mood 11/06/2012  . 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 Achilles tendinitis   . Anxiety   . Back pain   . Depression   . Shoulder dislocation     Family History  Problem Relation Age of Onset  . Anxiety disorder Mother   . Depression Mother   . Diabetes Mother   . Hypertension Mother   . Thyroid disease Mother   .  Cancer - Lung Father   . Alcohol abuse Father   . Cancer Father   . Bipolar disorder Brother   . Anxiety disorder Sister     Past Surgical History:  Procedure Laterality Date  . HERNIA REPAIR    . KNEE ARTHROSCOPY    . TONSILLECTOMY AND ADENOIDECTOMY     Social History   Occupational History  . Not on file.   Social History Main Topics  . Smoking status: Current Every Day Smoker    Packs/day: 0.50    Types: Cigarettes  . Smokeless tobacco: Never Used  . Alcohol use No     Comment: a few beers every month   . Drug use: No     Comment: Drug use twenty years ago   . Sexual activity: Not Currently

## 2016-06-28 NOTE — Telephone Encounter (Signed)
Patient called triage line this morning. He advised he went to [facility] on 06/26/16. He was told to follow up with orthopedic. Dr. Magnus IvanBlackman was on call for the office, I offered him an appointment this morning but states he has no means for transportation. Wanted to see Dr. Otelia SergeantNitka maybe on Friday. Advised that Dr. Otelia SergeantNitka is in surgery part of the day and has full schedule that am, and patient has not been seen by Dr. Otelia SergeantNitka since 2012. He will call us back when he can arrange transportation and we will work him into the schedule.

## 2016-06-29 ENCOUNTER — Other Ambulatory Visit (INDEPENDENT_AMBULATORY_CARE_PROVIDER_SITE_OTHER): Payer: Self-pay

## 2016-06-29 DIAGNOSIS — M25562 Pain in left knee: Secondary | ICD-10-CM

## 2016-07-04 ENCOUNTER — Telehealth (INDEPENDENT_AMBULATORY_CARE_PROVIDER_SITE_OTHER): Payer: Self-pay | Admitting: Orthopaedic Surgery

## 2016-07-04 NOTE — Telephone Encounter (Signed)
He can just follow-up as needed and can hold off on the MRI since he is doing well.

## 2016-07-04 NOTE — Telephone Encounter (Signed)
Noted and order has been changed

## 2016-07-04 NOTE — Telephone Encounter (Signed)
Patient states he would like to hold off on MRI for now

## 2016-07-04 NOTE — Telephone Encounter (Signed)
Please advise 

## 2016-07-18 ENCOUNTER — Ambulatory Visit (INDEPENDENT_AMBULATORY_CARE_PROVIDER_SITE_OTHER): Payer: Medicaid Other | Admitting: Orthopaedic Surgery

## 2016-07-18 ENCOUNTER — Ambulatory Visit (INDEPENDENT_AMBULATORY_CARE_PROVIDER_SITE_OTHER): Payer: Medicaid Other | Admitting: Psychiatry

## 2016-07-18 ENCOUNTER — Other Ambulatory Visit (INDEPENDENT_AMBULATORY_CARE_PROVIDER_SITE_OTHER): Payer: Self-pay

## 2016-07-18 ENCOUNTER — Encounter (INDEPENDENT_AMBULATORY_CARE_PROVIDER_SITE_OTHER): Payer: Self-pay | Admitting: Orthopaedic Surgery

## 2016-07-18 ENCOUNTER — Telehealth (INDEPENDENT_AMBULATORY_CARE_PROVIDER_SITE_OTHER): Payer: Self-pay

## 2016-07-18 DIAGNOSIS — F4323 Adjustment disorder with mixed anxiety and depressed mood: Secondary | ICD-10-CM

## 2016-07-18 DIAGNOSIS — M25562 Pain in left knee: Secondary | ICD-10-CM | POA: Insufficient documentation

## 2016-07-18 NOTE — Progress Notes (Signed)
This is a patient I've seen recently. He is referred from emergency room to evaluate left knee pain. Exam but with a crutch and he been wearing a knee immobilizer but suggested he get out of the knee immobilizer. He comes today [facility] hoping that I will take him over to patient. He has chronic issues with his right knee as well as right Achilles tendon and this is all been well documented. His left nasal is frustrating him the most. He is tried and failed all forms conservative treatment at this point. He says his knee gives out on him. He is able to crutch. He's been in and out of his knee immobilizer.  On examination of his left knee is patella does track laterally. There is a slight effusion. There is global pain is still difficult to get a good exam out of his knee. He does bend it back and forth better.  At this point an MRI is warranted to assess the ligaments of his knee as well as a cartilage and see if there is a plica as well as assessing the medial patella retinacular ligament. He'll continue to come in and out of the knee immobilizer and ambulated with a crutch area and I'll see him back in 2 weeks after MRIs been obtained to go over his left knee. I can review all of his records at that point as well to see what else we can recommend.

## 2016-07-18 NOTE — Telephone Encounter (Signed)
Was it for MRI L Knee wo contrast? If so,  Per phone note dated 07/04/16 pt called stating he wants to hold off on the MRI at this time, is pt wanting to go ahead with the MRI?

## 2016-07-18 NOTE — Telephone Encounter (Signed)
Noted and will get pt scheduled

## 2016-07-18 NOTE — Telephone Encounter (Signed)
Dr. Magnus IvanBlackman wanted me to order an MRI, but I just recently put an order in. I can never tell/see the status of this. I was just checking.

## 2016-07-18 NOTE — Telephone Encounter (Signed)
He was here today and Kevin Hall wants to proceed aswell as the patient.Thanks!

## 2016-07-20 NOTE — Progress Notes (Signed)
   THERAPIST PROGRESS NOTE  Session Time: 3:10-4:00  Participation Level:Active   Behavioral Response:Well GroomedAlertEuthymic  Type of Therapy: Individual Therapy   Treatment Goals addressed: Anxiety and Coping   Interventions:Solution Focused, Strength-based, Supportive and Reframing   Summary: Pt. Presents as significantly less agitated compared to previous sessions. Pt. Reports that his son's mother appeared on the child's birthday a few days before  Christmas. Pt. Discussed that he opened the door, asked what she wanted, she placed gifts at the door and left. Significant time was spent processing the series of events that Pt. Has feared and ruminated about for several years. Pt. Discussed the significance of the event and how the fact that the reality was not threatening has affected his level of anxiety. Counselor helped to assist Pt. In developing his awareness of how his fears have kept him in an anxious state and having experienced the even is now able to be more at peace about his child's safety. Pt continues to complain of foot and leg pain. Pt. Continues to be preoccupied with his son's development and concerns about how he is doing in kindergarten. Pt.'s son was brought into the end of the session. Pt.'s son presents as happy, bright, curious 28six year old.  Suicidal/Homicidal:Nowithout intent/plan   Therapist Response: Assessed patients current functioning and reviewed progress. Reviewed coping strategies. Assessed patients safety and assisted in identifying protective factors. Assisted patient with the expression of anxiety and frustration.  Plan: Pt. To continue with person-centered and strength based therapy.Return again in 4-6 weeks.   Diagnosis: Axis I:Adjustment Disorder with Mixed Emotional Features      [name], Nazarene Bunning B, [facility] 07/20/2016

## 2016-07-26 ENCOUNTER — Ambulatory Visit
Admission: RE | Admit: 2016-07-26 | Discharge: 2016-07-26 | Disposition: A | Discharge status: Home / Self Care | Payer: Medicaid Other | Source: Ambulatory Visit | Attending: Orthopaedic Surgery | Admitting: Orthopaedic Surgery

## 2016-07-26 DIAGNOSIS — M25562 Pain in left knee: Secondary | ICD-10-CM

## 2016-08-02 ENCOUNTER — Ambulatory Visit (INDEPENDENT_AMBULATORY_CARE_PROVIDER_SITE_OTHER): Payer: Medicaid Other | Admitting: Psychiatry

## 2016-08-02 ENCOUNTER — Encounter (HOSPITAL_COMMUNITY): Payer: Self-pay | Admitting: Psychiatry

## 2016-08-02 DIAGNOSIS — Z811 Family history of alcohol abuse and dependence: Secondary | ICD-10-CM

## 2016-08-02 DIAGNOSIS — Z8349 Family history of other endocrine, nutritional and metabolic diseases: Secondary | ICD-10-CM

## 2016-08-02 DIAGNOSIS — Z9889 Other specified postprocedural states: Secondary | ICD-10-CM | POA: Diagnosis not present

## 2016-08-02 DIAGNOSIS — Z833 Family history of diabetes mellitus: Secondary | ICD-10-CM | POA: Diagnosis not present

## 2016-08-02 DIAGNOSIS — F4323 Adjustment disorder with mixed anxiety and depressed mood: Secondary | ICD-10-CM | POA: Diagnosis not present

## 2016-08-02 DIAGNOSIS — Z801 Family history of malignant neoplasm of trachea, bronchus and lung: Secondary | ICD-10-CM

## 2016-08-02 DIAGNOSIS — Z79899 Other long term (current) drug therapy: Secondary | ICD-10-CM

## 2016-08-02 DIAGNOSIS — Z818 Family history of other mental and behavioral disorders: Secondary | ICD-10-CM | POA: Diagnosis not present

## 2016-08-02 DIAGNOSIS — Z8249 Family history of ischemic heart disease and other diseases of the circulatory system: Secondary | ICD-10-CM

## 2016-08-02 DIAGNOSIS — F17210 Nicotine dependence, cigarettes, uncomplicated: Secondary | ICD-10-CM

## 2016-08-02 DIAGNOSIS — Z808 Family history of malignant neoplasm of other organs or systems: Secondary | ICD-10-CM

## 2016-08-02 MED ORDER — CLONAZEPAM 0.5 MG PO TABS: ORAL_TABLET | ORAL | 5 refills | Status: DC

## 2016-08-02 NOTE — Progress Notes (Signed)
[facility] MD Progress Note  08/02/2016 1:38 PM Kevin Hall  MRN:  [id number] Subjective:  No real change doing okay Principal Problem: Adjustment disorder with anxious mood state Today the patient is seen one time. Generally he is distressed because he's having great problems with knee. Patient has multiple orthopedic complaints. On his last visit he should spinal stenosis. He's been having a chronic problem right knee and ankle. Recently the patient feels like he is her left knee and is using a cane to get around. He's very distressed. With the emergency room and was set up to see a surgeon. He saw patient had an MRI and according to him the MRI is unremarkable. He sees his surgeon Dr. Magnus Ivan tomorrow. Patient says she sleeping and eating well. The patient denies the use of alcohol or drugs. She's very invested in his 47-year-old son. His mother is doing okay she lives with him as well. Overall the patient is trying to survive. Noted is that he takes 3 hydrocodone from his primary care doctor for his chronic pain. He himself has reduced his Klonopin and is on a very small dose this time. He continues in therapy with Victorino Dike brown in the setting. The patient demonstrates no evidence of psychosis. Is not suicidal. . Total Time spent with patient: 15 minutes  Past Psychiatric History:   Past Medical History:  Past Medical History:  Diagnosis Date  . Achilles tendinitis   . Anxiety   . Back pain   . Depression   . Shoulder dislocation     Past Surgical History:  Procedure Laterality Date  . HERNIA REPAIR    . KNEE ARTHROSCOPY    . TONSILLECTOMY AND ADENOIDECTOMY     Family History:  Family History  Problem Relation Age of Onset  . Anxiety disorder Mother   . Depression Mother   . Diabetes Mother   . Hypertension Mother   . Thyroid disease Mother   . Cancer - Lung Father   . Alcohol abuse Father   . Cancer Father   . Bipolar disorder Brother   . Anxiety disorder Sister    Family  Psychiatric  History:  Social History:  History  Alcohol Use No    Comment: a few beers every month      History  Drug Use No    Comment: Drug use twenty years ago     Social History   Social History  . Marital status: Divorced    Spouse name: N/A  . Number of children: N/A  . Years of education: N/A   Social History Main Topics  . Smoking status: Current Every Day Smoker    Packs/day: 1.00    Years: 31.00    Types: Cigarettes  . Smokeless tobacco: Never Used  . Alcohol use No     Comment: a few beers every month   . Drug use: No     Comment: Drug use twenty years ago   . Sexual activity: Not Currently   Other Topics Concern  . None   Social History Narrative  . None   Additional Social History:                         Sleep: Good  Appetite:  Good  Current Medications: Current Outpatient Prescriptions  Medication Sig Dispense Refill  . atorvastatin (LIPITOR) 40 MG tablet Take 40 mg by mouth daily.  5  . clonazePAM (KLONOPIN) 0.5 MG tablet 1  qday  1  prn 60 tablet 5  . Diclofenac Sodium 3 % GEL APPLY 2-3 GRAMS (1 GRAM=1 INCH) TO AFFECTED AREA 4 TIMES DAILY  5  . Doxepin HCl 5 % CREA apply 2 grams (2 grams=2 inches) to affected area(s) 3 times daily.  5  . gemfibrozil (LOPID) 600 MG tablet Take 600 mg by mouth 2 (two) times daily.  12  . HYDROcodone-acetaminophen (NORCO/VICODIN) 5-325 MG tablet Take 1 tablet by mouth every 4 (four) hours as needed. 10 tablet 0  . ibuprofen (ADVIL,MOTRIN) 400 MG tablet Take 400 mg by mouth 3 (three) times daily.  2  . ibuprofen (ADVIL,MOTRIN) 800 MG tablet Take 1 tablet (800 mg total) by mouth every 8 (eight) hours as needed. Takes 800mg  in the morning, and two tablets in the evening 30 tablet 0  . lidocaine (XYLOCAINE) 5 % ointment APPLY 2-3 (1 GRAM=1 INCH) GRAM TO AFFECTED AREA 4 TIMES A DAY  5  . methocarbamol (ROBAXIN) 500 MG tablet Take 1 tablet (500 mg total) by mouth 2 (two) times daily. 20 tablet 0  . VASCEPA 1  g CAPS TAKE 2 CAPSULES BY MOUTH TWICE A DAY FOR CHOLESTEROL  12  . acetaminophen-codeine (TYLENOL #3) 300-30 MG per tablet Take 1 tablet by mouth every 6 (six) hours as needed for moderate pain. (Patient not taking: Reported on 08/02/2016) 20 tablet 0  . amoxicillin-clavulanate (AUGMENTIN) 875-125 MG tablet Take 1 tablet by mouth 2 (two) times daily.    Marland Kitchen aspirin 325 MG tablet Take 325 mg by mouth daily.    . ciprofloxacin (CIPRO) 500 MG tablet Take 1 tablet (500 mg total) by mouth 2 (two) times daily. (Patient not taking: Reported on 02/08/2015) 14 tablet 0  . cyclobenzaprine (FLEXERIL) 10 MG tablet Take 1 tablet (10 mg total) by mouth 3 (three) times daily as needed for muscle spasms. (Patient not taking: Reported on 02/08/2015) 30 tablet 0  . diclofenac sodium (VOLTAREN) 1 % GEL Apply 2 g topically 2 (two) times daily. Rub into affected area of foot 2 to 4 times daily (Patient not taking: Reported on 02/08/2015) 100 g 2  . diphenhydrAMINE (BENADRYL) 25 mg capsule Take 25 mg by mouth every 6 (six) hours as needed for allergies.    . meloxicam (MOBIC) 15 MG tablet Take 1 tablet (15 mg total) by mouth daily. (Patient not taking: Reported on 02/08/2015) 30 tablet 0  . metroNIDAZOLE (FLAGYL) 500 MG tablet Take 1 tablet (500 mg total) by mouth 2 (two) times daily. (Patient not taking: Reported on 02/08/2015) 14 tablet 0  . nitroGLYCERIN (NITRODUR - DOSED IN MG/24 HR) 0.2 mg/hr patch Place 0.2 mg onto the skin daily.    . predniSONE (STERAPRED UNI-PAK 21 TAB) 10 MG (21) TBPK tablet Take 1 tablet (10 mg total) by mouth daily. Take 6 tabs by mouth daily  for 2 days, then 5 tabs for 2 days, then 4 tabs for 2 days, then 3 tabs for 2 days, 2 tabs for 2 days, then 1 tab by mouth daily for 2 days (Patient not taking: Reported on 07/18/2016) 42 tablet 0  . traMADol (ULTRAM) 50 MG tablet Take 1 tablet (50 mg total) by mouth every 6 (six) hours as needed. (Patient not taking: Reported on 02/08/2015) 15 tablet 0  . traMADol  (ULTRAM) 50 MG tablet Take 1 tablet (50 mg total) by mouth every 8 (eight) hours as needed. (Patient not taking: Reported on 10/26/2015) 20 tablet 0   No current facility-administered medications for  this visit.     Lab Results: No results found for this or any previous visit (from the past 48 hour(s)).  Blood Alcohol level:  [facility] results found for: [facility]  Physical Findings: AIMS:  , ,  ,  ,    CIWA:    COWS:     Musculoskeletal: Strength & Muscle Tone: within normal limits Gait & Station: normal Patient leans: N/A  Psychiatric Specialty Exam: ROS  Blood pressure 124/76, pulse 93, height 5\' 10"  (1.778 m), weight 188 lb (85.3 kg), SpO2 97 %.Body mass index is 26.98 kg/m.  General Appearance: Casual  Eye Contact::  Fair  Speech:  Clear and Coherent  Volume:  Normal  Mood:  Dysphoric  Affect:  Congruent  Thought Process:  Coherent  Orientation:  Full (Time, Place, and Person)  Thought Content:  WDL  Suicidal Thoughts:  No  Homicidal Thoughts:  No  Memory:  NA  Judgement:  Fair  Insight:  Fair  Psychomotor Activity:  Normal  Concentration:  Fair  Recall:  Good  Fund of Knowledge:Fair  Language: Good   Akathisia:  No  Handed:  Right  AIMS (if indicated):     Assets:  Communication Skills  ADL's:  Intact  Cognition: WNL  Sleep:      Treatment Plan Summary:  Today the patient is doing fairly well. He has upcoming meeting Industrial/product designertomorrow surgeon. He is very dedicated to his son will do everything he can. Today we talked about the possibility that he may go to rehabilitation she'll fact they need to replace his knee. Normally sure exactly what's going to happen. For now the patient continue taking Klonopin a very low dose 0.5 mg a day. May take an actual needed. At this time I believe he is at his baseline. Questionable be how he handles the treatment of his left knee. This patient is apply for disability multiple times for his orthopedic problems. I suspect is very frustrated that he  is denied each time. This patient to return to see me in 3 or 4 months.

## 2016-08-03 ENCOUNTER — Ambulatory Visit (INDEPENDENT_AMBULATORY_CARE_PROVIDER_SITE_OTHER): Payer: Medicaid Other | Admitting: Orthopaedic Surgery

## 2016-08-03 DIAGNOSIS — M25562 Pain in left knee: Secondary | ICD-10-CM

## 2016-08-03 DIAGNOSIS — G8929 Other chronic pain: Secondary | ICD-10-CM | POA: Diagnosis not present

## 2016-08-03 MED ORDER — METHYLPREDNISOLONE ACETATE 40 MG/ML IJ SUSP
40.0000 mg | INTRAMUSCULAR | Status: AC | PRN
  Administered 2016-08-03: 40 mg via INTRA_ARTICULAR

## 2016-08-03 MED ORDER — LIDOCAINE HCL 1 % IJ SOLN
3.0000 mL | INTRAMUSCULAR | Status: AC | PRN
  Administered 2016-08-03: 3 mL

## 2016-08-03 NOTE — Progress Notes (Signed)
Office Visit Note   Patient: Kevin Hall           Date of Birth: July 15, 1970           MRN: [id number] Visit Date: 08/03/2016              Requested by: Almon Hercules, MD [address] [place], [place] [address] PCP: Almon Hercules, MD   Assessment & Plan: Visit Diagnoses:  1. Chronic pain of left knee     Plan: He tolerated the steroid injection was left knee. His work on [employer] strengthening exercises. From a surgery standpoint we'll let her option right now would be potentially an arthroscopic intervention with a lateral release but he would still need to work on [employer] strengthening. I'll see him back in 4 weeks see how he is done with his current strengthening exercises and the steroid injection.  Follow-Up Instructions: Return in about 4 weeks (around 08/31/2016).   Orders:  Orders Placed This Encounter  Procedures  . Large Joint Injection/Arthrocentesis   No orders of the defined types were placed in this encounter.     Procedures: Large Joint Inj Date/Time: 08/03/2016 1:32 PM Performed by: Kathryne Hitch Authorized by: Kathryne Hitch   Location:  Knee Site:  L knee Ultrasound Guidance: No   Fluoroscopic Guidance: No   Arthrogram: No   Medications:  3 mL lidocaine 1 %; 40 mg methylPREDNISolone acetate 40 MG/ML     Clinical Data: No additional findings.   Subjective: No chief complaint on file. Ration is here for follow-up of MRI of his left knee. He has known patella seems to track laterally is been having a lot of problems with his knee in general. He's been in and out of the ER at times because of this. He is a former patient Dr. Kevan Ny in the past as well.  HPI  Review of Systems   Objective: Vital Signs: There were no vitals taken for this visit.  Physical Exam  Ortho Exam There is no effusion of his left knee today but certainly there is weakness of the VMO. His patella does track laterally and the trochlear groove. Specialty  Comments:  No specialty comments available.  Imaging: No results found. The MRI does show a significant narrowing and shallow trochlea groove with a patella does track laterally and subluxate laterally. There is synovitis in the trochlea groove and there is a small undersurface tear of the lateral meniscus is small oblique tear that may not be of any consequence. There is no severe effusion in the knee.  PMFS History: Patient Active Problem List   Diagnosis Date Noted  . Acute pain of left knee 07/18/2016  . Generalized anxiety disorder 02/08/2016  . Achilles tendon disorder 10/14/2014  . Low back pain 01/09/2014  . HLD (hyperlipidemia) 01/09/2014  . Health care maintenance 12/29/2013  . Tobacco abuse 12/29/2013  . Sciatica 12/29/2013  . Adjustment disorder with mixed anxiety and depressed mood 11/06/2012  . 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 Achilles tendinitis   . Anxiety   . Back pain   . Depression   . Shoulder dislocation     Family History  Problem Relation Age of Onset  . Anxiety disorder Mother   . Depression Mother   . Diabetes Mother   . Hypertension Mother   . Thyroid disease Mother   . Cancer - Lung Father   .  Alcohol abuse Father   . Cancer Father   . Bipolar disorder Brother   . Anxiety disorder Sister     Past Surgical History:  Procedure Laterality Date  . HERNIA REPAIR    . KNEE ARTHROSCOPY    . TONSILLECTOMY AND ADENOIDECTOMY     Social History   Occupational History  . Not on file.   Social History Main Topics  . Smoking status: Current Every Day Smoker    Packs/day: 1.00    Years: 31.00    Types: Cigarettes  . Smokeless tobacco: Never Used  . Alcohol use No     Comment: a few beers every month   . Drug use: No     Comment: Drug use twenty years ago   . Sexual activity: Not Currently

## 2016-08-04 ENCOUNTER — Telehealth (INDEPENDENT_AMBULATORY_CARE_PROVIDER_SITE_OTHER): Payer: Self-pay

## 2016-08-04 NOTE — Telephone Encounter (Signed)
Patient called wanting to know what type of injection he received on yesterday from Dr. Magnus IvanBlackman.  Advised him that he received a cort. Injection.

## 2016-08-17 ENCOUNTER — Ambulatory Visit (INDEPENDENT_AMBULATORY_CARE_PROVIDER_SITE_OTHER): Payer: Medicaid Other | Admitting: Psychiatry

## 2016-08-17 DIAGNOSIS — F4323 Adjustment disorder with mixed anxiety and depressed mood: Secondary | ICD-10-CM | POA: Diagnosis not present

## 2016-08-22 NOTE — Progress Notes (Signed)
   THERAPIST PROGRESS NOTE  Session Time: 2:10-3:00  Participation Level:Active   Behavioral Response:Well GroomedAlertEuthymic  Type of Therapy: Individual Therapy   Treatment Goals addressed: Anxiety and Coping   Interventions:Solution Focused, Strength-based, Supportive and Reframing   Summary: Pt. Presents as agitated, defensive. Pt. Presented his son's report card and discussed his concerns about his son's performance. Pt. Discussed "I think his teacher is hinting around at something. Trying to lay the groundwork for something." Pt was asked what he was referring to and he was unable to state directly but suggested that his son's teacher was trying to provide a paper trail for recommendation for services for his son. Pt. Was asked if he had spoken with the teacher directly and asked if she had concerns about his behavior and academic performance. Pt. Responded "No." Pt. Discussed generally about wanting to call his son's mother because he would like to discuss the child's progress and have general support from her, but does not want to encourage her to have a relationship with the child. After reflecting the content of the Pt.'s concern, the Pt. Became defensive and angry with belief that Counselor was suggesting that  he was trying to keep the child from his mother and accused Counselor of "twisting my words". Pt. Is generally resistant to focusing on irrational beliefs that cause fear of the child's mother. Pt. Repeatedly states that the mother has had little to no contact with the son and no instances of hostile or unsafe boundaries on the part of the mother, but Pt. Persists in his hypervigilence towards the child's mother with belief that the child should be protected.  Suicidal/Homicidal:Nowithout intent/plan   Therapist Response: Assessed patients current functioning and reviewed progress. Reviewed coping strategies. Assessed patients safety and assisted in identifying  protective factors. Assisted patient with the expression of anxiety and frustration.  Plan: Pt. To continue with person-centered and strength based therapy.Return again in 4-6 weeks.   Diagnosis: Axis I:Adjustment Disorder with Mixed Emotional Features    [name], Kinley Ferrentino B, [facility] 08/22/2016

## 2016-08-31 ENCOUNTER — Ambulatory Visit (INDEPENDENT_AMBULATORY_CARE_PROVIDER_SITE_OTHER): Payer: Medicaid Other | Admitting: Orthopaedic Surgery

## 2016-08-31 DIAGNOSIS — M25562 Pain in left knee: Secondary | ICD-10-CM

## 2016-08-31 DIAGNOSIS — G8929 Other chronic pain: Secondary | ICD-10-CM | POA: Diagnosis not present

## 2016-08-31 NOTE — Progress Notes (Signed)
The patient is been trying physical therapy on his left knee. He has a shallow trochlea groove and his patella tracks laterally. He also has no meniscal tear was confirmed on MRI. His medial patellofemoral retinacular ligament is actually intact. He is ejection this point at surgery at some point on his knee. He's had arthroscopic intervention on his right knee before that worked Adult nursewonderfully.  On exam his left knee I can move it better. He has medial lateral joint line tenderness and patella does track laterally.  I talked about arthroscopic intervention of his knee showed him a knee model with over his MRI again. I would recommend arthroscopic intervention including a debridement, a partial meniscectomy and a lateral release. I gave him our surgery scheduler's card and number decides to have this done he would give us a call we can get this scheduled. We would then see him back at one week postoperative for suture removal.

## 2016-09-01 ENCOUNTER — Telehealth (INDEPENDENT_AMBULATORY_CARE_PROVIDER_SITE_OTHER): Payer: Self-pay | Admitting: Orthopaedic Surgery

## 2016-09-01 NOTE — Telephone Encounter (Signed)
FYI

## 2016-09-01 NOTE — Telephone Encounter (Signed)
FYI- Pt stated he was going to give it a few more weeks before making a decision on surgery.

## 2016-09-28 ENCOUNTER — Ambulatory Visit (HOSPITAL_COMMUNITY): Payer: Self-pay | Admitting: Psychiatry

## 2016-11-02 ENCOUNTER — Ambulatory Visit (INDEPENDENT_AMBULATORY_CARE_PROVIDER_SITE_OTHER): Payer: Medicaid Other | Admitting: Psychiatry

## 2016-11-02 DIAGNOSIS — F4325 Adjustment disorder with mixed disturbance of emotions and conduct: Secondary | ICD-10-CM | POA: Diagnosis not present

## 2016-11-06 NOTE — Progress Notes (Signed)
   THERAPIST PROGRESS NOTE   Session Time: 3:10-4:00  Participation Level:Active   Behavioral Response:Well GroomedAlertAnxious  Type of Therapy: Individual Therapy   Treatment Goals addressed: Anxiety and Coping   Interventions:Solution Focused, Strength-based, Supportive and Reframing   Summary: Pt. Continues to present as agitated, defensive. Pt. Continues to express his concerns about his son's performance. Pt. Has received feedback from his school's teachers that his son is challenged by inattention. Pt. Does not want the child to be tested because he does not want him labeled ADD. Counselor provided feedback that the testing will not make his child have the disorder, but it will give him, teachers and his mental health provider direction for how to treat him most effectively. Pt. Discussed that his son is having problems and seems addicted to his tablet. Counselor provided some psychoeducation about the importance of setting boundaries with the tablet, normalized his son's tantrums on withdrawal, and encouraged Pt. To maintain consistency with boundaries. Pt. Discussed his fears that his son with be hurt by other children on the playground and at school. Pt. Discussed his disbelief that his child has friends in kindergarten which came from his fears of his son being hurt. Counselor attempted to normalize his son's age appropriate relationships and the importance of his son having opportunities to engage in relationships so that he can develop emotional resilience.   Suicidal/Homicidal:Nowithout intent/plan   Therapist Response: Assessed patients current functioning and reviewed progress. Reviewed coping strategies. Assessed patients safety and assisted in identifying protective factors. Assisted patient with the expression of anxiety and frustration.  Plan: Pt. To continue with person-centered and strength based therapy.Return again in 4-6 weeks.   Diagnosis: Axis  I:Adjustment Disorder with Mixed Emotional Features    [name], Kevin Hall, [facility] 11/06/2016

## 2016-12-07 ENCOUNTER — Encounter (HOSPITAL_COMMUNITY): Payer: Self-pay | Admitting: Psychiatry

## 2016-12-07 ENCOUNTER — Ambulatory Visit (INDEPENDENT_AMBULATORY_CARE_PROVIDER_SITE_OTHER): Payer: Medicaid Other | Admitting: Psychiatry

## 2016-12-07 ENCOUNTER — Other Ambulatory Visit (HOSPITAL_COMMUNITY): Payer: Self-pay

## 2016-12-07 VITALS — BP 106/74 | Ht 69.0 in | Wt 184.0 lb

## 2016-12-07 DIAGNOSIS — F17210 Nicotine dependence, cigarettes, uncomplicated: Secondary | ICD-10-CM | POA: Diagnosis not present

## 2016-12-07 DIAGNOSIS — Z79899 Other long term (current) drug therapy: Secondary | ICD-10-CM

## 2016-12-07 DIAGNOSIS — Z818 Family history of other mental and behavioral disorders: Secondary | ICD-10-CM

## 2016-12-07 DIAGNOSIS — F4321 Adjustment disorder with depressed mood: Secondary | ICD-10-CM

## 2016-12-07 DIAGNOSIS — Z79891 Long term (current) use of opiate analgesic: Secondary | ICD-10-CM

## 2016-12-07 DIAGNOSIS — Z811 Family history of alcohol abuse and dependence: Secondary | ICD-10-CM | POA: Diagnosis not present

## 2016-12-07 DIAGNOSIS — F4323 Adjustment disorder with mixed anxiety and depressed mood: Secondary | ICD-10-CM

## 2016-12-07 DIAGNOSIS — F4322 Adjustment disorder with anxiety: Secondary | ICD-10-CM

## 2016-12-07 DIAGNOSIS — Z791 Long term (current) use of non-steroidal anti-inflammatories (NSAID): Secondary | ICD-10-CM

## 2016-12-07 MED ORDER — CLONAZEPAM 0.5 MG PO TABS: ORAL_TABLET | ORAL | 4 refills | Status: DC

## 2016-12-07 NOTE — Progress Notes (Signed)
[facility] MD Progress Note  12/07/2016 3:18 PM Kevin Hall  MRN:  [id number] Subjective:  No real change doing okay Principal Problem: Adjustment disorder with anxious mood state Today the patient is seen on time. Patient is in a lot of pain. He apparently strained his back. He's got a number of orthopedic problems at this time. Cares a great deal about his son. He recently saw an orthopedic surgeon who wanted to operate on his knee patient refused. Mainly refused because heis the only adult caregiver or his 78-year-old son. Son is now out of school and off on the summer. He feels like he needs to be around and care for him. He doesn't believe his elderly mother could probably handle her him by herself. The patient denies persistent daily depression. He describes a lot of anger and anxiety related to the fact that Social Security has denied him disability. Much or where he is going to go next. He comes to therapy on a regular basis here every month or so. I talked about twice a day of different programs for his son but he is very resistant. He says he'll take care of his son doesn't need help. Patient is never had any orthopedic surgery. I suspect the only evidence of a disability is his own complaints of pain. There may be some radiological evidence but apparently it's not all that significance. The patient generally is sleeping and eating well. He's enjoy playing the guitar. His mother recently bought an IT sales professional. Unfortunately his transportation recently broke down and can only take the bus. I'm not sure what is going to take him to relies the likely is not going to get any type of disability. Presently is on Medicaid which fortunately paces medical bills. I think would also pay for his knee surgery be chosen. This patient is not suicidal. He demonstrates no evidence of psychosis. . Total Time spent with patient: 15 minutes  Past Psychiatric History:   Past Medical History:  Past Medical History:   Diagnosis Date  . Achilles tendinitis   . Anxiety   . Back pain   . Depression   . Shoulder dislocation     Past Surgical History:  Procedure Laterality Date  . HERNIA REPAIR    . KNEE ARTHROSCOPY    . TONSILLECTOMY AND ADENOIDECTOMY     Family History:  Family History  Problem Relation Age of Onset  . Anxiety disorder Mother   . Depression Mother   . Diabetes Mother   . Hypertension Mother   . Thyroid disease Mother   . Cancer - Lung Father   . Alcohol abuse Father   . Cancer Father   . Bipolar disorder Brother   . Anxiety disorder Sister    Family Psychiatric  History:  Social History:  History  Alcohol Use No    Comment: a few beers every month      History  Drug Use No    Comment: Drug use twenty years ago     Social History   Social History  . Marital status: Divorced    Spouse name: N/A  . Number of children: N/A  . Years of education: N/A   Social History Main Topics  . Smoking status: Current Every Day Smoker    Packs/day: 1.00    Years: 31.00    Types: Cigarettes  . Smokeless tobacco: Never Used  . Alcohol use No     Comment: a few beers every month   . Drug use:  No     Comment: Drug use twenty years ago   . Sexual activity: Not Currently   Other Topics Concern  . None   Social History Narrative  . None   Additional Social History:                         Sleep: Good  Appetite:  Good  Current Medications: Current Outpatient Prescriptions  Medication Sig Dispense Refill  . acetaminophen-codeine (TYLENOL #3) 300-30 MG per tablet Take 1 tablet by mouth every 6 (six) hours as needed for moderate pain. 20 tablet 0  . amoxicillin-clavulanate (AUGMENTIN) 875-125 MG tablet Take 1 tablet by mouth 2 (two) times daily.    Marland Kitchen aspirin 325 MG tablet Take 325 mg by mouth daily.    Marland Kitchen atorvastatin (LIPITOR) 40 MG tablet Take 40 mg by mouth daily.  5  . ciprofloxacin (CIPRO) 500 MG tablet Take 1 tablet (500 mg total) by mouth 2 (two)  times daily. (Patient not taking: Reported on 02/08/2015) 14 tablet 0  . clonazePAM (KLONOPIN) 0.5 MG tablet 1  qday  1  prn 60 tablet 4  . cyclobenzaprine (FLEXERIL) 10 MG tablet Take 1 tablet (10 mg total) by mouth 3 (three) times daily as needed for muscle spasms. (Patient not taking: Reported on 02/08/2015) 30 tablet 0  . diclofenac sodium (VOLTAREN) 1 % GEL Apply 2 g topically 2 (two) times daily. Rub into affected area of foot 2 to 4 times daily (Patient not taking: Reported on 02/08/2015) 100 g 2  . Diclofenac Sodium 3 % GEL APPLY 2-3 GRAMS (1 GRAM=1 INCH) TO AFFECTED AREA 4 TIMES DAILY  5  . diphenhydrAMINE (BENADRYL) 25 mg capsule Take 25 mg by mouth every 6 (six) hours as needed for allergies.    . Doxepin HCl 5 % CREA apply 2 grams (2 grams=2 inches) to affected area(s) 3 times daily.  5  . gemfibrozil (LOPID) 600 MG tablet Take 600 mg by mouth 2 (two) times daily.  12  . HYDROcodone-acetaminophen (NORCO/VICODIN) 5-325 MG tablet Take 1 tablet by mouth every 4 (four) hours as needed. 10 tablet 0  . ibuprofen (ADVIL,MOTRIN) 400 MG tablet Take 400 mg by mouth 3 (three) times daily.  2  . ibuprofen (ADVIL,MOTRIN) 800 MG tablet Take 1 tablet (800 mg total) by mouth every 8 (eight) hours as needed. Takes 800mg  in the morning, and two tablets in the evening 30 tablet 0  . lidocaine (XYLOCAINE) 5 % ointment APPLY 2-3 (1 GRAM=1 INCH) GRAM TO AFFECTED AREA 4 TIMES A DAY  5  . meloxicam (MOBIC) 15 MG tablet Take 1 tablet (15 mg total) by mouth daily. (Patient not taking: Reported on 02/08/2015) 30 tablet 0  . methocarbamol (ROBAXIN) 500 MG tablet Take 1 tablet (500 mg total) by mouth 2 (two) times daily. 20 tablet 0  . metroNIDAZOLE (FLAGYL) 500 MG tablet Take 1 tablet (500 mg total) by mouth 2 (two) times daily. (Patient not taking: Reported on 02/08/2015) 14 tablet 0  . nitroGLYCERIN (NITRODUR - DOSED IN MG/24 HR) 0.2 mg/hr patch Place 0.2 mg onto the skin daily.    . predniSONE (STERAPRED UNI-PAK 21  TAB) 10 MG (21) TBPK tablet Take 1 tablet (10 mg total) by mouth daily. Take 6 tabs by mouth daily  for 2 days, then 5 tabs for 2 days, then 4 tabs for 2 days, then 3 tabs for 2 days, 2 tabs for 2 days, then 1 tab  by mouth daily for 2 days (Patient not taking: Reported on 07/18/2016) 42 tablet 0  . traMADol (ULTRAM) 50 MG tablet Take 1 tablet (50 mg total) by mouth every 6 (six) hours as needed. (Patient not taking: Reported on 02/08/2015) 15 tablet 0  . traMADol (ULTRAM) 50 MG tablet Take 1 tablet (50 mg total) by mouth every 8 (eight) hours as needed. (Patient not taking: Reported on 10/26/2015) 20 tablet 0  . VASCEPA 1 g CAPS TAKE 2 CAPSULES BY MOUTH TWICE A DAY FOR CHOLESTEROL  12   No current facility-administered medications for this visit.     Lab Results: No results found for this or any previous visit (from the past 48 hour(s)).  Blood Alcohol level:  No results found for: [facility]  Physical Findings: AIMS:  , ,  ,  ,    CIWA:    COWS:     Musculoskeletal: Strength & Muscle Tone: within normal limits Gait & Station: normal Patient leans: N/A  Psychiatric Specialty Exam: ROS  Blood pressure 106/74, height 5\' 9"  (1.753 m), weight 184 lb (83.5 kg).Body mass index is 27.17 kg/m.  General Appearance: Casual  Eye Contact::  Fair  Speech:  Clear and Coherent  Volume:  Normal  Mood:  Dysphoric  Affect:  Congruent  Thought Process:  Coherent  Orientation:  Full (Time, Place, and Person)  Thought Content:  WDL  Suicidal Thoughts:  No  Homicidal Thoughts:  No  Memory:  NA  Judgement:  Fair  Insight:  Fair  Psychomotor Activity:  Normal  Concentration:  Fair  Recall:  Good  Fund of Knowledge:Fair  Language: Good   Akathisia:  No  Handed:  Right  AIMS (if indicated):     Assets:  Communication Skills  ADL's:  Intact  Cognition: WNL  Sleep:      Treatment Plan Summary:  At this time the patient will continue taking Klonopin 0.5 mg twice a day. He also takes hydrocodone 3  times a day from his primary care doctor Dr. Selena BattenKim. The patient shows no evidence of abuse of anything. Today's and chronic significant pain. He barely sit still. He injured his back early this week. At this time the patient describes fears about taking Klonopin and hydrocodone. I shared with him that he takes very low-dose of dose of Klonopin. The patient is hesitant to take the full significant dose and I recommended that he do just that. Is on a very low dose as it is but he doesn't even take the dose I offered. Presently takes 0.5 mg twice a day but he takes about half of it. At this time the patient is near baseline. He has to deal with the summertime deal with his son being at home. Apparently for the patient is son is lots of energy and will take a lot to handle. Patient will be given a return appointment to see me in 3 months for 30 minutes.

## 2016-12-19 ENCOUNTER — Ambulatory Visit (HOSPITAL_COMMUNITY): Payer: Medicaid Other | Admitting: Psychiatry

## 2017-01-05 ENCOUNTER — Emergency Department (HOSPITAL_COMMUNITY)
Admission: EM | Admit: 2017-01-05 | Discharge: 2017-01-05 | Disposition: A | Discharge status: Home / Self Care | Payer: Medicaid Other | Attending: Emergency Medicine | Admitting: Emergency Medicine

## 2017-01-05 ENCOUNTER — Encounter (HOSPITAL_COMMUNITY): Payer: Self-pay | Admitting: Emergency Medicine

## 2017-01-05 DIAGNOSIS — M545 Low back pain, unspecified: Secondary | ICD-10-CM

## 2017-01-05 DIAGNOSIS — Y929 Unspecified place or not applicable: Secondary | ICD-10-CM | POA: Diagnosis not present

## 2017-01-05 DIAGNOSIS — T148XXA Other injury of unspecified body region, initial encounter: Secondary | ICD-10-CM

## 2017-01-05 DIAGNOSIS — X509XXA Other and unspecified overexertion or strenuous movements or postures, initial encounter: Secondary | ICD-10-CM | POA: Insufficient documentation

## 2017-01-05 DIAGNOSIS — Y939 Activity, unspecified: Secondary | ICD-10-CM | POA: Insufficient documentation

## 2017-01-05 DIAGNOSIS — Y999 Unspecified external cause status: Secondary | ICD-10-CM | POA: Insufficient documentation

## 2017-01-05 DIAGNOSIS — Z79899 Other long term (current) drug therapy: Secondary | ICD-10-CM | POA: Insufficient documentation

## 2017-01-05 DIAGNOSIS — S39012A Strain of muscle, fascia and tendon of lower back, initial encounter: Secondary | ICD-10-CM | POA: Diagnosis not present

## 2017-01-05 DIAGNOSIS — F17210 Nicotine dependence, cigarettes, uncomplicated: Secondary | ICD-10-CM | POA: Insufficient documentation

## 2017-01-05 DIAGNOSIS — Z7982 Long term (current) use of aspirin: Secondary | ICD-10-CM | POA: Diagnosis not present

## 2017-01-05 HISTORY — DX: Pure hypercholesterolemia, unspecified: E78.00

## 2017-01-05 MED ORDER — OXYCODONE-ACETAMINOPHEN 5-325 MG PO TABS: 1.0000 | ORAL_TABLET | ORAL | Status: DC | PRN

## 2017-01-05 MED ORDER — OXYCODONE-ACETAMINOPHEN 5-325 MG PO TABS
ORAL_TABLET | ORAL | Status: AC
  Administered 2017-01-05: 1
  Filled 2017-01-05: qty 1

## 2017-01-05 MED ORDER — DEXAMETHASONE SODIUM PHOSPHATE 10 MG/ML IJ SOLN
10.0000 mg | Freq: Once | INTRAMUSCULAR | Status: AC
  Administered 2017-01-05: 10 mg via INTRAMUSCULAR
  Filled 2017-01-05: qty 1

## 2017-01-05 MED ORDER — IBUPROFEN 600 MG PO TABS: 600.0000 mg | ORAL_TABLET | Freq: Four times a day (QID) | ORAL | 0 refills | Status: DC | PRN

## 2017-01-05 MED ORDER — TRAMADOL HCL 50 MG PO TABS: 50.0000 mg | ORAL_TABLET | Freq: Four times a day (QID) | ORAL | 0 refills | Status: DC | PRN

## 2017-01-05 NOTE — ED Triage Notes (Signed)
Pt presents with worsening chronic back pain, began 1 yr ago, worse x 1 month; pt reports RIGHT sided muscle tightness, denies muscle weakness, loss of bowel/bladder control; pt brought MRI results from 09/2015 and has been treating with meds

## 2017-01-05 NOTE — ED Provider Notes (Signed)
[facility]-EMERGENCY DEPT Provider Note   CSN: [id number] Arrival date & time: 01/05/17  1458  By signing my name below, I, Kevin Hall, attest that this documentation has been prepared under the direction and in the presence of Kevin Pili, PA-C. Electronically Signed: Freida Hall, Scribe. 01/05/2017. 3:53 PM.  History   Chief Complaint Chief Complaint  Patient presents with  . Back Pain    The history is provided by the patient. No language interpreter was used.    HPI Comments:  Kevin Hall is a 46 y.o. male who presents to the Emergency Department complaining of moderate lower back pain x 1 month. Pt states pain began after going from a squatting position to a standing position. He reports spasm sensation in the right lower back as well as radiation into the right hip. Pt has a h/o same pain and has been diagnosed with herniated discs and sciatica. No groin numbness or numbness in the extremities. No bowel/bladder incontinence. No alleviating factors noted. Pt states he has been unable to get a refill of his pain meds due to car issues.   Past Medical History:  Diagnosis Date  . Achilles tendinitis   . Anxiety   . Back pain   . Depression   . Hypercholesteremia   . Shoulder dislocation     Patient Active Problem List   Diagnosis Date Noted  . Acute pain of left knee 07/18/2016  . Generalized anxiety disorder 02/08/2016  . Achilles tendon disorder 10/14/2014  . Low back pain 01/09/2014  . HLD (hyperlipidemia) 01/09/2014  . Health care maintenance 12/29/2013  . Tobacco abuse 12/29/2013  . Sciatica 12/29/2013  . Adjustment disorder with mixed anxiety and depressed mood 11/06/2012  . 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 HERNIA REPAIR    . KNEE ARTHROSCOPY    . TONSILLECTOMY AND ADENOIDECTOMY         Home Medications    Prior to Admission medications   Medication Sig Start  Date End Date Taking? Authorizing Provider  acetaminophen-codeine (TYLENOL #3) 300-30 MG per tablet Take 1 tablet by mouth every 6 (six) hours as needed for moderate pain. 07/03/14   Vivi Barrack, DPM  amoxicillin-clavulanate (AUGMENTIN) 875-125 MG tablet Take 1 tablet by mouth 2 (two) times daily.    [provider]  aspirin 325 MG tablet Take 325 mg by mouth daily.    [provider]  atorvastatin (LIPITOR) 40 MG tablet Take 40 mg by mouth daily. 07/13/16   [provider]  ciprofloxacin (CIPRO) 500 MG tablet Take 1 tablet (500 mg total) by mouth 2 (two) times daily. Patient not taking: Reported on 02/08/2015 11/14/14   Harle Battiest, NP  clonazePAM Scarlette Calico) 0.5 MG tablet 1  qday  1  prn 12/07/16   Archer Asa, MD  cyclobenzaprine (FLEXERIL) 10 MG tablet Take 1 tablet (10 mg total) by mouth 3 (three) times daily as needed for muscle spasms. Patient not taking: Reported on 02/08/2015 02/18/14   Ivonne Andrew, PA-C  diclofenac sodium (VOLTAREN) 1 % GEL Apply 2 g topically 2 (two) times daily. Rub into affected area of foot 2 to 4 times daily Patient not taking: Reported on 02/08/2015 07/03/14   Vivi Barrack, DPM  Diclofenac Sodium 3 % GEL APPLY 2-3 GRAMS (1 GRAM=1 INCH) TO AFFECTED AREA 4 TIMES DAILY 07/04/16   [provider]  diphenhydrAMINE (BENADRYL) 25 mg capsule Take 25 mg by  mouth every 6 (six) hours as needed for allergies.    [provider]  Doxepin HCl 5 % CREA apply 2 grams (2 grams=2 inches) to affected area(s) 3 times daily. 07/04/16   [provider]  gemfibrozil (LOPID) 600 MG tablet Take 600 mg by mouth 2 (two) times daily. 07/13/16   [provider]  HYDROcodone-acetaminophen (NORCO/VICODIN) 5-325 MG tablet Take 1 tablet by mouth every 4 (four) hours as needed. 10/26/15   Jacalyn LefevreHaviland, Julie, MD  ibuprofen (ADVIL,MOTRIN) 400 MG tablet Take 400 mg by mouth 3 (three) times daily. 06/29/16   [provider]    ibuprofen (ADVIL,MOTRIN) 800 MG tablet Take 1 tablet (800 mg total) by mouth every 8 (eight) hours as needed. Takes 800mg  in the morning, and two tablets in the evening 05/15/14   Vivi BarrackWagoner, Matthew R, DPM  lidocaine (XYLOCAINE) 5 % ointment APPLY 2-3 (1 GRAM=1 INCH) GRAM TO AFFECTED AREA 4 TIMES A DAY 07/04/16   [provider]  meloxicam (MOBIC) 15 MG tablet Take 1 tablet (15 mg total) by mouth daily. Patient not taking: Reported on 02/08/2015 01/04/15   Muthersbaugh, Dahlia ClientHannah, PA-C  methocarbamol (ROBAXIN) 500 MG tablet Take 1 tablet (500 mg total) by mouth 2 (two) times daily. 01/04/15   Muthersbaugh, Dahlia ClientHannah, PA-C  metroNIDAZOLE (FLAGYL) 500 MG tablet Take 1 tablet (500 mg total) by mouth 2 (two) times daily. Patient not taking: Reported on 02/08/2015 11/14/14   Harle Battiestysinger, Elizabeth, NP  nitroGLYCERIN (NITRODUR - DOSED IN MG/24 HR) 0.2 mg/hr patch Place 0.2 mg onto the skin daily.    [provider]  predniSONE (STERAPRED UNI-PAK 21 TAB) 10 MG (21) TBPK tablet Take 1 tablet (10 mg total) by mouth daily. Take 6 tabs by mouth daily  for 2 days, then 5 tabs for 2 days, then 4 tabs for 2 days, then 3 tabs for 2 days, 2 tabs for 2 days, then 1 tab by mouth daily for 2 days Patient not taking: Reported on 07/18/2016 10/26/15   Jacalyn LefevreHaviland, Julie, MD  traMADol (ULTRAM) 50 MG tablet Take 1 tablet (50 mg total) by mouth every 6 (six) hours as needed. Patient not taking: Reported on 02/08/2015 11/14/14   Harle Battiestysinger, Elizabeth, NP  traMADol (ULTRAM) 50 MG tablet Take 1 tablet (50 mg total) by mouth every 8 (eight) hours as needed. Patient not taking: Reported on 10/26/2015 03/18/15   Almon HerculesGonfa, Taye T, MD  VASCEPA 1 g CAPS TAKE 2 CAPSULES BY MOUTH TWICE A DAY FOR CHOLESTEROL 07/13/16   [provider]    Family History Family History  Problem Relation Age of Onset  . Anxiety disorder Mother   . Depression Mother   . Diabetes Mother   . Hypertension Mother   . Thyroid disease Mother   . Cancer - Lung  Father   . Alcohol abuse Father   . Cancer Father   . Bipolar disorder Brother   . Anxiety disorder Sister     Social History Social History  Substance Use Topics  . Smoking status: Current Every Day Smoker    Packs/day: 1.00    Years: 31.00    Types: Cigarettes  . Smokeless tobacco: Never Used  . Alcohol use No     Comment: a few beers every month      Allergies   Patient has no known allergies.   Review of Systems Review of Systems  Musculoskeletal: Positive for back pain.  Neurological: Negative for weakness and numbness.     Physical Exam  Updated Vital Signs BP (!) 142/85 (BP Location: Left Arm)   Pulse 85   Temp 98.1 F (36.7 C) (Oral)   Resp 18   SpO2 98%   Physical Exam  Constitutional: He is oriented to person, place, and time. Vital signs are normal. He appears well-developed and well-nourished. No distress.  HENT:  Head: Normocephalic and atraumatic.  Right Ear: Hearing normal.  Left Ear: Hearing normal.  Eyes: Pupils are equal, round, and reactive to light. Conjunctivae and EOM are normal.  Cardiovascular: Normal rate and regular rhythm.   Pulmonary/Chest: Effort normal.  Abdominal: He exhibits no distension.  Musculoskeletal:  Right lower lumbar musculature; no mid spinous tenderness, no palpable step offs or deformity. Able to stand and ambulate without difficulty   Neurological: He is alert and oriented to person, place, and time.  Skin: Skin is warm and dry.  Psychiatric: He has a normal mood and affect. His speech is normal and behavior is normal. Thought content normal.  Nursing note and vitals reviewed.  ED Treatments / Results  DIAGNOSTIC STUDIES:  Oxygen Saturation is 98% on RA, normal by my interpretation.    COORDINATION OF CARE:  3:53 PM Discussed treatment plan with pt at bedside and pt agreed to plan.  Labs (all labs ordered are listed, but only abnormal results are displayed) Labs Reviewed - No data to display  EKG  EKG  Interpretation None       Radiology No results found.  Procedures Procedures (including critical care time)  Medications Ordered in ED Medications  oxyCODONE-acetaminophen (PERCOCET/ROXICET) 5-325 MG per tablet 1 tablet (not administered)  oxyCODONE-acetaminophen (PERCOCET/ROXICET) 5-325 MG per tablet (1 tablet  Given 01/05/17 1526)     Initial Impression / Assessment and Plan / ED Course  I have reviewed the triage vital signs and the nursing notes.  Pertinent labs & imaging results that were available during my care of the patient were reviewed by me and considered in my medical decision making (see chart for details).     {I have reviewed the relevant previous healthcare records.  {I obtained HPI from historian.   ED Course:  Assessment: Patient is a 46 y.o. male who presents to the ED with back pain. Hx same. X 1 month ago. No neurological deficits appreciated. Patient is ambulatory. No warning symptoms of back pain including: fecal incontinence, urinary retention or overflow incontinence, night sweats, waking from sleep with back pain, unexplained fevers or weight loss, h/o cancer, IVDU, recent trauma. No concern for cauda equina, epidural abscess, or other serious cause of back pain. Conservative measures such as rest, ice/heat and pain medicine indicated with PCP follow-up if no improvement with conservative management.   Disposition/Plan:  DC Home Additional Verbal discharge instructions given and discussed with patient.  Pt Instructed to f/u with PCP in the next week for evaluation and treatment of symptoms. Return precautions given Pt acknowledges and agrees with plan  Supervising Physician Arby Barrette, MD   Final Clinical Impressions(s) / ED Diagnoses   Final diagnoses:  Acute right-sided low back pain without sciatica  Muscle strain    New Prescriptions New Prescriptions   No medications on file   I personally performed the services described in this  documentation, which was scribed in my presence. The recorded information has been reviewed and is accurate.    Kevin Pili, PA-C 01/05/17 1607    Arby Barrette, MD 01/11/17 [phone]

## 2017-01-05 NOTE — Discharge Instructions (Signed)
Please read and follow all provided instructions.  Your diagnoses today include:  1. Acute right-sided low back pain without sciatica   2. Muscle strain     Tests performed today include: Vital signs - see below for your results today  Medications prescribed:   Take any prescribed medications only as directed.  Home care instructions:  Follow any educational materials contained in this packet Please rest, use ice or heat on your back for the next several days Do not lift, push, pull anything more than 10 pounds for the next week  Follow-up instructions: Please follow-up with your primary care provider in the next 1 week for further evaluation of your symptoms.   Return instructions:  SEEK IMMEDIATE MEDICAL ATTENTION IF YOU HAVE: New numbness, tingling, weakness, or problem with the use of your arms or legs Severe back pain not relieved with medications Loss control of your bowels or bladder Increasing pain in any areas of the body (such as chest or abdominal pain) Shortness of breath, dizziness, or fainting.  Worsening nausea (feeling sick to your stomach), vomiting, fever, or sweats Any other emergent concerns regarding your health   Additional Information:  Your vital signs today were: BP (!) 142/85 (BP Location: Left Arm)    Pulse 85    Temp 98.1 F (36.7 C) (Oral)    Resp 18    SpO2 98%  If your blood pressure (BP) was elevated above 135/85 this visit, please have this repeated by your doctor within one month. --------------

## 2017-01-22 ENCOUNTER — Ambulatory Visit (INDEPENDENT_AMBULATORY_CARE_PROVIDER_SITE_OTHER): Payer: Medicaid Other | Admitting: Psychiatry

## 2017-01-22 DIAGNOSIS — F4323 Adjustment disorder with mixed anxiety and depressed mood: Secondary | ICD-10-CM

## 2017-01-25 NOTE — Progress Notes (Signed)
   THERAPIST PROGRESS NOTE  Session Time: 1:10-2:00  Participation Level:Active   Behavioral Response:Well GroomedAlertAnxious  Type of Therapy: Individual Therapy   Treatment Goals addressed: Anxiety and Coping   Interventions:Solution Focused, Strength-based, Supportive and Reframing   Summary: Pt. Continues to present as agitated, defensive. Pt. Indicated that his pain had been manageable recently and that swimming regularly was helping him to reduce his pain level. Pt. Reports that his most significant stressor at this time is ongoing hearings with the social security administration. Pt. Discussed challenge of parenting his son and difficulty of setting age appropriate boundaries with his son. Significant part session was spent on parent education regarding age appropriate boundaries, range of emotional expression and how to encouraged emotional language with his son, the importance of active play and sleep for his child's development. Pt. Continues to express fear of his son being hurt by other children during play. This behavior was normalized by the therapist and Pt. Was encouraged to allow his son the opportunity for socialization with other children and to allow him the opportunity to openly express his emotions during play and while processing after with him.    Suicidal/Homicidal:Nowithout intent/plan   Therapist Response: Assessed patients current functioning and reviewed progress. Reviewed coping strategies. Assessed patients safety and assisted in identifying protective factors. Assisted patient with the expression of anxiety and frustration.  Plan: Pt. To continue with person-centered and strength based therapy.Return again in 4-6 weeks.   Diagnosis: Axis I:Adjustment Disorder with Mixed Emotional Features    [name], Dajae Kizer B, [facility] 01/25/2017

## 2017-02-02 ENCOUNTER — Ambulatory Visit (HOSPITAL_COMMUNITY): Payer: Medicaid Other | Admitting: Psychiatry

## 2017-02-13 ENCOUNTER — Ambulatory Visit (INDEPENDENT_AMBULATORY_CARE_PROVIDER_SITE_OTHER): Payer: Medicaid Other | Admitting: Psychiatry

## 2017-02-13 DIAGNOSIS — F4323 Adjustment disorder with mixed anxiety and depressed mood: Secondary | ICD-10-CM | POA: Diagnosis not present

## 2017-02-16 NOTE — Progress Notes (Signed)
   THERAPIST PROGRESS NOTE  Session Time: 12:10-1:00  Participation Level:Active   Behavioral Response:Well GroomedAlertAnxious  Type of Therapy: Individual Therapy   Treatment Goals addressed: Anxiety and Coping   Interventions:Solution Focused, Strength-based, Supportive and Reframing   Summary: Pt. Continues to present as agitated, defensive. Pt. Discussed that he continues to experience significant foot pain. Pt. Reported that because of his pain he plans to go the the ER immediately following today's session. Pt. Discussed his anger towards his mother and his brother because they have not repaired the family car and have not been able to assist with his transportation which requires him to use public transportation. Counselor attempted to help Pt. To identify a thought that his mother and brother are intentionally trying to make his life hard by withholding repair of the car. Pt. Is deeply focused on himself and his pain and finds it difficult to see other perspectives on this issue. Pt. Was asked what would be most helpful for him in his life at this time and he said to see progress in his pain level which he hopes to pursue with surgery and through settlement of this disability claim. Pt. Does not see much utility in focusing on this thoughts, feelings, and behaviors are contributing to his pain and dissatisfaction in his life, so counselor discussed that we would need to terminate. Pt. Seemed reluctant to termination because he wants to be compliant with what was recommended for hi by his psychiatrist, but indicated that he would not reschedule another appointment at this time.  Suicidal/Homicidal:Nowithout intent/plan   Therapist Response: Assessed patients current functioning and reviewed progress. Reviewed coping strategies. Assessed patients safety and assisted in identifying protective factors. Assisted patient with the expression of anxiety and frustration.  Plan: Pt.  To continue with person-centered and strength based therapy.   Diagnosis: Axis I:Mixed Mood Disorder    [name], [facility] 02/16/2017

## 2017-03-08 ENCOUNTER — Ambulatory Visit (HOSPITAL_COMMUNITY): Payer: Self-pay | Admitting: Psychiatry

## 2017-04-03 ENCOUNTER — Ambulatory Visit (INDEPENDENT_AMBULATORY_CARE_PROVIDER_SITE_OTHER): Payer: Medicaid Other | Admitting: Orthopaedic Surgery

## 2017-04-03 ENCOUNTER — Encounter (INDEPENDENT_AMBULATORY_CARE_PROVIDER_SITE_OTHER): Payer: Self-pay | Admitting: Orthopaedic Surgery

## 2017-04-03 DIAGNOSIS — M25562 Pain in left knee: Secondary | ICD-10-CM | POA: Diagnosis not present

## 2017-04-03 DIAGNOSIS — G8929 Other chronic pain: Secondary | ICD-10-CM | POA: Diagnosis not present

## 2017-04-03 MED ORDER — BUPIVACAINE HCL 0.5 % IJ SOLN
2.0000 mL | INTRAMUSCULAR | Status: AC | PRN
  Administered 2017-04-03: 2 mL via INTRA_ARTICULAR

## 2017-04-03 MED ORDER — METHYLPREDNISOLONE ACETATE 40 MG/ML IJ SUSP
40.0000 mg | INTRAMUSCULAR | Status: AC | PRN
  Administered 2017-04-03: 40 mg via INTRA_ARTICULAR

## 2017-04-03 MED ORDER — LIDOCAINE HCL 1 % IJ SOLN
2.0000 mL | INTRAMUSCULAR | Status: AC | PRN
  Administered 2017-04-03: 2 mL

## 2017-04-03 NOTE — Progress Notes (Signed)
Office Visit Note   Patient: Kevin Hall           Date of Birth: 05/31/1971           MRN: [id number] Visit Date: 04/03/2017              Requested by: Almon Hercules, MD [address] [place], [place] [address] PCP: [facility]   Assessment & Plan: Visit Diagnoses:  1. Chronic pain of left knee     Plan: Left knee cortisone injection performed today. Continue wear the knee brace as needed. Questions encouraged and answered. Follow-up as needed.  Follow-Up Instructions: Return if symptoms worsen or fail to improve.   Orders:  No orders of the defined types were placed in this encounter.  No orders of the defined types were placed in this encounter.     Procedures: Large Joint Inj Date/Time: 04/03/2017 1:04 PM Performed by: Tarry Kos Authorized by: Tarry Kos   Consent Given by:  Patient Timeout: prior to procedure the correct patient, procedure, and site was verified   Indications:  Pain Location:  Knee Site:  L knee Prep: patient was prepped and draped in usual sterile fashion   Needle Size:  22 G Ultrasound Guidance: No   Fluoroscopic Guidance: No   Arthrogram: No   Medications:  2 mL lidocaine 1 %; 2 mL bupivacaine 0.5 %; 40 mg methylPREDNISolone acetate 40 MG/ML Patient tolerance:  Patient tolerated the procedure well with no immediate complications     Clinical Data: No additional findings.   Subjective: Chief Complaint  Patient presents with  . Left Knee - Pain    Patient follows up today for continued left knee pain. She is requesting another knee injection. He had a previous one in January which did help. He is supposed to be undergoing knee arthroscopy with lateral release with Dr. Magnus Ivan soon.    Review of Systems  Constitutional: Negative.   All other systems reviewed and are negative.    Objective: Vital Signs: There were no vitals taken for this visit.  Physical Exam  Constitutional: He is oriented to  person, place, and time. He appears well-developed and well-nourished.  Pulmonary/Chest: Effort normal.  Abdominal: Soft.  Neurological: He is alert and oriented to person, place, and time.  Skin: Skin is warm.  Psychiatric: He has a normal mood and affect. His behavior is normal. Judgment and thought content normal.  Nursing note and vitals reviewed.   Ortho Exam Left knee exam shows no joint effusion. Laterally tracking patella. Specialty Comments:  No specialty comments available.  Imaging: No results found.   PMFS History: Patient Active Problem List   Diagnosis Date Noted  . Acute pain of left knee 07/18/2016  . Generalized anxiety disorder 02/08/2016  . Achilles tendon disorder 10/14/2014  . Low back pain 01/09/2014  . HLD (hyperlipidemia) 01/09/2014  . Health care maintenance 12/29/2013  . Tobacco abuse 12/29/2013  . Sciatica 12/29/2013  . Adjustment disorder with mixed anxiety and depressed mood 11/06/2012  . 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 Achilles tendinitis   . Anxiety   . Back pain   . Depression   . Hypercholesteremia   . Shoulder dislocation     Family History  Problem Relation Age of Onset  . Anxiety disorder Mother   . Depression Mother   . Diabetes Mother   . Hypertension Mother   .  Thyroid disease Mother   . Cancer - Lung Father   . Alcohol abuse Father   . Cancer Father   . Bipolar disorder Brother   . Anxiety disorder Sister     Past Surgical History:  Procedure Laterality Date  . HERNIA REPAIR    . KNEE ARTHROSCOPY    . TONSILLECTOMY AND ADENOIDECTOMY     Social History   Occupational History  . Not on file.   Social History Main Topics  . Smoking status: Current Every Day Smoker    Packs/day: 1.00    Years: 31.00    Types: Cigarettes  . Smokeless tobacco: Never Used  . Alcohol use No     Comment: a few beers every month   . Drug use: No      Comment: Drug use twenty years ago   . Sexual activity: Not Currently

## 2017-05-25 ENCOUNTER — Ambulatory Visit (INDEPENDENT_AMBULATORY_CARE_PROVIDER_SITE_OTHER): Payer: Medicaid Other | Admitting: Psychiatry

## 2017-05-25 ENCOUNTER — Encounter (HOSPITAL_COMMUNITY): Payer: Self-pay | Admitting: Psychiatry

## 2017-05-25 VITALS — BP 114/82 | HR 100 | Ht 68.75 in | Wt 192.0 lb

## 2017-05-25 DIAGNOSIS — Z811 Family history of alcohol abuse and dependence: Secondary | ICD-10-CM

## 2017-05-25 DIAGNOSIS — Z818 Family history of other mental and behavioral disorders: Secondary | ICD-10-CM

## 2017-05-25 DIAGNOSIS — Z79899 Other long term (current) drug therapy: Secondary | ICD-10-CM | POA: Diagnosis not present

## 2017-05-25 DIAGNOSIS — F4322 Adjustment disorder with anxiety: Secondary | ICD-10-CM

## 2017-05-25 DIAGNOSIS — F17210 Nicotine dependence, cigarettes, uncomplicated: Secondary | ICD-10-CM

## 2017-05-25 DIAGNOSIS — F4323 Adjustment disorder with mixed anxiety and depressed mood: Secondary | ICD-10-CM

## 2017-05-25 MED ORDER — CLONAZEPAM 0.5 MG PO TABS: ORAL_TABLET | ORAL | 4 refills | Status: DC

## 2017-05-25 NOTE — Progress Notes (Signed)
[facility]  05/25/2017 10:53 AM Kevin SizerJoshua Mogg  MRN:  [id number] Subjective:  No real change doing okay Principal Problem: Adjustment disorder with anxious mood state Today the patient is at his baseline. He continues to have great problems with his left leg is unable to drive. He is dependent upon everything. His 47-year-old son is being evaluated here for probably attention deficit the patient actually is sleeping and eating well. He uses no alcohol he does not use any drugs. He no longer sees [facility] therapy but it always available for her. The patient is chronically frustrated by the fact that he cannot get disability. The Patient Is Not Suicidal. Got Good Energy Is No Evidence Psychosis His Mother Is stable. The patient is surviving as best he can. He takes Klonopin 0.5 mg 1 every day with 1 extra if needed. He continues on hydrocodone for pain. . Total Time spent with patient: 15 minutes  Past Psychiatric History:   Past Medical History:  Past Medical History:  Diagnosis Date  . Achilles tendinitis   . Anxiety   . Back pain   . Depression   . Hypercholesteremia   . Shoulder dislocation     Past Surgical History:  Procedure Laterality Date  . HERNIA REPAIR    . KNEE ARTHROSCOPY    . TONSILLECTOMY AND ADENOIDECTOMY     Family History:  Family History  Problem Relation Age of Onset  . Anxiety disorder Mother   . Depression Mother   . Diabetes Mother   . Hypertension Mother   . Thyroid disease Mother   . Cancer - Lung Father   . Alcohol abuse Father   . Cancer Father   . Bipolar disorder Brother   . Anxiety disorder Sister    Family Psychiatric  History:  Social History:  Social History   Substance and Sexual Activity  Alcohol Use No   Comment: a few beers every month      Social History   Substance and Sexual Activity  Drug Use No   Comment: Drug use twenty years ago     Social History   Socioeconomic History  . Marital status: Divorced   Spouse name: None  . Number of children: None  . Years of education: None  . Highest education level: None  Social Needs  . Financial resource strain: None  . Food insecurity - worry: None  . Food insecurity - inability: None  . Transportation needs - medical: None  . Transportation needs - non-medical: None  Occupational History  . None  Tobacco Use  . Smoking status: Current Every Day Smoker    Packs/day: 1.00    Years: 31.00    Pack years: 31.00    Types: Cigarettes  . Smokeless tobacco: Never Used  Substance and Sexual Activity  . Alcohol use: No    Comment: a few beers every month   . Drug use: No    Comment: Drug use twenty years ago   . Sexual activity: Not Currently  Other Topics Concern  . None  Social History Narrative  . None   Additional Social History:                         Sleep: Good  Appetite:  Good  Current Medications: Current Outpatient Medications  Medication Sig Dispense Refill  . atorvastatin (LIPITOR) 40 MG tablet Take 40 mg by mouth daily.  5  . clonazePAM (KLONOPIN) 0.5 MG tablet  1  qday  1  prn 60 tablet 4  . cyclobenzaprine (FLEXERIL) 10 MG tablet Take 1 tablet (10 mg total) by mouth 3 (three) times daily as needed for muscle spasms. 30 tablet 0  . gabapentin (NEURONTIN) 400 MG capsule TAKE ONE CAPSULE 3 TIMES A DAY  5  . HYDROcodone-acetaminophen (NORCO/VICODIN) 5-325 MG tablet Take 1 tablet by mouth every 4 (four) hours as needed. 10 tablet 0  . ibuprofen (ADVIL,MOTRIN) 600 MG tablet Take 1 tablet (600 mg total) by mouth every 6 (six) hours as needed. 30 tablet 0  . ranitidine (RANITIDINE 150 MAX STRENGTH) 150 MG tablet Take 150 mg by mouth 2 (two) times daily.     No current facility-administered medications for this visit.     Lab Results: No results found for this or any previous visit (from the past 48 hour(s)).  Blood Alcohol level:  No results found for: [facility]  Physical Findings: AIMS:  , ,  ,  ,    CIWA:    COWS:      Musculoskeletal: Strength & Muscle Tone: within normal limits Gait & Station: normal Patient leans: N/A  Psychiatric Specialty Exam: ROS  Blood pressure 114/82, pulse 100, height 5' 8.75" (1.746 m), weight 192 lb (87.1 kg).Body mass index is 28.56 kg/m.  General Appearance: Casual  Eye Contact::  Fair  Speech:  Clear and Coherent  Volume:  Normal  Mood:  Dysphoric  Affect:  Congruent  Thought Process:  Coherent  Orientation:  Full (Time, Place, and Person)  Thought Content:  WDL  Suicidal Thoughts:  No  Homicidal Thoughts:  No  Memory:  NA  Judgement:  Fair  Insight:  Fair  Psychomotor Activity:  Normal  Concentration:  Fair  Recall:  Good  Fund of Knowledge:Fair  Language: Good   Akathisia:  No  Handed:  Right  AIMS (if indicated):     Assets:  Communication Skills  ADL's:  Intact  Cognition: WNL  Sleep:      Treatment Plan Summary:  t this time the patient will continue taking Klonopin 0.5 mg once in the morning and one extra. The patient is looking for to the holidays. He's really look for to on the operating room worthless. Cognitively he is quite stable. Is not impaired cognitively. He is chronic anxiety directly related to circumstances. The dynamic issues that his fact this is getting older. In some ways is really good for the patient is to become more and more independent and also the patient will be able to knee surgery and get back to work force. For now continue his medications and return to see me in 5 months.

## 2017-06-19 IMAGING — MR MR KNEE*L* W/O CM
4 of 6 series · 18 of 40 positions shown · non-contrast
Comparison: 06/26/2016 radiographs

CLINICAL DATA: Prior patellar dislocations.  Pain in the knee.

EXAM:
MRI OF THE LEFT KNEE WITHOUT CONTRAST
TECHNIQUE: Multiplanar, multisequence MR imaging of the knee was performed. No
intravenous contrast was administered.

[Series 3: PD fat-sat · axial · 4.0mm · 0.31mm/px · z∈[-67,+48]mm · 8 of 25 slices shown (1 of 4)]
[im 1/25]
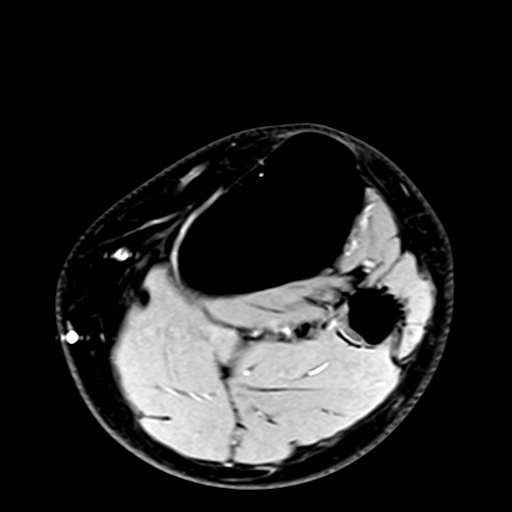
[im 4/25]
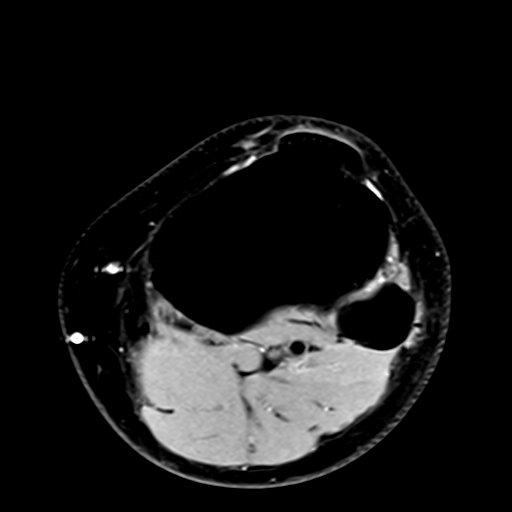
[im 7/25]
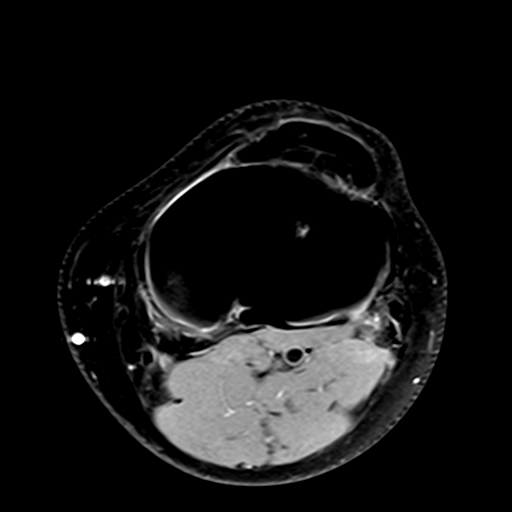
[im 11/25]
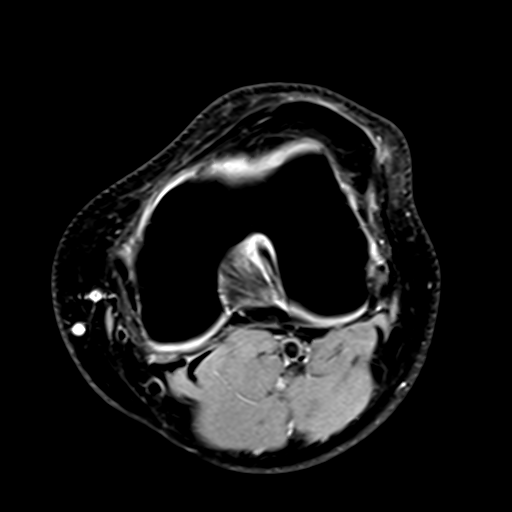
[im 14/25]
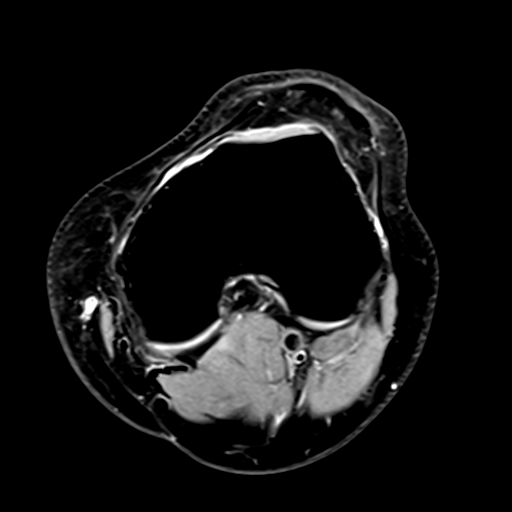
[im 18/25]
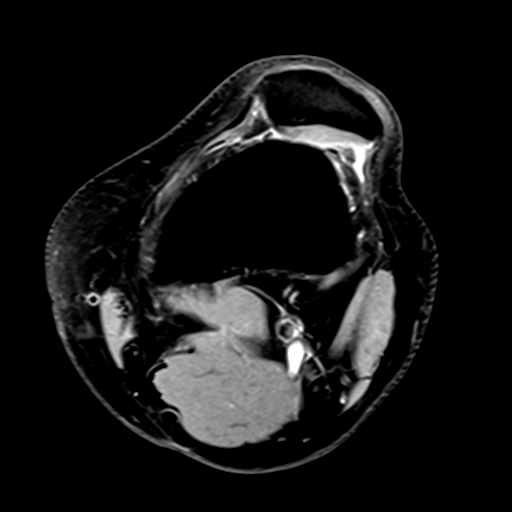
[im 21/25]
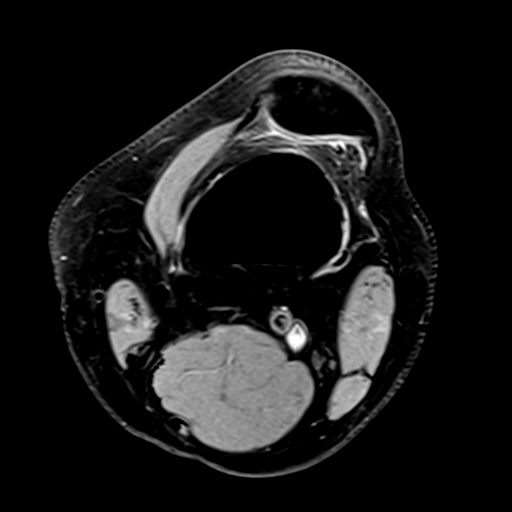
[im 25/25]
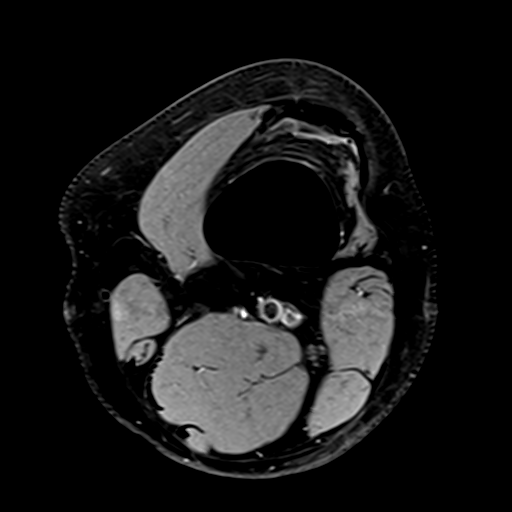

[Series 4: PD fat-sat · coronal · 3.5mm · 0.29mm/px · 4 of 26 slices shown (2 of 4)]
[im 1/26]
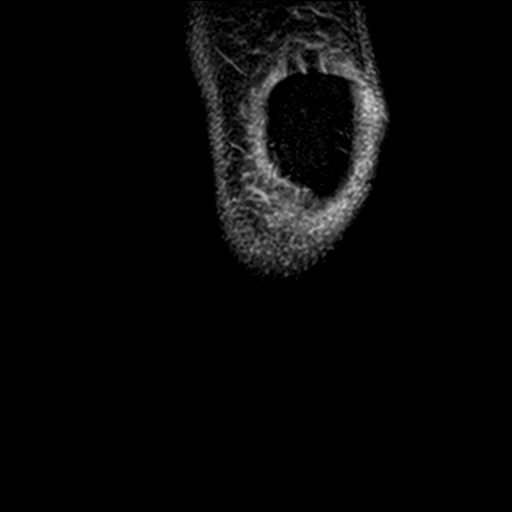
[im 5/26]
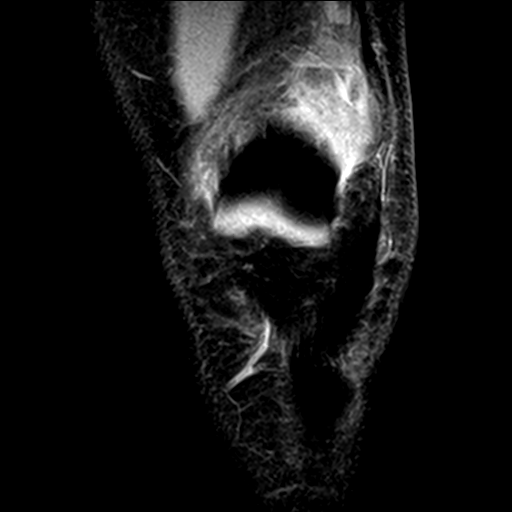
[im 13/26]
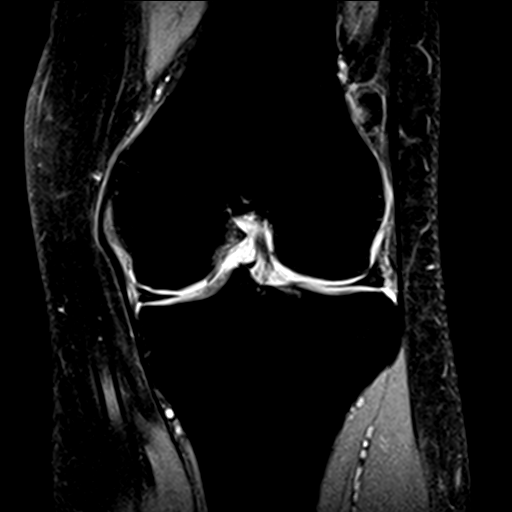
[im 21/26]
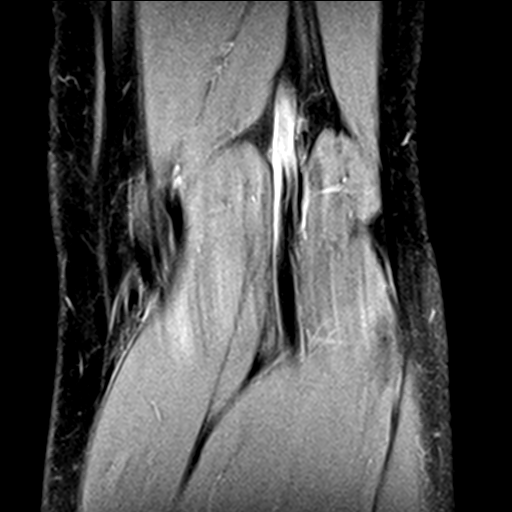

[Series 6: PD fat-sat · sagittal · 3.2mm · 0.29mm/px · 3 of 24 slices shown (3 of 4)]
[im 4/24]
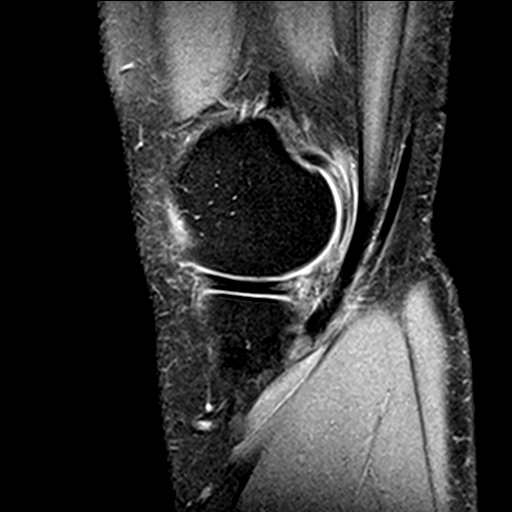
[im 12/24]
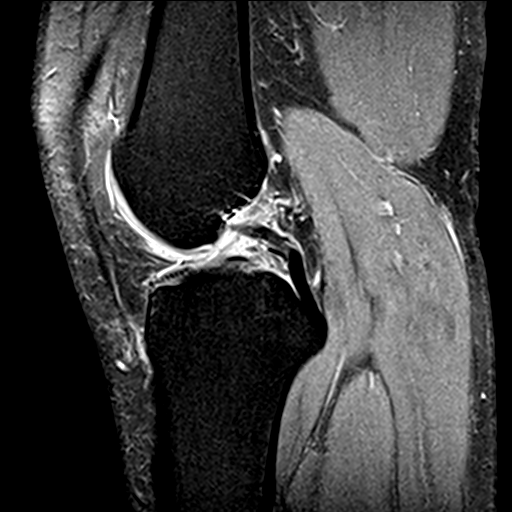
[im 20/24]
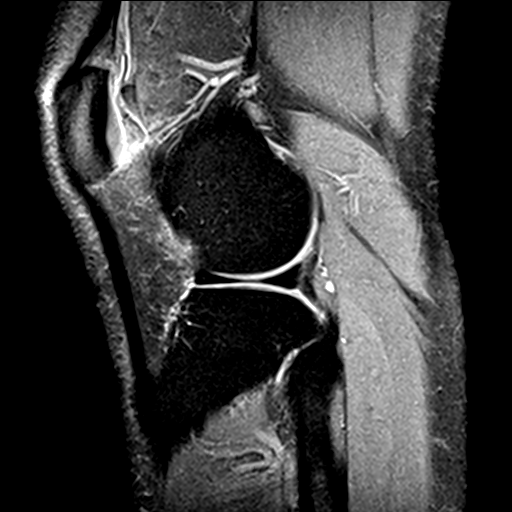

[Series 8: PD fat-sat · coronal · 2.0mm · 0.29mm/px · 3 of 15 slices shown (4 of 4)]
[im 1/15]
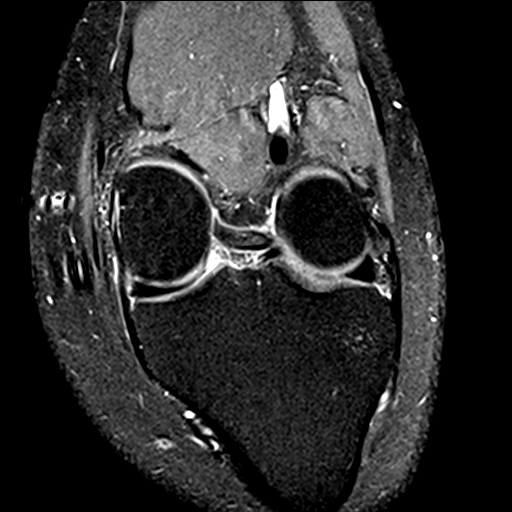
[im 10/15]
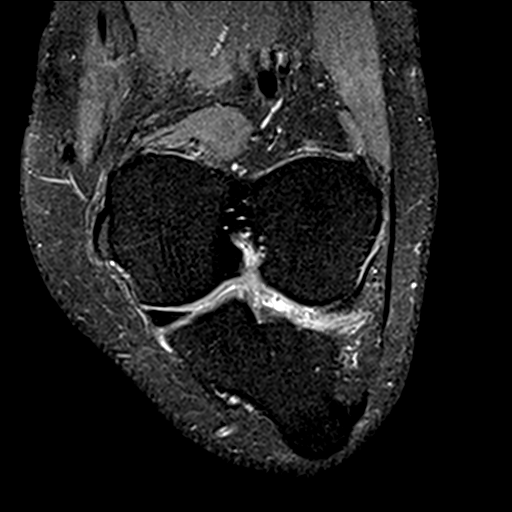
[im 15/15]
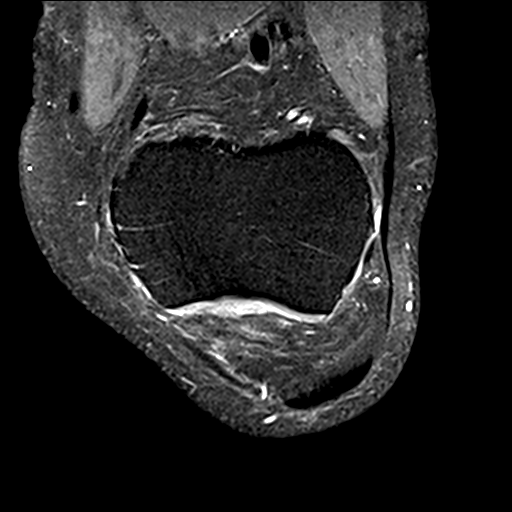

[18 of 40 positions shown; findings below may reference images not displayed]

FINDINGS: MENISCI

Medial meniscus:  Unremarkable

Lateral meniscus: Peripheral oblique tear of the posterior horn
lateral meniscus involving the inferior meniscal surface and
possibly extending into the inferior popliteomeniscal fascicle,
images 5-8 series 6.

LIGAMENTS

Cruciates:  Unremarkable

Collaterals:  Unremarkable

CARTILAGE

Patellofemoral: Chondral thinning along the femoral trochlear groove
and medial patellar facet. Small degenerative subcortical focus of
marrow edema inferiorly along the posterior patellar ridge, image
[DATE].

Medial: Mild degenerative chondral thinning with minimal marginal
spurring.

Lateral:  Mild degenerative chondral thinning.

Joint: Focal synovitis laterally in the patellofemoral joint with a
1.0 by 0.7 by 0.6 cm low signal intensity structure potentially a
small focus of pigmented villonodular synovitis. This has low T1 and
T2 signal intensity. I am skeptical that this is a free
osteochondral fragment. Thin medial plica

Popliteal Fossa:  Unremarkable

Extensor Mechanism: Shallow and almost anteriorly convex femoral
trochlear groove especially superiorly, with laterally subluxed
patella, predisposing to femoral maltracking and dislocation. Mildly
accentuated signal in the medial patellar retinaculum inferiorly
near the patellar border on images 10-11 series 3. No obvious tear
of the medial patellofemoral ligament.

Bones: No significant extra-articular osseous abnormalities
identified.

Other: No supplemental non-categorized findings.
IMPRESSION: 1. Small peripheral oblique tear of the posterior horn lateral
meniscus involving the inferior meniscal surface and possibly
extending to the inferior popliteomeniscal fascicle.
2. Shallow an almost anteriorly convex femoral trochlear groove
predisposing the maltracking and dislocation. Laterally subluxed
patella.
3. Mild degenerative chondral thinning. Potential sprain of the
medial patellar retinaculum inferiorly.
4. Focal synovitis laterally in the patellofemoral joint with low
signal intensity, cannot exclude a small focus of PVNS.
5.

## 2017-07-10 HISTORY — PX: KNEE ARTHROSCOPY: SUR90

## 2017-07-17 ENCOUNTER — Ambulatory Visit: Payer: Medicaid Other | Attending: Specialist | Admitting: Physical Therapy

## 2017-07-17 ENCOUNTER — Other Ambulatory Visit: Payer: Self-pay

## 2017-07-17 ENCOUNTER — Encounter: Payer: Self-pay | Admitting: Physical Therapy

## 2017-07-17 DIAGNOSIS — R600 Localized edema: Secondary | ICD-10-CM | POA: Diagnosis present

## 2017-07-17 DIAGNOSIS — R2689 Other abnormalities of gait and mobility: Secondary | ICD-10-CM | POA: Diagnosis present

## 2017-07-17 DIAGNOSIS — M6281 Muscle weakness (generalized): Secondary | ICD-10-CM | POA: Diagnosis present

## 2017-07-17 DIAGNOSIS — M25662 Stiffness of left knee, not elsewhere classified: Secondary | ICD-10-CM | POA: Insufficient documentation

## 2017-07-17 DIAGNOSIS — M25562 Pain in left knee: Secondary | ICD-10-CM | POA: Diagnosis not present

## 2017-07-17 NOTE — Patient Instructions (Addendum)
Strengthening: Straight Leg Raise (Phase 1)    Tighten muscles on front of right thigh, then lift leg _3___ inches from surface, keeping knee locked.  Repeat _5___ times per set. Do _1___ sets per session. Do __3__ sessions per day.  http://orth.exer.us/614   Copyright  VHI. All rights reserved.   Strengthening: Terminal Knee Extension (Supine)    With right knee over bolster, straighten knee by tightening muscles on top of thigh. Keep bottom of knee on bolster.3 Repeat _5___ times per set. Do _1___ sets per session. Do ____ sessions per day.  http://orth.exer.us/626   Copyright  VHI. All rights reserved.  ANKLE: Pumps    Point toes down, then up. _10__ reps per set, _1__ sets per day, __3_ days per week   Copyright  VHI. All rights reserved.  Strengthening: Hip Abduction (Side-Lying)    Tighten muscles on front of left thigh, then lift leg __3__ inches from surface, keeping knee locked.  Repeat __5__ times per set. Do ___1_ sets per session. Do ___3_ sessions per day.  http://orth.exer.us/622   Copyright  VHI. All rights reserved.  Strengthening: Hip Adduction - Isometric    With ball or folded pillow between knees, squeeze knees together. Hold __5__ seconds. Repeat ___5_ times per set. Do _1__ sets per session. Do 3____ sessions per day.  http://orth.exer.us/612   Copyright  VHI. All rights reserved.  [facility] [address], [address] [place], [place] [address] Phone # [phone] Fax [phone]

## 2017-07-17 NOTE — Therapy (Signed)
[facility] [address], [address] [place], [place], [address] Phone: [phone]   Fax:  [phone]  Physical Therapy Evaluation  Patient Details  Name: Kevin Hall MRN: [id number] Date of Birth: 27-Sep-1970 Referring Provider: Arsenio Loader, PA-C   Encounter Date: 07/17/2017  PT End of Session - 07/17/17 1248    Visit Number  1    Date for PT Re-Evaluation  08/14/17    Authorization Type  Medicaid    PT Start Time  0930    PT Stop Time  1013    PT Time Calculation (min)  43 min    Activity Tolerance  Patient tolerated treatment well    Behavior During Therapy  [facility] for tasks assessed/performed       Past Medical History:  Diagnosis Date  . Achilles tendinitis   . Anxiety   . Back pain   . Depression   . Hypercholesteremia   . Shoulder dislocation     Past Surgical History:  Procedure Laterality Date  . HERNIA REPAIR    . KNEE ARTHROSCOPY    . KNEE ARTHROSCOPY Left 07/10/2017  . TONSILLECTOMY AND ADENOIDECTOMY      There were no vitals filed for this visit.   Subjective Assessment - 07/17/17 0941    Subjective  Patient had arthroscopic surgery on 07/10/2017.  Patient is using the crutches.     Patient Stated Goals  walk without knee brace,     Currently in Pain?  Yes    Pain Score  7     Pain Location  Knee    Pain Orientation  Left    Pain Descriptors / Indicators  Nagging;Constant    Pain Onset  In the past 7 days    Pain Frequency  Constant    Aggravating Factors   walking, weightbearing activities    Pain Relieving Factors  being off his feet, ice    Multiple Pain Sites  No         [facility] PT Assessment - 07/17/17 0001      Assessment   Medical Diagnosis  Z47.89 Enconunter for other orthopedic aftercare    Referring Provider  Arsenio Loader, PA-C    Onset Date/Surgical Date  07/10/17    Prior Therapy  None      Precautions   Precautions  None      Restrictions   Weight Bearing  Restrictions  No      Balance Screen   Has the patient fallen in the past 6 months  No    Has the patient had a decrease in activity level because of a fear of falling?   No    Is the patient reluctant to leave their home because of a fear of falling?   No      Home Public house manager residence    Living Arrangements  Other (Comment)    Type of Home  Apartment    Home Access  Level entry      Prior Function   Level of Independence  Independent      Cognition   Overall Cognitive Status  Within Functional Limits for tasks assessed      Observation/Other Assessments   Skin Integrity  surgical side is healing with stitches    Focus on Therapeutic Outcomes (FOTO)   patient is 60% limitation due to walking with crutches and other orthopedic problems.       Observation/Other Assessments-Edema    Edema  Circumferential 39.5 cm left      Posture/Postural Control   Posture/Postural Control  No significant limitations      ROM / Strength   AROM / PROM / Strength  AROM;PROM;Strength      AROM   AROM Assessment Site  Knee    Right/Left Knee  Left    Left Knee Extension  -4 sitting    Left Knee Flexion  95      PROM   PROM Assessment Site  Knee    Right/Left Knee  Right;Left    Left Knee Extension  0    Left Knee Flexion  105      Strength   Strength Assessment Site  Knee    Left Hip Flexion  2/5    Left Hip Extension  2/5    Left Hip ABduction  2/5    Left Hip ADduction  2/5    Right/Left Knee  Left    Left Knee Flexion  2/5    Left Knee Extension  2/5      Palpation   Patella mobility  limited       Ambulation/Gait   Assistive device  R Axillary Crutch;L Axillary Crutch    Gait Pattern  Decreased step length - right;Decreased stance time - left;Decreased weight shift to left             Objective measurements completed on examination: See above findings.              PT Education - 07/17/17 1248    Education provided  Yes     Education Details  knee strength    Person(s) Educated  Patient    Methods  Explanation;Demonstration;Verbal cues;Handout    Comprehension  Verbalized understanding;Returned demonstration          PT Long Term Goals - 07/17/17 1300      PT LONG TERM GOAL #1   Title  independent with HEP and understand how to progress    Baseline  not educated yet    Time  4    Period  Weeks    Status  New    Target Date  08/14/17      PT LONG TERM GOAL #2   Title  able to ambulate without an assistive device due to left knee strength >/= 4/5.     Baseline  left knee and hip strength 2/5    Time  4    Period  Weeks    Status  New    Target Date  08/14/17      PT LONG TERM GOAL #3   Title  able to perform daily tasks at home and talking care of his son with pain level </= 5/10    Baseline  Pain level 7/10    Time  4    Period  Weeks    Target Date  08/14/17      PT LONG TERM GOAL #4   Title  left knee AROM >/= 115 degrees to get in and out of a car with greater ease    Baseline  AROM left knee flexion is 95 degrees    Period  Days    Status  New    Target Date  08/14/17             Plan - 07/17/17 1251    Clinical Impression Statement  Patient is a 47 year old male s/p left knee scope on 07/10/2017.  Patient reports constant left knee  pain at level 7/10.  Left knee pain is worse with weightbear activities and movement.  Left knee AROM is -4-95 degrees and PROM is 0-015 degrees . Patient left knee and hip strength is 2/5.  Patient has decreased mobility of left patella.  Surgical sites are healing and have stitches.  Patient ambulates with bilateral axillary curtches with decreased weightbear on the left.  Patient has edema in left knee measuring 39.4 cm.  Patient has atrophy of left quadriceps.  Patient will benefit from skilled therapy to improve functional mobility and strength whilke reduce pain.     History and Personal Factors relevant to plan of care:  S/P left knee  arthroscopy on 07/10/2017    Clinical Presentation  Evolving    Clinical Presentation due to:  not able to walk on left knee wihtout crutches, difficulty iwth daily tasks due to left knee pain and lack of mobility    Clinical Decision Making  Low    Rehab Potential  Excellent    Clinical Impairments Affecting Rehab Potential  S/P left knee arthroscopy on 07/10/2017    PT Frequency  1x / week    PT Duration  4 weeks    PT Treatment/Interventions  Cryotherapy;Lawyerlectrical Stimulation;Moist Heat;Ultrasound;Gait training;Therapeutic activities;Therapeutic exercise;Patient/family education;Neuromuscular re-education;Manual techniques;Scar mobilization;Passive range of motion;Vasopneumatic Device;Taping    PT Next Visit Plan  left knee ROM exercises; stretches for hamstring, gastroc, quads; work on balance on left LE; step ups; mini squats    PT Home Exercise Plan  progress as needed    Consulted and Agree with Plan of Care  Patient       Patient will benefit from skilled therapeutic intervention in order to improve the following deficits and impairments:  Abnormal gait, Increased fascial restricitons, Pain, Decreased mobility, Decreased scar mobility, Increased muscle spasms, Decreased strength, Decreased range of motion, Decreased activity tolerance, Decreased endurance, Increased edema  Visit Diagnosis: Acute pain of left knee - Plan: PT plan of care cert/re-cert  Stiffness of left knee, not elsewhere classified - Plan: PT plan of care cert/re-cert  Muscle weakness (generalized) - Plan: PT plan of care cert/re-cert  Localized edema - Plan: PT plan of care cert/re-cert  Other abnormalities of gait and mobility - Plan: PT plan of care cert/re-cert     Problem List Patient Active Problem List   Diagnosis Date Noted  . Acute pain of left knee 07/18/2016  . Generalized anxiety disorder 02/08/2016  . Achilles tendon disorder 10/14/2014  . Low back pain 01/09/2014  . HLD (hyperlipidemia)  01/09/2014  . Health care maintenance 12/29/2013  . Tobacco abuse 12/29/2013  . Sciatica 12/29/2013  . Adjustment disorder with mixed anxiety and depressed mood 11/06/2012  . Attention deficit disorder (ADD), child, with hyperactivity 07/17/2012    Eulis Fosterheryl Gray, PT 07/17/17 1:06 PM   [facility] [address], [address] [place], [place], [address] Phone: [phone]   Fax:  [phone]  Name: Kevin Hall MRN: [id number] Date of Birth: May 14, 1971

## 2017-07-19 ENCOUNTER — Encounter: Payer: Self-pay | Admitting: Physical Therapy

## 2017-07-20 ENCOUNTER — Encounter: Payer: Self-pay | Admitting: Physical Therapy

## 2017-07-27 ENCOUNTER — Ambulatory Visit: Payer: Medicaid Other | Attending: Specialist | Admitting: Physical Therapy

## 2017-07-27 ENCOUNTER — Encounter: Payer: Self-pay | Admitting: Physical Therapy

## 2017-07-27 DIAGNOSIS — R600 Localized edema: Secondary | ICD-10-CM

## 2017-07-27 DIAGNOSIS — R2689 Other abnormalities of gait and mobility: Secondary | ICD-10-CM | POA: Diagnosis present

## 2017-07-27 DIAGNOSIS — M6281 Muscle weakness (generalized): Secondary | ICD-10-CM | POA: Insufficient documentation

## 2017-07-27 DIAGNOSIS — M25562 Pain in left knee: Secondary | ICD-10-CM

## 2017-07-27 DIAGNOSIS — M25662 Stiffness of left knee, not elsewhere classified: Secondary | ICD-10-CM | POA: Diagnosis present

## 2017-07-27 NOTE — Therapy (Signed)
[facility] [address], [place], [place], [address] Phone: [phone]   Fax:  [phone]  Physical Therapy Treatment  Patient Details  Name: Kevin Hall MRN: [id number] Date of Birth: 1970-08-24 Referring Provider: Wyatt Portela, PA-C   Encounter Date: 07/27/2017  PT End of Session - 07/27/17 1011    Visit Number  2    Date for PT Re-Evaluation  08/14/17    Authorization Type  Medicaid    PT Start Time  0930    PT Stop Time  1011    PT Time Calculation (min)  41 min    Activity Tolerance  Patient tolerated treatment well    Behavior During Therapy  [facility] for tasks assessed/performed       Past Medical History:  Diagnosis Date  . Achilles tendinitis   . Anxiety   . Back pain   . Depression   . Hypercholesteremia   . Shoulder dislocation     Past Surgical History:  Procedure Laterality Date  . HERNIA REPAIR    . KNEE ARTHROSCOPY    . KNEE ARTHROSCOPY Left 07/10/2017  . TONSILLECTOMY AND ADENOIDECTOMY      There were no vitals filed for this visit.  Subjective Assessment - 07/27/17 0936    Subjective  When I walk I get pressure on patella tendon.  I have clicking on the outside of my left knee. I am not able to walk to the bus stop. I had surgery 2 weeks ago.     Patient Stated Goals  walk without knee brace,     Currently in Pain?  Yes    Pain Location  Knee    Pain Orientation  Left    Pain Descriptors / Indicators  Pressure    Pain Type  Surgical pain    Pain Onset  In the past 7 days    Pain Frequency  Intermittent    Aggravating Factors   walking    Pain Relieving Factors  rest    Multiple Pain Sites  No         [facility] PT Assessment - 07/27/17 0001      Assessment   Medical Diagnosis  Z47.89 [employer] for other orthopedic aftercare    Referring Provider  Wyatt Portela, PA-C    Onset Date/Surgical Date  07/10/17    Prior Therapy  None      Precautions   Precautions  None       Restrictions   Weight Bearing Restrictions  No      Home Environment   Living Environment  Private residence    Living Arrangements  Other (Comment)    Type of Home  Apartment    Home Access  Level entry      Prior Function   Level of Independence  Independent      Cognition   Overall Cognitive Status  Within Functional Limits for tasks assessed      Observation/Other Assessments   Skin Integrity  surgical side is healed with stitches    Focus on Therapeutic Outcomes (FOTO)   patient is 60% limitation due to walking with crutches and other orthopedic problems.       Observation/Other Assessments-Edema    Edema  Circumferential 38 cm left      AROM   AROM Assessment Site  Knee    Right/Left Knee  Left    Left Knee Extension  0    Left Knee Flexion  130  PROM   PROM Assessment Site  Knee    Right/Left Knee  Left    Left Knee Extension  0    Left Knee Flexion  132      Strength   Left Hip Flexion  5/5    Left Knee Flexion  4/5    Left Knee Extension  4/5      Ambulation/Gait   Assistive device  Straight cane                          PT Education - 07/27/17 1010    Education provided  Yes    Education Details  knee strength and flexibility exercise    Person(s) Educated  Patient    Methods  Explanation;Demonstration;Verbal cues;Handout    Comprehension  Returned demonstration;Verbalized understanding          PT Long Term Goals - 07/27/17 1016      PT LONG TERM GOAL #1   Title  independent with HEP and understand how to progress    Baseline  not educated yet    Time  4    Period  Weeks    Status  Achieved      PT LONG TERM GOAL #2   Title  able to ambulate without an assistive device due to left knee strength >/= 4/5.     Time  4    Period  Weeks    Status  Achieved      PT LONG TERM GOAL #3   Title  able to perform daily tasks at home and talking care of his son with pain level </= 5/10    Time  4    Period  Weeks     Status  Achieved      PT LONG TERM GOAL #4   Title  left knee AROM >/= 115 degrees to get in and out of a car with greater ease    Time  4    Period  Weeks    Status  Achieved            Plan - 07/27/17 1012    Rehab Potential  Excellent    Clinical Impairments Affecting Rehab Potential  S/P left knee arthroscopy on 07/10/2017    PT Treatment/Interventions  Cryotherapy;Air traffic controller;Therapeutic activities;Therapeutic exercise;Patient/family education;Neuromuscular re-education;Manual techniques;Scar mobilization;Passive range of motion;Vasopneumatic Device;Taping    PT Next Visit Plan  Discharge this visit to a HEP    PT Home Exercise Plan  Current HEP    Consulted and Agree with Plan of Care  Patient       Patient will benefit from skilled therapeutic intervention in order to improve the following deficits and impairments:  Abnormal gait, Increased fascial restricitons, Pain, Decreased mobility, Decreased scar mobility, Increased muscle spasms, Decreased strength, Decreased range of motion, Decreased activity tolerance, Decreased endurance, Increased edema  Visit Diagnosis: Acute pain of left knee  Stiffness of left knee, not elsewhere classified  Muscle weakness (generalized)  Localized edema  Other abnormalities of gait and mobility     Problem List Patient Active Problem List   Diagnosis Date Noted  . Acute pain of left knee 07/18/2016  . Generalized anxiety disorder 02/08/2016  . Achilles tendon disorder 10/14/2014  . Low back pain 01/09/2014  . HLD (hyperlipidemia) 01/09/2014  . Health care maintenance 12/29/2013  . Tobacco abuse 12/29/2013  . Sciatica 12/29/2013  . Adjustment disorder with mixed anxiety and depressed mood 11/06/2012  .  Attention deficit disorder (ADD), child, with hyperactivity 07/17/2012    Earlie Counts, PT 07/27/17 10:18 AM    [facility] [address], [place], [place], [address] Phone: [phone]   Fax:  [phone]  Name: Kevin Hall MRN: [id number] Date of Birth: 12-14-1970  PHYSICAL THERAPY DISCHARGE SUMMARY  Visits from Start of Care: 4  Current functional level related to goals / functional outcomes: See above.    Remaining deficits: See above.    Education / Equipment: HEP Plan: Patient agrees to discharge.  Patient goals were met. Patient is being discharged due to meeting the stated rehab goals.  Thank you for the referral. Earlie Counts, PT 07/27/17 10:18 AM  ?????

## 2017-07-27 NOTE — Patient Instructions (Addendum)
Knee Flexion: Hamstring Drop (Eccentric) - Prone (Weight Machine)    Lie on stomach. Bend both knees, stopping at 90. Slowly lower feet for 3-5 seconds. Start at 15# __8_ reps per set, __2_ sets per day, 1___ days per week. Add _5__ lbs when you achieve _30__ repetitions.  http://ecce.exer.us/106   Knee Flexion: Hamstring Drop (Eccentric) - Seated (Weight Machine)    Sit on machine. Pull feet back until knees are at 90. Slowly release legs for 3-5 seconds. Start 25# _8__ reps per set, _2__ sets per day, ___ days per week. Add __5_ lbs when you achieve _30__ repetitions.  http://ecce.exer.us/108   Copyright  VHI. All rights reserved.    Knee Extension (Eccentric), (Weight Machine)    Sit on machine. Quickly extend knees. Do not lock knees. Slowly lower for 3-5 seconds. __8_ reps per set, _2__ sets per day, ___ days per week. Add _5__ lbs when you achieve __30_ repetitions.  http://ecce.exer.us/136   Copyright  VHI. All rights reserved.  .  Step: Up, Anterior    Stand facing step. Place involved leg up. Raise body using top leg only. Step down backward, involved leg first, lower body using other leg. 4"-6" or use a curb Repeat _10___ times per set. Do _1___ sets per session. Do __1__ sessions per week.  Copyright  VHI. All rights reserved.  TOTAL LEG: Probation officeriding Stationary Bicycle    Start with _15__ minutes per ride, __1_ rides per day, _3-7__ days per week. __light _ Resistance. As you stronger increase resistance and time.   Copyright  VHI. All rights reserved.  Heel Raise: Bilateral (Standing)    Rise on balls of feet. Can hold onto something Repeat __30__ times per set. Do _1___ sets per session. Do __1__ sessions per day.  http://orth.exer.us/38   Copyright  VHI. All rights reserved.  Gastroc Stretch    Stand with right foot back, leg straight, forward leg bent. Keeping heel on floor, turned slightly out, lean into wall until stretch is felt in  calf. Hold __30__ seconds. Repeat ___2_ times per set. Do _1___ sets per session. Do __1__ sessions per day. 1 http://orth.exer.us/26   Copyright  VHI. All rights reserved.  Achilles Tendon Stretch    Stand with hands supported on wall, elbows slightly bent, feet parallel and both heels on floor, front knee bent, back knee straight. Slowly relax back knee until a stretch is felt in achilles tendon. Hold __30__ seconds. Repeat with leg positions switched.2 times each, 1 time per day.   Copyright  VHI. All rights reserved.  Chair Sitting    Sit at edge of seat, spine straight, one leg extended. Put a hand on each thigh and bend forward from the hip, keeping spine straight. Allow hand on extended leg to reach toward toes. Support upper body with other arm. Hold _30__ seconds. Repeat __2_ times per session. Do _1__ sessions per day.  Copyright  VHI. All rights reserved.  Strengthening: Straight Leg Raise (Phase 1)    Tighten muscles on front of right thigh, then lift leg _3___ inches from surface, keeping knee locked.  Repeat _10___ times per set. Do _3___ sets per session. Do __3__ sessions per day.  Strengthening: Terminal Knee Extension (Supine)    With right knee over bolster, straighten knee by tightening muscles on top of thigh. Keep bottom of knee on bolster.3 Repeat _10___ times per set. Do _3___ sets per session. Do _3___ sessions per day.  Strengthening: Hip Adduction (Side-Lying)  Tighten muscles on front of left thigh, then lift leg __2__ inches from surface, keeping knee locked.  Repeat __10__ times per set. Do ___3_ sets per session. Do __3__ sessions per day.  http://orth.exer.us/624   Copyright  VHI. All rights reserved.  Self-Mobilization: Inward Kneecap Push    With entire length of index finger along outer border of left kneecap, gently push kneecap in toward other leg. Hold ___3_ seconds. Repeat __10__ times per set. Do __1__ sets per  session. Do __1__ sessions per day.  http://orth.exer.us/586   Copyright  VHI. All rights reserved.   Self-Mobilization: Outward Kneecap Pull    With entire length of thumb along inner border of left kneecap, gently pull kneecap out. Hold __2__ seconds. Repeat _10___ times per set. Do _1___ sets per session. Do 1____ sessions per day.  http://orth.exer.us/588   Copyright  VHI. All rights reserved.  [facility] [address] [place], [place] [address] Phone # [phone] Fax [phone]

## 2017-09-03 ENCOUNTER — Ambulatory Visit: Payer: Medicaid Other | Admitting: Physical Therapy

## 2017-09-19 ENCOUNTER — Emergency Department (HOSPITAL_BASED_OUTPATIENT_CLINIC_OR_DEPARTMENT_OTHER)
Admit: 2017-09-19 | Discharge: 2017-09-19 | Disposition: A | Discharge status: Home / Self Care | Payer: Medicaid Other | Attending: Emergency Medicine | Admitting: Emergency Medicine

## 2017-09-19 ENCOUNTER — Emergency Department (HOSPITAL_COMMUNITY): Payer: Medicaid Other

## 2017-09-19 ENCOUNTER — Encounter (HOSPITAL_COMMUNITY): Payer: Self-pay

## 2017-09-19 ENCOUNTER — Emergency Department (HOSPITAL_COMMUNITY)
Admission: EM | Admit: 2017-09-19 | Discharge: 2017-09-19 | Disposition: A | Discharge status: Home / Self Care | Payer: Medicaid Other | Attending: Emergency Medicine | Admitting: Emergency Medicine

## 2017-09-19 ENCOUNTER — Other Ambulatory Visit: Payer: Self-pay

## 2017-09-19 DIAGNOSIS — Z79899 Other long term (current) drug therapy: Secondary | ICD-10-CM | POA: Insufficient documentation

## 2017-09-19 DIAGNOSIS — R05 Cough: Secondary | ICD-10-CM | POA: Insufficient documentation

## 2017-09-19 DIAGNOSIS — F17210 Nicotine dependence, cigarettes, uncomplicated: Secondary | ICD-10-CM | POA: Insufficient documentation

## 2017-09-19 DIAGNOSIS — I824Y1 Acute embolism and thrombosis of unspecified deep veins of right proximal lower extremity: Secondary | ICD-10-CM

## 2017-09-19 DIAGNOSIS — M79609 Pain in unspecified limb: Secondary | ICD-10-CM

## 2017-09-19 DIAGNOSIS — M25561 Pain in right knee: Secondary | ICD-10-CM | POA: Diagnosis present

## 2017-09-19 LAB — CBC WITH DIFFERENTIAL/PLATELET
BASOS ABS: 0.1 10*3/uL (ref 0.0–0.1)
Basophils Relative: 1 %
EOS PCT: 1 %
Eosinophils Absolute: 0.1 10*3/uL (ref 0.0–0.7)
HEMATOCRIT: 48 % (ref 39.0–52.0)
Hemoglobin: 16.5 g/dL (ref 13.0–17.0)
LYMPHS ABS: 2.4 10*3/uL (ref 0.7–4.0)
LYMPHS PCT: 23 %
MCH: 31.9 pg (ref 26.0–34.0)
MCHC: 34.4 g/dL (ref 30.0–36.0)
MCV: 92.7 fL (ref 78.0–100.0)
MONO ABS: 0.5 10*3/uL (ref 0.1–1.0)
Monocytes Relative: 5 %
NEUTROS ABS: 7.1 10*3/uL (ref 1.7–7.7)
Neutrophils Relative %: 70 %
PLATELETS: 263 10*3/uL (ref 150–400)
RBC: 5.18 MIL/uL (ref 4.22–5.81)
RDW: 12.7 % (ref 11.5–15.5)
WBC: 10.2 10*3/uL (ref 4.0–10.5)

## 2017-09-19 LAB — BASIC METABOLIC PANEL
ANION GAP: 8 (ref 5–15)
BUN: 11 mg/dL (ref 6–20)
CO2: 24 mmol/L (ref 22–32)
Calcium: 9.3 mg/dL (ref 8.9–10.3)
Chloride: 107 mmol/L (ref 101–111)
Creatinine, Ser: 0.89 mg/dL (ref 0.61–1.24)
GFR calc Af Amer: >60 mL/min (ref >60)
GLUCOSE: 106 mg/dL — AB (ref 65–99)
POTASSIUM: 4.4 mmol/L (ref 3.5–5.1)
Sodium: 139 mmol/L (ref 135–145)

## 2017-09-19 MED ORDER — RIVAROXABAN (XARELTO) VTE STARTER PACK (15 & 20 MG): ORAL_TABLET | ORAL | 0 refills | Status: DC

## 2017-09-19 MED ORDER — OXYCODONE-ACETAMINOPHEN 5-325 MG PO TABS
1.0000 | ORAL_TABLET | Freq: Once | ORAL | Status: AC
  Administered 2017-09-19: 1 via ORAL
  Filled 2017-09-19: qty 1

## 2017-09-19 MED ORDER — RIVAROXABAN 20 MG PO TABS: 20.0000 mg | ORAL_TABLET | Freq: Every day | ORAL | Status: DC

## 2017-09-19 MED ORDER — IOPAMIDOL (ISOVUE-370) INJECTION 76%
INTRAVENOUS | Status: AC
  Administered 2017-09-19: 100 mL
  Filled 2017-09-19: qty 100

## 2017-09-19 MED ORDER — RIVAROXABAN 15 MG PO TABS
15.0000 mg | ORAL_TABLET | Freq: Two times a day (BID) | ORAL | Status: DC
  Administered 2017-09-19: 15 mg via ORAL
  Filled 2017-09-19: qty 1

## 2017-09-19 MED ORDER — HYDROMORPHONE HCL 1 MG/ML IJ SOLN
1.0000 mg | Freq: Once | INTRAMUSCULAR | Status: AC
  Administered 2017-09-19: 1 mg via INTRAMUSCULAR
  Filled 2017-09-19: qty 1

## 2017-09-19 NOTE — ED Notes (Signed)
Pt states that since his left knee surgery in January, his right leg has become the "primary" and his R knee has become more painful since. States that he has almost fallen a few times this morning because of the pain.

## 2017-09-19 NOTE — ED Provider Notes (Signed)
[facility] EMERGENCY DEPARTMENT Provider Note   CSN: [id number] Arrival date & time: 09/19/17  0750     History   Chief Complaint CC: right knee and calf pain  HPI   Blood pressure (!) 147/95, pulse 87, temperature 97.7 F (36.5 C), temperature source Oral, resp. rate 16, SpO2 98 %.  Kevin Hall is a 47 y.o. male complaining of atraumatic pain to right knee and right calf onset 1 week ago.  He had a surgery on the left leg by Dr. Thomasena Edis in January.  He has not been able to afford physical therapy, he is ambulating with a cane.  He is in pain management for chronic low back pain and regularly takes Lyrica, oxycodone and meloxicam.  He is concerned about DVT.  He denies any chest pain, palpitations, cough, hemoptysis, fever, shortness of breath or syncope.   Past Medical History:  Diagnosis Date  . Achilles tendinitis   . Anxiety   . Back pain   . Depression   . Hypercholesteremia   . Shoulder dislocation     Patient Active Problem List   Diagnosis Date Noted  . Acute pain of left knee 07/18/2016  . Generalized anxiety disorder 02/08/2016  . Achilles tendon disorder 10/14/2014  . Low back pain 01/09/2014  . HLD (hyperlipidemia) 01/09/2014  . Health care maintenance 12/29/2013  . Tobacco abuse 12/29/2013  . Sciatica 12/29/2013  . Adjustment disorder with mixed anxiety and depressed mood 11/06/2012  . 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 HERNIA REPAIR    . KNEE ARTHROSCOPY    . KNEE ARTHROSCOPY Left 07/10/2017  . TONSILLECTOMY AND ADENOIDECTOMY          Home Medications    Prior to Admission medications   Medication Sig Start Date End Date Taking? Authorizing Provider  atorvastatin (LIPITOR) 40 MG tablet Take 40 mg by mouth daily. 07/13/16  Yes [provider]  clonazePAM (KLONOPIN) 0.5 MG tablet 1  qday  1  prn Patient taking differently:  Take 0.5 mg by mouth 2 (two) times daily as needed for anxiety.  05/25/17  Yes Plovsky, Earvin Hansen, MD  lidocaine (XYLOCAINE) 5 % ointment Apply 1 application topically daily as needed for pain. 08/07/17  Yes [provider]  methocarbamol (ROBAXIN) 500 MG tablet Take 500 mg by mouth 3 (three) times daily.   Yes [provider]  OxyCODONE HCl, Abuse Deter, (OXAYDO) 5 MG TABA Take 5 mg by mouth every 4 (four) hours.   Yes [provider]  pregabalin (LYRICA) 150 MG capsule Take 150 mg by mouth 3 (three) times daily.   Yes [provider]  ranitidine (RANITIDINE 150 MAX STRENGTH) 150 MG tablet Take 150 mg by mouth 2 (two) times daily.   Yes [provider]  cyclobenzaprine (FLEXERIL) 10 MG tablet Take 1 tablet (10 mg total) by mouth 3 (three) times daily as needed for muscle spasms. Patient not taking: Reported on 07/17/2017 02/18/14   Ivonne Andrew, PA-C  HYDROcodone-acetaminophen (NORCO/VICODIN) 5-325 MG tablet Take 1 tablet by mouth every 4 (four) hours as needed. Patient not taking: Reported on 09/19/2017 10/26/15   Jacalyn Lefevre, MD  Rivaroxaban 15 & 20 MG TBPK Take as directed on package: Start with one 15mg  tablet by mouth twice a day with food. On Day 22, switch to one 20mg  tablet once a day with food. 09/19/17   Rahul Malinak, Joni Reining, PA-C  Family History Family History  Problem Relation Age of Onset  . Anxiety disorder Mother   . Depression Mother   . Diabetes Mother   . Hypertension Mother   . Thyroid disease Mother   . Cancer - Lung Father   . Alcohol abuse Father   . Cancer Father   . Bipolar disorder Brother   . Anxiety disorder Sister     Social History Social History   Tobacco Use  . Smoking status: Current Every Day Smoker    Packs/day: 1.00    Years: 31.00    Pack years: 31.00    Types: Cigarettes  . Smokeless tobacco: Never Used  Substance Use Topics  . Alcohol use: No    Comment: a few beers every month   . Drug use: No     Types: Marijuana, LSD    Comment: Drug use twenty years ago      Allergies   Patient has no known allergies.   Review of Systems Review of Systems  A complete review of systems was obtained and all systems are negative except as noted in the HPI and PMH.    Physical Exam Updated Vital Signs BP 128/90 (BP Location: Left Arm)   Pulse 88   Temp 97.7 F (36.5 C) (Oral)   Resp 16   Ht 5\' 9"  (1.753 m)   Wt 90.7 kg (200 lb)   SpO2 96%   BMI 29.53 kg/m   Physical Exam  Constitutional: He is oriented to person, place, and time. He appears well-developed and well-nourished. No distress.  HENT:  Head: Normocephalic and atraumatic.  Mouth/Throat: Oropharynx is clear and moist.  Eyes: Pupils are equal, round, and reactive to light. Conjunctivae and EOM are normal.  Neck: Normal range of motion.  Cardiovascular: Normal rate, regular rhythm and intact distal pulses.  Pulmonary/Chest: Effort normal and breath sounds normal.  Abdominal: Soft. There is no tenderness.  Musculoskeletal: Normal range of motion. He exhibits edema and tenderness.  Right knee:  No deformity, erythema or abrasions. FROM. No effusion or crepitance. Anterior and posterior drawer show no abnormal laxity. Stable to valgus and varus stress. Joint lines are non-tender. Neurovascularly intact.   Neurological: He is alert and oriented to person, place, and time.  Skin: He is not diaphoretic.  Psychiatric: He has a normal mood and affect.  Nursing note and vitals reviewed.    ED Treatments / Results  Labs (all labs ordered are listed, but only abnormal results are displayed) Labs Reviewed  BASIC METABOLIC PANEL - Abnormal; Notable for the following components:      Result Value   Glucose, Bld 106 (*)    All other components within normal limits  CBC WITH DIFFERENTIAL/PLATELET    EKG None  Radiology Ct Angio Chest Pe W And/or Wo Contrast  Result Date: 09/19/2017 CLINICAL DATA:  Right lower extremity  DVT. PE suspected, intermediate probability, positive D-dimer. EXAM: CT ANGIOGRAPHY CHEST WITH CONTRAST TECHNIQUE: Multidetector CT imaging of the chest was performed using the standard protocol during bolus administration of intravenous contrast. Multiplanar CT image reconstructions and MIPs were obtained to evaluate the vascular anatomy. CONTRAST:  100mL ISOVUE-370 IOPAMIDOL (ISOVUE-370) INJECTION 76% COMPARISON:  Two-view chest x-ray 10/26/2015. FINDINGS: Cardiovascular: Heart size is normal. Aorta and great vessels are within normal limits. No significant vascular calcifications are present. No focal filling defects are present to suggest pulmonary emboli. Mediastinum/Nodes: The thoracic inlet is within normal limits. Esophagus is unremarkable. Lungs/Pleura: There is scattered ground-glass attenuation, likely  reflecting atelectasis. No focal airspace consolidation is present. There is no nodule or lung mass. No significant pleural effusion is present. Upper Abdomen: Within normal limits. Musculoskeletal: Ribs are within normal limits. 12 rib-bearing thoracic type vertebral bodies are present. No focal lytic or blastic lesions are present. Review of the MIP images confirms the above findings. IMPRESSION: 1. No pulmonary embolus. 2. Mild scattered ground-glass attenuation likely reflects atelectasis. Edema is considered less likely. 3. Normal appearance of the heart, aorta, and proximal pulmonary arteries. Electronically Signed   By: Marin Roberts M.D.   On: 09/19/2017 15:55   Dg Knee Complete 4 Views Right  Result Date: 09/19/2017 CLINICAL DATA:  Worsening chronic right lateral knee pain. EXAM: RIGHT KNEE - COMPLETE 4+ VIEW COMPARISON:  Right knee series of June 20, 2007 and MRI of the right knee of July 01, 2007 FINDINGS: The bones are subjectively adequately mineralized. The joint spaces are well maintained. There is beaking of the tibial spines. Spurs arise from the superior articular margin  of the patella. There is a small suprapatellar effusion. There is no acute fracture nor dislocation. IMPRESSION: Mild degenerative spurring of the tibial spines and superior margin of the patella. Small suprapatellar effusion. No acute bony abnormality or significant joint space loss. Electronically Signed   By: David  [place] M.D.   On: 09/19/2017 09:20    Procedures Procedures (including critical care time)  Medications Ordered in ED Medications  HYDROmorphone (DILAUDID) injection 1 mg (1 mg Intramuscular Given 09/19/17 1057)  oxyCODONE-acetaminophen (PERCOCET/ROXICET) 5-325 MG per tablet 1 tablet (1 tablet Oral Given 09/19/17 1255)  iopamidol (ISOVUE-370) 76 % injection (100 mLs  Contrast Given 09/19/17 1511)     Initial Impression / Assessment and Plan / ED Course  I have reviewed the triage vital signs and the nursing notes.  Pertinent labs & imaging results that were available during my care of the patient were reviewed by me and considered in my medical decision making (see chart for details).     Vitals:   09/19/17 0754 09/19/17 1258 09/19/17 1301 09/19/17 1624  BP: (!) 147/95 (!) 137/98  128/90  Pulse: 87 72  88  Resp: 16 18  16   Temp: 97.7 F (36.5 C)     TempSrc: Oral     SpO2: 98% 98%  96%  Weight:   90.7 kg (200 lb)   Height:   5\' 9"  (1.753 m)     Medications  HYDROmorphone (DILAUDID) injection 1 mg (1 mg Intramuscular Given 09/19/17 1057)  oxyCODONE-acetaminophen (PERCOCET/ROXICET) 5-325 MG per tablet 1 tablet (1 tablet Oral Given 09/19/17 1255)  iopamidol (ISOVUE-370) 76 % injection (100 mLs  Contrast Given 09/19/17 1511)    Kevin Hall is 47 y.o. male presenting with right knee and calf pain, this is atraumatic.  He had a recent surgery to the contralateral knee.  Given the calf pain I am concerned for a DVT.  This patient is denying any chest pain, shortness of breath or palpitations.  Vascular duplex does reveal DVT.  We have had an extensive discussion over  anticoagulation, since transportation is an issue for this patient we decided to go with a newer anticoagulation.  He understands that he will need to return to the ED for recheck for any head trauma, significant thoracic or abdominal trauma.  Patient verbalized understanding and teach back technique.  On further discussion with this patient he states he is being treated for an upper respiratory infection with cough, he does feel improved but  he states that he would like to be evaluated for a PE.  CTA reveals no PE.  Patient states that he is going to follow closely with his primary care doctor in fact he states he is on is where they do this afternoon.  Patient started on Xarelto and pharmacy has counseled this patient on its use.  Evaluation does not show pathology that would require ongoing emergent intervention or inpatient treatment. Pt is hemodynamically stable and mentating appropriately. Discussed findings and plan with patient/guardian, who agrees with care plan. All questions answered. Return precautions discussed and outpatient follow up given.     Final Clinical Impressions(s) / ED Diagnoses   Final diagnoses:  Acute deep vein thrombosis (DVT) of proximal vein of right lower extremity [facility])    ED Discharge Orders        Ordered    Rivaroxaban 15 & 20 MG TBPK     09/19/17 1602       Aariyana Manz, Mardella Layman 09/20/17 1908    Melene Plan, DO 09/20/17 1951

## 2017-09-19 NOTE — Discharge Instructions (Signed)
Start taking the Xarelto as instructed.  You should not take ibuprofen with Xarelto.  You must stop taking ibuprofen.  You can continue to take the oxycodone and Lyrica.  Do not hesitate to return to the emergency department for a recheck for any head, chest or belly trauma.  If you develop any chest pain, shortness of breath, palpitations return to the ED for recheck.  Please follow very closely with your primary care doctor, they will need to obtain records for the ED and manage your deep vein thrombosis in her left lower extremity going forward.

## 2017-09-19 NOTE — Progress Notes (Signed)
*  Preliminary Results* Right lower extremity venous duplex completed. Right lower extremity is positive for acute deep vein thrombosis involving a small segment of a single peroneal vein. There is no evidence of right Baker's cyst.  Preliminary results discussed with Jeff HedgeBurna Fortss, PA-C.  09/19/2017 12:19 PM  Gertie FeyMichelle Safiyah Cisney, BS, RVT, RDCS, RDMS

## 2017-09-19 NOTE — ED Triage Notes (Signed)
Patient complains of right knee pain with radiation to calf since 3/18. Reports recent left knee surgery and unsure if related, denies trauma

## 2017-09-19 NOTE — Progress Notes (Signed)
ANTICOAGULATION CONSULT NOTE - Initial Consult  Pharmacy Consult for Xarelto  Indication: DVT  No Known Allergies  Patient Measurements: Height: 5\' 9"  (175.3 cm) Weight: 200 lb (90.7 kg) IBW/kg (Calculated) : 70.7  Vital Signs: Temp: 97.7 F (36.5 C) (03/27 0754) Temp Source: Oral (03/27 0754) BP: 137/98 (03/27 1258) Pulse Rate: 72 (03/27 1258)  Labs: Recent Labs    09/19/17 1244  HGB 16.5  HCT 48.0  PLT 263  CREATININE 0.89    Estimated Creatinine Clearance: 114.2 mL/min (by C-G formula based on SCr of 0.89 mg/dL).   Medical History: Past Medical History:  Diagnosis Date  . Achilles tendinitis   . Anxiety   . Back pain   . Depression   . Hypercholesteremia   . Shoulder dislocation     Assessment: 46 yoM found to have LE DVT; no anticoagulation PTA; CrCl > 16700mL/min; Patient will start on Xarelto and has been educated in the ED.    Plan:  Xarelto 15mg  BID x21 Days, then 20mg  QD Monitor for s/sx of bleeding  Maddyson Keil L Mally Gavina 09/19/2017,2:54 PM

## 2017-10-16 ENCOUNTER — Encounter (HOSPITAL_COMMUNITY): Payer: Self-pay | Admitting: *Deleted

## 2017-10-16 ENCOUNTER — Other Ambulatory Visit: Payer: Self-pay

## 2017-10-16 ENCOUNTER — Emergency Department (HOSPITAL_COMMUNITY)
Admission: EM | Admit: 2017-10-16 | Discharge: 2017-10-16 | Disposition: A | Discharge status: Home / Self Care | Payer: Medicaid Other | Attending: Emergency Medicine | Admitting: Emergency Medicine

## 2017-10-16 ENCOUNTER — Emergency Department (HOSPITAL_BASED_OUTPATIENT_CLINIC_OR_DEPARTMENT_OTHER): Payer: Medicaid Other

## 2017-10-16 DIAGNOSIS — M5431 Sciatica, right side: Secondary | ICD-10-CM

## 2017-10-16 DIAGNOSIS — M79604 Pain in right leg: Secondary | ICD-10-CM | POA: Diagnosis present

## 2017-10-16 DIAGNOSIS — Z79899 Other long term (current) drug therapy: Secondary | ICD-10-CM | POA: Insufficient documentation

## 2017-10-16 DIAGNOSIS — F17210 Nicotine dependence, cigarettes, uncomplicated: Secondary | ICD-10-CM | POA: Diagnosis not present

## 2017-10-16 DIAGNOSIS — M79609 Pain in unspecified limb: Secondary | ICD-10-CM | POA: Diagnosis not present

## 2017-10-16 MED ORDER — CYCLOBENZAPRINE HCL 10 MG PO TABS: 10.0000 mg | ORAL_TABLET | Freq: Three times a day (TID) | ORAL | 0 refills | Status: DC | PRN

## 2017-10-16 MED ORDER — OXYCODONE-ACETAMINOPHEN 5-325 MG PO TABS
1.0000 | ORAL_TABLET | ORAL | Status: DC | PRN
  Administered 2017-10-16: 1 via ORAL
  Filled 2017-10-16: qty 1

## 2017-10-16 MED ORDER — PREDNISONE 50 MG PO TABS: 50.0000 mg | ORAL_TABLET | Freq: Every day | ORAL | 0 refills | Status: DC

## 2017-10-16 MED ORDER — MORPHINE SULFATE (PF) 4 MG/ML IV SOLN
4.0000 mg | Freq: Once | INTRAVENOUS | Status: AC
  Administered 2017-10-16: 4 mg via INTRAMUSCULAR
  Filled 2017-10-16: qty 1

## 2017-10-16 NOTE — ED Provider Notes (Signed)
[facility] EMERGENCY DEPARTMENT Provider Note   CSN: [id number] Arrival date & time: 10/16/17  0430     History   Chief Complaint Chief Complaint  Patient presents with  . Leg Pain    HPI Kevin Hall is a 47 y.o. male.  HPI Patient presents to the emergency department with right leg pain that started several weeks ago.  The patient states that he was seen here and had a noted DVT but then followed up 2 weeks later with his primary doctor who states that he no longer headed on an ultrasound.  Patient states that the pain seems to radiate up and down his leg.  Patient states he has chronic back pain which she sees pain management for.  The patient states that he took more than his prescribed doses of pain medicine for his leg.  Patient states he finished 120 Percocet in around 2 weeks.  Patient states that the pain has been increasing.  He states he can walk but there is significant pain in his leg when he walks.  The patient states nothing seems to make the condition better.  The patient denies chest pain, shortness of breath, headache,blurred vision, neck pain, fever, cough, weakness, numbness, dizziness, anorexia, edema, abdominal pain, nausea, vomiting, diarrhea, rash,dysuria, hematemesis, bloody stool, near syncope, or syncope. Past Medical History:  Diagnosis Date  . Achilles tendinitis   . Anxiety   . Back pain   . Depression   . Hypercholesteremia   . Shoulder dislocation     Patient Active Problem List   Diagnosis Date Noted  . Acute pain of left knee 07/18/2016  . Generalized anxiety disorder 02/08/2016  . Achilles tendon disorder 10/14/2014  . Low back pain 01/09/2014  . HLD (hyperlipidemia) 01/09/2014  . Health care maintenance 12/29/2013  . Tobacco abuse 12/29/2013  . Sciatica 12/29/2013  . Adjustment disorder with mixed anxiety and depressed mood 11/06/2012  . 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 HERNIA REPAIR    . KNEE ARTHROSCOPY    . KNEE ARTHROSCOPY Left 07/10/2017  . TONSILLECTOMY AND ADENOIDECTOMY          Home Medications    Prior to Admission medications   Medication Sig Start Date End Date Taking? Authorizing Provider  atorvastatin (LIPITOR) 40 MG tablet Take 40 mg by mouth daily. 07/13/16   [provider]  clonazePAM (KLONOPIN) 0.5 MG tablet 1  qday  1  prn Patient taking differently: Take 0.5 mg by mouth 2 (two) times daily as needed for anxiety.  05/25/17   Plovsky, Earvin Hansen, MD  cyclobenzaprine (FLEXERIL) 10 MG tablet Take 1 tablet (10 mg total) by mouth 3 (three) times daily as needed for muscle spasms. 10/16/17   Alvester Eads, [name], PA-C  HYDROcodone-acetaminophen (NORCO/VICODIN) 5-325 MG tablet Take 1 tablet by mouth every 4 (four) hours as needed. Patient not taking: Reported on 09/19/2017 10/26/15   Jacalyn Lefevre, MD  lidocaine (XYLOCAINE) 5 % ointment Apply 1 application topically daily as needed for pain. 08/07/17   [provider]  methocarbamol (ROBAXIN) 500 MG tablet Take 500 mg by mouth 3 (three) times daily.    [provider]  OxyCODONE HCl, Abuse Deter, (OXAYDO) 5 MG TABA Take 5 mg by mouth every 4 (four) hours.    [provider]  predniSONE (DELTASONE) 50 MG tablet Take 1 tablet (50 mg total) by mouth daily. 10/16/17   Cleavon Goldman,  Gwendy Boeder, PA-C  pregabalin (LYRICA) 150 MG capsule Take 150 mg by mouth 3 (three) times daily.    [provider]  ranitidine (RANITIDINE 150 MAX STRENGTH) 150 MG tablet Take 150 mg by mouth 2 (two) times daily.    [provider]  Rivaroxaban 15 & 20 MG TBPK Take as directed on package: Start with one 15mg  tablet by mouth twice a day with food. On Day 22, switch to one 20mg  tablet once a day with food. 09/19/17   Pisciotta, Joni Reining, PA-C    Family History Family History  Problem Relation Age of Onset  . Anxiety disorder Mother    . Depression Mother   . Diabetes Mother   . Hypertension Mother   . Thyroid disease Mother   . Cancer - Lung Father   . Alcohol abuse Father   . Cancer Father   . Bipolar disorder Brother   . Anxiety disorder Sister     Social History Social History   Tobacco Use  . Smoking status: Current Every Day Smoker    Packs/day: 1.00    Years: 31.00    Pack years: 31.00    Types: Cigarettes  . Smokeless tobacco: Never Used  Substance Use Topics  . Alcohol use: No    Comment: a few beers every month   . Drug use: No    Types: Marijuana, LSD    Comment: Drug use twenty years ago      Allergies   Patient has no known allergies.   Review of Systems Review of Systems All other systems negative except as documented in the HPI. All pertinent positives and negatives as reviewed in the HPI.  Physical Exam Updated Vital Signs BP (!) 162/71 (BP Location: Right Arm)   Pulse 84   Temp 98.5 F (36.9 C) (Oral)   Resp 20   Ht 5\' 9"  (1.753 m)   Wt 90.7 kg (200 lb)   SpO2 100%   BMI 29.53 kg/m   Physical Exam  Constitutional: He is oriented to person, place, and time. He appears well-developed and well-nourished. No distress.  HENT:  Head: Normocephalic and atraumatic.  Mouth/Throat: Oropharynx is clear and moist.  Eyes: Pupils are equal, round, and reactive to light.  Neck: Normal range of motion. Neck supple.  Cardiovascular: Normal rate, regular rhythm and normal heart sounds. Exam reveals no gallop and no friction rub.  No murmur heard. Pulmonary/Chest: Effort normal and breath sounds normal. No respiratory distress. He has no wheezes.  Abdominal: Soft. Bowel sounds are normal. He exhibits no distension. There is no tenderness.  Neurological: He is alert and oriented to person, place, and time. He has normal strength. He displays normal reflexes. No sensory deficit. He exhibits normal muscle tone. Coordination and gait normal.  Skin: Skin is warm and dry. Capillary refill  takes less than 2 seconds. No rash noted. No erythema.  Psychiatric: He has a normal mood and affect. His behavior is normal.  Nursing note and vitals reviewed.    ED Treatments / Results  Labs (all labs ordered are listed, but only abnormal results are displayed) Labs Reviewed - No data to display  EKG None  Radiology No results found.  Procedures Procedures (including critical care time)  Medications Ordered in ED Medications  morphine 4 MG/ML injection 4 mg (4 mg Intramuscular Given 10/16/17 1218)     Initial Impression / Assessment and Plan / ED Course  I have reviewed the triage vital signs and the nursing notes.  Pertinent labs & imaging results that were available during my care of the patient were reviewed by me and considered in my medical decision making (see chart for details).     The patient has no DVT noted today on repeat imaging.  I do feel that this is a sciatic issue coming from his chronic lower back pain.  I will have the patient see his chronic pain specialist for further evaluation and possible MRI.  Patient is able to walk without significant difficulty is able to cross his legs and raise his legs without any significant issues has normal strength in his lower extremities.  I have advised the patient to return here as needed.  Patient agrees to the plan and all questions were answered.  Final Clinical Impressions(s) / ED Diagnoses   Final diagnoses:  Sciatica of right side    ED Discharge Orders        Ordered    predniSONE (DELTASONE) 50 MG tablet  Daily     10/16/17 1228    cyclobenzaprine (FLEXERIL) 10 MG tablet  3 times daily PRN     10/16/17 1228       Charlestine NightLawyer, Jaysean Manville, PA-C 10/16/17 1628    Gwyneth SproutPlunkett, Whitney, MD 10/16/17 2102

## 2017-10-16 NOTE — ED Triage Notes (Signed)
C/o pain in right leg onset March. States he was seen in the ED and dx. With DVT was started on xarelto however after seeing his PCP he didn't agree with dx. Per patient. Patient took himself off xarelto several days ago so he could take ibuprofen. States his leg hurts from his hip to his ankle.

## 2017-10-16 NOTE — ED Notes (Signed)
Pt in vascular for DVT study

## 2017-10-16 NOTE — ED Notes (Signed)
Pt returned from vascular

## 2017-10-16 NOTE — Progress Notes (Signed)
RLE venous duplex prelim: negative for DVT. Previous DVT appears to have resolved.  Soriah Leeman Eunice, RDMS, RVT  

## 2017-10-16 NOTE — Discharge Instructions (Addendum)
Return here as needed. Follow up with your Pain Doctor by calling to see if they can see you tomorrow.

## 2017-10-18 ENCOUNTER — Ambulatory Visit (INDEPENDENT_AMBULATORY_CARE_PROVIDER_SITE_OTHER): Payer: Medicaid Other | Admitting: Psychiatry

## 2017-10-18 ENCOUNTER — Encounter (HOSPITAL_COMMUNITY): Payer: Self-pay | Admitting: Psychiatry

## 2017-10-18 DIAGNOSIS — F4323 Adjustment disorder with mixed anxiety and depressed mood: Secondary | ICD-10-CM | POA: Diagnosis not present

## 2017-10-18 DIAGNOSIS — F17210 Nicotine dependence, cigarettes, uncomplicated: Secondary | ICD-10-CM

## 2017-10-18 DIAGNOSIS — Z811 Family history of alcohol abuse and dependence: Secondary | ICD-10-CM | POA: Diagnosis not present

## 2017-10-18 DIAGNOSIS — M79604 Pain in right leg: Secondary | ICD-10-CM | POA: Diagnosis not present

## 2017-10-18 DIAGNOSIS — Z818 Family history of other mental and behavioral disorders: Secondary | ICD-10-CM | POA: Diagnosis not present

## 2017-10-18 MED ORDER — CLONAZEPAM 0.5 MG PO TABS: 0.5000 mg | ORAL_TABLET | Freq: Two times a day (BID) | ORAL | 3 refills | Status: DC | PRN

## 2017-10-18 MED ORDER — DULOXETINE HCL 30 MG PO CPEP: ORAL_CAPSULE | ORAL | 3 refills | Status: DC

## 2017-10-18 NOTE — Progress Notes (Signed)
[facility] MD Progress Note  10/18/2017 2:35 PM Kevin Hall  MRN:  [id number] Subjective:  No real change doing okay Principal Problem: Adjustment disorder with anxious mood state This patient recently had left knee surgery apparently is not complete complications. One other complications is is having more severe significant pain on his right leg which was initially his bad leg. Is been given OxyContin and unfortunately was not adequate. He took some extra. He takes Klonopin from 8.5 mg 1 a day one extra if he needs it. Been on Klonopin for years. He's also been on opiates while being on Klonopin in the past years without any problems regarding respiratory depression. He shows no evidence of abuse since I've cared for him. I do not believe he is for 10 pain. Truly think is having great problems walking and now is worse after left knee surgery which apparently is not been beneficial. He continues asleep any fairly well. He's feels very distressed and anxious. Using no illegal substances. He denies the use of marijuana cocaine or any substance. In fact he just stopped smoking cigarettes a month ago. The patient is very invested taking care of his young son is going to be evaluated for attention deficit disorder. Today we had a long discussion of his medications. His doctor apparently discontinued his Neurontin and placed him on Lyrica and now is up to 600 mg minimal benefits. He believes it might be making his leg worse and he himself discontinued it days ago. I strongly recommended that it's unlikely Lyrica is causing his pain. I therefore recommended that if his pain is not improved since she stopped Lyrica to go back to the dose. He talked about when he did take some Neurontin but I recommended he talk to his pain doctor. I will over contributing to his care by private prescribing Cymbalta. . Total Time spent with patient: 15 minutes  Past Psychiatric History:   Past Medical History:  Past Medical History:   Diagnosis Date  . Achilles tendinitis   . Anxiety   . Back pain   . Depression   . Hypercholesteremia   . Shoulder dislocation     Past Surgical History:  Procedure Laterality Date  . HERNIA REPAIR    . KNEE ARTHROSCOPY    . KNEE ARTHROSCOPY Left 07/10/2017  . TONSILLECTOMY AND ADENOIDECTOMY     Family History:  Family History  Problem Relation Age of Onset  . Anxiety disorder Mother   . Depression Mother   . Diabetes Mother   . Hypertension Mother   . Thyroid disease Mother   . Cancer - Lung Father   . Alcohol abuse Father   . Cancer Father   . Bipolar disorder Brother   . Anxiety disorder Sister    Family Psychiatric  History:  Social History:  Social History   Substance and Sexual Activity  Alcohol Use No   Comment: a few beers every month      Social History   Substance and Sexual Activity  Drug Use No  . Types: Marijuana, LSD   Comment: Drug use twenty years ago     Social History   Socioeconomic History  . Marital status: Divorced    Spouse name: Not on file  . Number of children: Not on file  . Years of education: Not on file  . Highest education level: Not on file  Occupational History  . Not on file  Social Needs  . Financial resource strain: Not on file  .  Food insecurity:    Worry: Not on file    Inability: Not on file  . Transportation needs:    Medical: Not on file    Non-medical: Not on file  Tobacco Use  . Smoking status: Current Every Day Smoker    Packs/day: 1.00    Years: 31.00    Pack years: 31.00    Types: Cigarettes  . Smokeless tobacco: Never Used  Substance and Sexual Activity  . Alcohol use: No    Comment: a few beers every month   . Drug use: No    Types: Marijuana, LSD    Comment: Drug use twenty years ago   . Sexual activity: Not Currently  Lifestyle  . Physical activity:    Days per week: Not on file    Minutes per session: Not on file  . Stress: Not on file  Relationships  . Social connections:    Talks  on phone: Not on file    Gets together: Not on file    Attends religious service: Not on file    Active member of club or organization: Not on file    Attends meetings of clubs or organizations: Not on file    Relationship status: Not on file  Other Topics Concern  . Not on file  Social History Narrative  . Not on file   Additional Social History:                         Sleep: Good  Appetite:  Good  Current Medications: Current Outpatient Medications  Medication Sig Dispense Refill  . atorvastatin (LIPITOR) 40 MG tablet Take 40 mg by mouth daily.  5  . clonazePAM (KLONOPIN) 0.5 MG tablet Take 1 tablet (0.5 mg total) by mouth 2 (two) times daily as needed for anxiety. 60 tablet 3  . cyclobenzaprine (FLEXERIL) 10 MG tablet Take 1 tablet (10 mg total) by mouth 3 (three) times daily as needed for muscle spasms. 15 tablet 0  . lidocaine (XYLOCAINE) 5 % ointment Apply 1 application topically daily as needed for pain.  5  . methocarbamol (ROBAXIN) 500 MG tablet Take 500 mg by mouth 3 (three) times daily.    . OxyCODONE HCl, Abuse Deter, (OXAYDO) 5 MG TABA Take 5 mg by mouth every 4 (four) hours.    . predniSONE (DELTASONE) 50 MG tablet Take 1 tablet (50 mg total) by mouth daily. 5 tablet 0  . ranitidine (RANITIDINE 150 MAX STRENGTH) 150 MG tablet Take 150 mg by mouth 2 (two) times daily.    . DULoxetine (CYMBALTA) 30 MG capsule 1  qam   For  1 week then  2  qam 60 capsule 3  . HYDROcodone-acetaminophen (NORCO/VICODIN) 5-325 MG tablet Take 1 tablet by mouth every 4 (four) hours as needed. (Patient not taking: Reported on 10/18/2017) 10 tablet 0  . pregabalin (LYRICA) 150 MG capsule Take 150 mg by mouth 3 (three) times daily.    . Rivaroxaban 15 & 20 MG TBPK Take as directed on package: Start with one 15mg  tablet by mouth twice a day with food. On Day 22, switch to one 20mg  tablet once a day with food. (Patient not taking: Reported on 10/18/2017) 51 each 0   No current  facility-administered medications for this visit.     Lab Results: No results found for this or any previous visit (from the past 48 hour(s)).  Blood Alcohol level:  [facility] found for: [facility]  Physical Findings: AIMS:  , ,  ,  ,    CIWA:    COWS:     Musculoskeletal: Strength & Muscle Tone: within normal limits Gait & Station: normal Patient leans: N/A  Psychiatric Specialty Exam: ROS  Blood pressure 125/80, pulse 98, height 5\' 9"  (1.753 m), weight 192 lb (87.1 kg), SpO2 99 %.Body mass index is 28.35 kg/m.  General Appearance: Casual  Eye Contact::  Fair  Speech:  Clear and Coherent  Volume:  Normal  Mood:  Dysphoric  Affect:  Congruent  Thought Process:  Coherent  Orientation:  Full (Time, Place, and Person)  Thought Content:  WDL  Suicidal Thoughts:  No  Homicidal Thoughts:  No  Memory:  NA  Judgement:  Fair  Insight:  Fair  Psychomotor Activity:  Normal  Concentration:  Fair  Recall:  Good  Fund of Knowledge:Fair  Language: Good   Akathisia:  No  Handed:  Right  AIMS (if indicated):     Assets:  Communication Skills  ADL's:  Intact  Cognition: WNL  Sleep:      Treatment Plan Summary: At this time the patient is not doing well. He's having significant pain. He is a hard time managing his pain. It's hard for this individual to manage his OxyContin. The idea of getting him some other non-opiate pain medications is really the best thing in this case. He'll continue taking his low-dose Klonopin 0.5 mg one in the morning and one extra if he needs it. I suspect he needs a lot. Today we'll go ahead and begin him on Cymbalta 30 mg and increase it to 60 mg after 1 week. He'll return to see me in about 2 months my hope is his pain will be better and only less anxious and depressed. This patient is not suicidal. I think this patient is relatively reliable. He tries to keep himself busy. He has a hearing coming up in the next week or 2 about his disability.

## 2017-10-19 ENCOUNTER — Ambulatory Visit (HOSPITAL_COMMUNITY): Payer: Self-pay | Admitting: Psychiatry

## 2017-12-18 ENCOUNTER — Ambulatory Visit (HOSPITAL_COMMUNITY): Payer: Medicaid Other | Admitting: Psychiatry

## 2018-01-17 ENCOUNTER — Ambulatory Visit (INDEPENDENT_AMBULATORY_CARE_PROVIDER_SITE_OTHER): Payer: Medicaid Other | Admitting: Psychiatry

## 2018-01-17 ENCOUNTER — Encounter

## 2018-01-17 ENCOUNTER — Encounter (HOSPITAL_COMMUNITY): Payer: Self-pay | Admitting: Psychiatry

## 2018-01-17 DIAGNOSIS — Z79899 Other long term (current) drug therapy: Secondary | ICD-10-CM

## 2018-01-17 DIAGNOSIS — F4323 Adjustment disorder with mixed anxiety and depressed mood: Secondary | ICD-10-CM

## 2018-01-17 MED ORDER — DULOXETINE HCL 30 MG PO CPEP: ORAL_CAPSULE | ORAL | 5 refills | Status: DC

## 2018-01-17 NOTE — Progress Notes (Signed)
[facility] MD Progress [facility]  01/17/2018 4:09 PM Kevin SizerJoshua Hall  MRN:  [id number] Subjective:  No real change doing okay Principal Problem: Adjustment disorder with anxious mood state Today the patient is actually doing better. His mood is improved. He claims a Cymbalta is very helpful. Back no longer really taking Klonopin very much at all. He takes oxycodone from his pain.Marland Kitchen. His son Cristal DeerChristopher is doing well ready to go back to school. Patient has a number of other physical problems but for the most part is improved. His biggest problem is that he walk. She's having a hard time the pain in his knees and legs. Recently he was awarded disability for his physical problems. The patient drinks no alcohol uses no drugs. He takes high-dose Neurontin 3000 mg from his primary care doctor. Patient says she sleeping and eating well has good energyand overall to be more positive. He no longer takes Lyrica. The patient lives with his 48-year-old son and his mother. Today were clear and seemed to be doing okay. Overall the patient is positive and more optimistic and seems to be better. . Total Time spent with patient: 15 minutes  Past Psychiatric History:   Past Medical History:  Past Medical History:  Diagnosis Date  . Achilles tendinitis   . Anxiety   . Back pain   . Depression   . Hypercholesteremia   . Shoulder dislocation     Past Surgical History:  Procedure Laterality Date  . HERNIA REPAIR    . KNEE ARTHROSCOPY    . KNEE ARTHROSCOPY Left 07/10/2017  . TONSILLECTOMY AND ADENOIDECTOMY     Family History:  Family History  Problem Relation Age of Onset  . Anxiety disorder Mother   . Depression Mother   . Diabetes Mother   . Hypertension Mother   . Thyroid disease Mother   . Cancer - Lung Father   . Alcohol abuse Father   . Cancer Father   . Bipolar disorder Brother   . Anxiety disorder Sister    Family Psychiatric  History:  Social History:  Social History   Substance and Sexual Activity   Alcohol Use No   Comment: a few beers every month      Social History   Substance and Sexual Activity  Drug Use No  . Types: Marijuana, LSD   Comment: Drug use twenty years ago     Social History   Socioeconomic History  . Marital status: Divorced    Spouse name: Not on file  . Number of children: Not on file  . Years of education: Not on file  . Highest education level: Not on file  Occupational History  . Not on file  Social Needs  . Financial resource strain: Not on file  . Food insecurity:    Worry: Not on file    Inability: Not on file  . Transportation needs:    Medical: Not on file    Non-medical: Not on file  Tobacco Use  . Smoking status: Current Every Day Smoker    Packs/day: 1.00    Years: 31.00    Pack years: 31.00    Types: Cigarettes  . Smokeless tobacco: Never Used  Substance and Sexual Activity  . Alcohol use: No    Comment: a few beers every month   . Drug use: No    Types: Marijuana, LSD    Comment: Drug use twenty years ago   . Sexual activity: Not Currently  Lifestyle  . Physical activity:  Days per week: Not on file    Minutes per session: Not on file  . Stress: Not on file  Relationships  . Social connections:    Talks on phone: Not on file    Gets together: Not on file    Attends religious service: Not on file    Active member of club or organization: Not on file    Attends meetings of clubs or organizations: Not on file    Relationship status: Not on file  Other Topics Concern  . Not on file  Social History Narrative  . Not on file   Additional Social History:                         Sleep: Good  Appetite:  Good  Current Medications: Current Outpatient Medications  Medication Sig Dispense Refill  . atorvastatin (LIPITOR) 40 MG tablet Take 40 mg by mouth daily.  5  . clonazePAM (KLONOPIN) 0.5 MG tablet Take 1 tablet (0.5 mg total) by mouth 2 (two) times daily as needed for anxiety. 60 tablet 3  .  cyclobenzaprine (FLEXERIL) 10 MG tablet Take 1 tablet (10 mg total) by mouth 3 (three) times daily as needed for muscle spasms. 15 tablet 0  . DULoxetine (CYMBALTA) 30 MG capsule 1  qam   For  1 week then  2  qam 60 capsule 5  . HYDROcodone-acetaminophen (NORCO/VICODIN) 5-325 MG tablet Take 1 tablet by mouth every 4 (four) hours as needed. (Patient not taking: Reported on 10/18/2017) 10 tablet 0  . lidocaine (XYLOCAINE) 5 % ointment Apply 1 application topically daily as needed for pain.  5  . methocarbamol (ROBAXIN) 500 MG tablet Take 500 mg by mouth 3 (three) times daily.    . OxyCODONE HCl, Abuse Deter, (OXAYDO) 5 MG TABA Take 5 mg by mouth every 4 (four) hours.    . predniSONE (DELTASONE) 50 MG tablet Take 1 tablet (50 mg total) by mouth daily. 5 tablet 0  . pregabalin (LYRICA) 150 MG capsule Take 150 mg by mouth 3 (three) times daily.    . ranitidine (RANITIDINE 150 MAX STRENGTH) 150 MG tablet Take 150 mg by mouth 2 (two) times daily.    . Rivaroxaban 15 & 20 MG TBPK Take as directed on package: Start with one 15mg  tablet by mouth twice a day with food. On Day 22, switch to one 20mg  tablet once a day with food. (Patient not taking: Reported on 10/18/2017) 51 each 0   No current facility-administered medications for this visit.     Lab Results: No results found for this or any previous visit (from the past 48 hour(s)).  Blood Alcohol level:  No results found for: [facility]  Physical Findings: AIMS:  , ,  ,  ,    CIWA:    COWS:     Musculoskeletal: Strength & Muscle Tone: within normal limits Gait & Station: normal Patient leans: N/A  Psychiatric Specialty Exam: ROS  Blood pressure 126/85, pulse 85, height 5\' 10"  (1.778 m), weight 195 lb 3.2 oz (88.5 kg).Body mass index is 28.01 kg/m.  General Appearance: Casual  Eye Contact::  Fair  Speech:  Clear and Coherent  Volume:  Normal  Mood:  Dysphoric  Affect:  Congruent  Thought Process:  Coherent  Orientation:  Full (Time, Place, and  Person)  Thought Content:  WDL  Suicidal Thoughts:  No  Homicidal Thoughts:  No  Memory:  NA  Judgement:  Fair  Insight:  Fair  Psychomotor Activity:  Normal  Concentration:  Fair  Recall:  Good  Fund of Knowledge:Fair  Language: Good   Akathisia:  No  Handed:  Right  AIMS (if indicated):     Assets:  Communication Skills  ADL's:  Intact  Cognition: WNL  Sleep:      Treatment Plan Summary: Today the patient's problems arehat he has an adjustment disorder with anxious mood state. He takes Klonopin at a very low dose. He takes 0.5 mg once a day but actually he rarely takes it. It's only 1 when necessary basis. He is much improved. His second problem is clinical depression. The patient is a minimal findings of clinical depression is more related pain. No significant pain is improved by taking Cymbalta 60 mg. The patient has had clinical depression in the past and I think giving him Cymbalta this time is good for prevention as well as her treatment of his depression. Hopefully will help all physical pain complaints. The patient served problem is that of substance abuse which sits distant problem. This is not active at all and more. He takes Neurontin 3000 mg which I suspect to help his pain but also help with anxiety perhaps help with his addiction. Overall the patient is here to significant improvement. Return to see me in 4 months.

## 2018-03-28 ENCOUNTER — Ambulatory Visit (HOSPITAL_COMMUNITY): Payer: Self-pay | Admitting: Psychiatry

## 2018-05-15 ENCOUNTER — Ambulatory Visit (INDEPENDENT_AMBULATORY_CARE_PROVIDER_SITE_OTHER): Payer: Medicaid Other | Admitting: Psychiatry

## 2018-05-15 ENCOUNTER — Encounter (HOSPITAL_COMMUNITY): Payer: Self-pay | Admitting: Psychiatry

## 2018-05-15 VITALS — BP 132/88 | Ht 69.0 in | Wt 192.4 lb

## 2018-05-15 DIAGNOSIS — F4323 Adjustment disorder with mixed anxiety and depressed mood: Secondary | ICD-10-CM | POA: Diagnosis not present

## 2018-05-15 MED ORDER — DULOXETINE HCL 60 MG PO CPEP: ORAL_CAPSULE | ORAL | 5 refills | Status: DC

## 2018-05-15 NOTE — Progress Notes (Signed)
[facility] MD Progress Note  05/15/2018 3:21 PM Kevin Hall  MRN:  [id number] Subjective:  No real change doing okay Principal Problem: Adjustment disorder with anxious mood state Today the patient is baseline.  He is a little bit scattered.  Apparently he is on prednisone for pain.  Patient takes a significant amount of oxycodone.  He takes 60 to 80 mg.  Today he denies being depressed.  He denies drinking alcohol.  He actually sleeping and eating very fairly well.  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  He takes Neurontin but is cut down the dose.  Patient has good energy.  His son is actually doing well and his mother is doing fairly well.  The patient is surviving fairly well dealing with pain.  It looks like he is not can have surgery.  He takes Klonopin rarely.  He takes Neurontin from his pain doctor.  At this time his major problem seems to be pain but it seems to be fairly well controlled with his pain doctor. . Total Time spent with patient: 15 minutes  Past Psychiatric History:   Past Medical History:  Past Medical History:  Diagnosis Date  . Achilles tendinitis   . Anxiety   . Back pain   . Depression   . Hypercholesteremia   . Shoulder dislocation     Past Surgical History:  Procedure Laterality Date  . HERNIA REPAIR    . KNEE ARTHROSCOPY    . KNEE ARTHROSCOPY Left 07/10/2017  . TONSILLECTOMY AND ADENOIDECTOMY     Family History:  Family History  Problem Relation Age of Onset  . Anxiety disorder Mother   . Depression Mother   . Diabetes Mother   . Hypertension Mother   . Thyroid disease Mother   . Cancer - Lung Father   . Alcohol abuse Father   . Cancer Father   . Bipolar disorder Brother   . Anxiety disorder Sister    Family Psychiatric  History:  Social History:  Social History   Substance and Sexual Activity  Alcohol Use No   Comment: a few beers every month      Social History   Substance and Sexual Activity  Drug Use  No  . Types: Marijuana, LSD   Comment: Drug use twenty years ago     Social History   Socioeconomic History  . Marital status: Divorced    Spouse name: Not on file  . Number of children: Not on file  . Years of education: Not on file  . Highest education level: Not on file  Occupational History  . Not on file  Social Needs  . Financial resource strain: Not on file  . Food insecurity:    Worry: Not on file    Inability: Not on file  . Transportation needs:    Medical: Not on file    Non-medical: Not on file  Tobacco Use  . Smoking status: Former Smoker    Packs/day: 1.00    Years: 31.00    Pack years: 31.00    Types: Cigarettes    Last attempt to quit: 09/26/2017    Years since quitting: 0.6  . Smokeless tobacco: Never Used  Substance and Sexual Activity  . Alcohol use: No    Comment: a few beers every month   . Drug use: No    Types: Marijuana, LSD    Comment: Drug use twenty years ago   . Sexual  activity: Not Currently  Lifestyle  . Physical activity:    Days per week: Not on file    Minutes per session: Not on file  . Stress: Not on file  Relationships  . Social connections:    Talks on phone: Not on file    Gets together: Not on file    Attends religious service: Not on file    Active member of club or organization: Not on file    Attends meetings of clubs or organizations: Not on file    Relationship status: Not on file  Other Topics Concern  . Not on file  Social History Narrative  . Not on file   Additional Social History:                         Sleep: Good  Appetite:  Good  Current Medications: Current Outpatient Medications  Medication Sig Dispense Refill  . atorvastatin (LIPITOR) 40 MG tablet Take 40 mg by mouth daily.  5  . clonazePAM (KLONOPIN) 0.5 MG tablet Take 1 tablet (0.5 mg total) by mouth 2 (two) times daily as needed for anxiety. 60 tablet 3  . DULoxetine (CYMBALTA) 60 MG capsule 1  qam 30 capsule 5  . gabapentin  (NEURONTIN) 600 MG tablet Take 600 mg by mouth 3 (three) times daily.    . methocarbamol (ROBAXIN) 500 MG tablet Take 500 mg by mouth 3 (three) times daily.    . OxyCODONE HCl, Abuse Deter, (OXAYDO) 5 MG TABA Take 5 mg by mouth every 4 (four) hours.    . ranitidine (RANITIDINE 150 MAX STRENGTH) 150 MG tablet Take 150 mg by mouth 2 (two) times daily.     No current facility-administered medications for this visit.     Lab Results: No results found for this or any previous visit (from the past 48 hour(s)).  Blood Alcohol level:  No results found for: [facility]  Physical Findings: AIMS:  , ,  ,  ,    CIWA:    COWS:     Musculoskeletal: Strength & Muscle Tone: within normal limits Gait & Station: normal Patient leans: N/A  Psychiatric Specialty Exam: ROS  Blood pressure 132/88, height 5\' 9"  (1.753 m), weight 192 lb 6.4 oz (87.3 kg).Body mass index is 28.41 kg/m.  General Appearance: Casual  Eye Contact::  Fair  Speech:  Clear and Coherent  Volume:  Normal  Mood:  Dysphoric  Affect:  Congruent  Thought Process:  Coherent  Orientation:  Full (Time, Place, and Person)  Thought Content:  WDL  Suicidal Thoughts:  No  Homicidal Thoughts:  No  Memory:  NA  Judgement:  Fair  Insight:  Fair  Psychomotor Activity:  Normal  Concentration:  Fair  Recall:  Good  Fund of Knowledge:Fair  Language: Good   Akathisia:  No  Handed:  Right  AIMS (if indicated):     Assets:  Communication Skills  ADL's:  Intact  Cognition: WNL  Sleep:      Treatment Plan Summary Today the patient seems to be stable.  He denies persistent depression.  He will continue taking Klonopin on a rare basis.  He will continue taking Cymbalta which keeps depression and his pain.  The patient denies any other type of pain.  He denies shortness of breath.  He denies any neurological symptoms at all.  He is not suicidal.  So his first problem seems to be of major depression which seems to be relatively  fully controlled  with Cymbalta.  His second problem is pain in the knees and pain clinic for this taking an opiate.  Patient today tries to discuss the possibility of using a stimulant but I declined.

## 2018-06-12 ENCOUNTER — Other Ambulatory Visit (HOSPITAL_COMMUNITY): Payer: Self-pay

## 2018-06-12 DIAGNOSIS — F4323 Adjustment disorder with mixed anxiety and depressed mood: Secondary | ICD-10-CM

## 2018-06-12 MED ORDER — CLONAZEPAM 0.5 MG PO TABS: 0.5000 mg | ORAL_TABLET | Freq: Two times a day (BID) | ORAL | 1 refills | Status: DC | PRN

## 2018-08-01 ENCOUNTER — Encounter: Payer: Self-pay | Admitting: Physical Therapy

## 2018-08-01 ENCOUNTER — Ambulatory Visit: Payer: Medicaid Other | Attending: Anesthesiology | Admitting: Physical Therapy

## 2018-08-01 ENCOUNTER — Other Ambulatory Visit: Payer: Self-pay

## 2018-08-01 DIAGNOSIS — G8929 Other chronic pain: Secondary | ICD-10-CM | POA: Insufficient documentation

## 2018-08-01 DIAGNOSIS — M545 Low back pain, unspecified: Secondary | ICD-10-CM

## 2018-08-01 DIAGNOSIS — M6281 Muscle weakness (generalized): Secondary | ICD-10-CM | POA: Insufficient documentation

## 2018-08-01 DIAGNOSIS — M542 Cervicalgia: Secondary | ICD-10-CM | POA: Diagnosis present

## 2018-08-01 NOTE — Patient Instructions (Signed)
Access Code: [email or web address]  URL: [email or web address]  Date: 08/01/2018  Prepared by: Lavinia Sharps   Exercises  Hooklying Transversus Abdominis Palpation - 10 reps - 1 sets - 1x daily - 7x weekly  Hooklying Isometric Hip Flexion - 10 reps - 1 sets - 1x daily - 7x weekly  Seated Transversus Abdominis Bracing - 10 reps - 1 sets - 1x daily - 7x weekly

## 2018-08-01 NOTE — Therapy (Signed)
[facility] [address], [address] [place], [place], [address] Phone: [phone]   Fax:  [phone]  Physical Therapy Evaluation  Patient Details  Name: Kevin Hall MRN: [id number] Date of Birth: [birth year] Referring Provider (PT): Dr Nilsa Nuttingakwa   Encounter Date: 08/01/2018  PT End of Session - 08/01/18 1617    Visit Number  1    Date for PT Re-Evaluation  09/26/18    Authorization Type  Medicaid will submit authorization     PT Start Time  1145    PT Stop Time  1230    PT Time Calculation (min)  45 min    Activity Tolerance  Patient tolerated treatment well       Past Medical History:  Diagnosis Date  . Achilles tendinitis   . Anxiety   . Back pain   . Depression   . Hypercholesteremia   . Shoulder dislocation     Past Surgical History:  Procedure Laterality Date  . HERNIA REPAIR    . KNEE ARTHROSCOPY    . KNEE ARTHROSCOPY Left 07/10/2017  . TONSILLECTOMY AND ADENOIDECTOMY      There were no vitals filed for this visit.   Subjective Assessment - 08/01/18 1209    Subjective  The patient has a long history of neck and back pain.  He reports that last year the pain worsened in his back and leg and had to use a rolling walker and motorized scooter.  He reports he improved after an injection to his lumbar spine.  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  He states he has been told he needs back surgery but wants to avoid if possible and will be getting an injection to his neck soon.      Patient is accompained by:  Family member   49 year old son   Pertinent History  Bipolar, depression, chronic pain shoulders, knee, neck, back     Limitations  Walking;Lifting;Standing    How long can you walk comfortably?  1/4 mile with walker     Diagnostic tests  MRI lumbar HNP, pinched nerve    Patient Stated Goals  "gain knowledge"  "see what you think of exercise routine"      Currently in Pain?  Yes    Pain Score  6     Pain Location  Neck    Pain Orientation  Right;Left    Pain Type  Chronic pain    Pain Relieving Factors  medication    Multiple Pain Sites  Yes    Pain Score  6    Pain Location  Back    Pain Orientation  Lower    Pain Type  Chronic pain    Pain Frequency  Constant    Aggravating Factors   sit, playing guitar    Pain Relieving Factors  meds, heat, ice         [facility] PT Assessment - 08/01/18 0001      Assessment   Medical Diagnosis  cervicalgia; lumbago     Referring Provider (PT)  Dr Cleon Gustinwaka    Onset Date/Surgical Date  --   > 6 months   Next MD Visit  2 weeks    Prior Therapy  for knee       Precautions   Precautions  None      Restrictions   Weight Bearing Restrictions  No      Balance Screen  Has the patient fallen in the past 6 months  No    Has the patient had a decrease in activity level because of a fear of falling?   No    Is the patient reluctant to leave their home because of a fear of falling?   No      Home Environment   Living Environment  Assisted living      Prior Function   Level of Independence  Independent with basic ADLs    Vocation  Unemployed    Leisure  taking care of son, used to play golf       AROM   AROM Assessment Site  --   demonstrates full external rotation of hips   Cervical Flexion  34    Cervical Extension  45    Cervical - Right Side Bend  34    Cervical - Left Side Bend  20    Cervical - Right Rotation  40    Cervical - Left Rotation  40    Lumbar Flexion  60    Lumbar Extension  10    Lumbar - Right Side Bend  35    Lumbar - Left Side Bend  40      Strength   Right/Left Hip  Right;Left    Right Hip Flexion  4/5    Right Hip ABduction  4/5    Left Hip Flexion  4/5    Left Hip ABduction  4/5    Cervical Flexion  3+/5    Cervical Extension  3+/5    Lumbar Flexion  3+/5    Lumbar Extension  3+/5      Flexibility   Soft Tissue Assessment /Muscle Length  yes     Hamstrings  90      Palpation   Palpation comment  upper trap tender points      Straight Leg Raise   Findings  Negative                Objective measurements completed on examination: See above findings.              PT Education - 08/01/18 1224    Education Details   Access Code: [email or web address] abdominal brace in sitting and supine;  hand to knee push supine    Person(s) Educated  Patient    Methods  Explanation;Demonstration;Handout    Comprehension  Returned demonstration;Verbalized understanding          PT Long Term Goals - 08/01/18 1629      PT LONG TERM GOAL #1   Title  independent with HEP and understand how to progress    Time  8    Period  Weeks    Status  New    Target Date  09/26/18      PT LONG TERM GOAL #2   Title  The patient will have improved cervical flexion to 40 degrees, sidebending to 35 degrees and rotation to 45 degrees needed for driving    Time  8    Period  Weeks    Status  New      PT LONG TERM GOAL #3   Title  The patient will have improved lumbo/pelvic/hip core strength to 4-/5 needed for walking/standing longer periods of time    Time  8    Period  Weeks    Status  New      PT LONG TERM GOAL #4   Title  The patient will have cervical  muscular strength to grossly 4-/5 needed for ADLs    Time  8    Period  Weeks    Status  New      PT LONG TERM GOAL #5   Title  The patient will be able to ambulate with assistive device as needed for 1/2 mile for community ambulation     Time  8    Period  Weeks    Status  New             Plan - 08/01/18 1618    Clinical Impression Statement  The patient has a long history of neck and back pain.  He reports that last year the pain worsened in his back and leg and had to use a rolling walker and motorized scooter.  He reports he improved after an injection to his lumbar spine.  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  He states he  has been told he needs back surgery but wants to avoid if possible and will be getting an injection to his neck soon.  He demonstrates decreased cervical and lumbar ROM and painful in all planes.  Decreased deep cervical flexor strength and transverse abdominus muscle activation.  He demonstrates his current ex program which is mostly LE stretching using his yoga strap.  He lacks knowledge of appropriate strengthening ex's particularly for his core muscles.  He would benefit from exercise instruction to help him choose the most effective ex's for best long term outcomes.      History and Personal Factors relevant to plan of care:  psychosocial issues (single parent) and helps care for his aging mother;  expresses financial concerns with not working;  chronic pain    Clinical Presentation  Evolving    Clinical Presentation due to:  multiple body regions and systems affected;  neck and back symptoms have worsened over time    Rehab Potential  Good    Clinical Impairments Affecting Rehab Potential  chronic shoulder, knee pain; bipolar    PT Frequency  2x / week    PT Duration  8 weeks    PT Treatment/Interventions  ADLs/Self Care Home Management;Cryotherapy;Electrical Stimulation;Moist Heat;Traction;Ultrasound;Therapeutic exercise;Therapeutic activities;Patient/family education;Neuromuscular re-education;Manual techniques;Taping;Dry needling    PT Next Visit Plan  try ES;  review core abdominal brace, add neck isometrics and clams to HEP     PT Home Exercise Plan  abdominal brace  Access Code: [email or web address] abdominal brace in sitting and supine;  hand to knee push supine    Consulted and Agree with Plan of Care  Patient       Patient will benefit from skilled therapeutic intervention in order to improve the following deficits and impairments:  Pain, Increased muscle spasms, Decreased activity tolerance, Decreased range of motion, Decreased strength, Impaired perceived functional ability, Difficulty  walking  Visit Diagnosis: Cervicalgia - Plan: PT plan of care cert/re-cert  Chronic bilateral low back pain, unspecified whether sciatica present - Plan: PT plan of care cert/re-cert  Muscle weakness (generalized) - Plan: PT plan of care cert/re-cert     Problem List Patient Active Problem List   Diagnosis Date Noted  . Acute pain of left knee 07/18/2016  . Generalized anxiety disorder 02/08/2016  . Achilles tendon disorder 10/14/2014  . Low back pain 01/09/2014  . HLD (hyperlipidemia) 01/09/2014  . Health care maintenance 12/29/2013  . Tobacco abuse 12/29/2013  . Sciatica 12/29/2013  . Adjustment disorder with mixed anxiety and  depressed mood 11/06/2012  . Attention deficit disorder (ADD), child, with hyperactivity 07/17/2012   Lavinia Sharps, PT 08/01/18 4:35 PM Phone: [phone] Fax: [phone] Vivien Presto 08/01/2018, 4:34 PM  [facility] [address] [place], [place], [address] Phone: [phone]   Fax:  [phone]  Name: Kevin Hall MRN: [id number] Date of Birth: 1970-08-29

## 2018-08-05 ENCOUNTER — Telehealth (HOSPITAL_COMMUNITY): Payer: Self-pay | Admitting: Psychology

## 2018-08-07 ENCOUNTER — Ambulatory Visit: Payer: Medicaid Other

## 2018-08-07 DIAGNOSIS — M545 Low back pain, unspecified: Secondary | ICD-10-CM

## 2018-08-07 DIAGNOSIS — M6281 Muscle weakness (generalized): Secondary | ICD-10-CM

## 2018-08-07 DIAGNOSIS — M542 Cervicalgia: Secondary | ICD-10-CM

## 2018-08-07 DIAGNOSIS — G8929 Other chronic pain: Secondary | ICD-10-CM

## 2018-08-07 NOTE — Therapy (Signed)
[facility] [address], [address] [place], [place], [address] Phone: [phone]   Fax:  [phone]  Physical Therapy Treatment  Patient Details  Name: Kevin Hall MRN: [id number] Date of Birth: 12/04/1970 Referring Provider (PT): Dr Cleon Gustin   Encounter Date: 08/07/2018  PT End of Session - 08/07/18 1103    Visit Number  2    Date for PT Re-Evaluation  09/26/18    Authorization Type  Medicaid 3 visits approved 08/06/18-08/26/18    Authorization - Visit Number  1    Authorization - Number of Visits  3    PT Start Time  1020    PT Stop Time  1102    PT Time Calculation (min)  42 min    Activity Tolerance  Patient tolerated treatment well    Behavior During Therapy  Restless;Anxious;Impulsive       Past Medical History:  Diagnosis Date  . Achilles tendinitis   . Anxiety   . Back pain   . Depression   . Hypercholesteremia   . Shoulder dislocation     Past Surgical History:  Procedure Laterality Date  . HERNIA REPAIR    . KNEE ARTHROSCOPY    . KNEE ARTHROSCOPY Left 07/10/2017  . TONSILLECTOMY AND ADENOIDECTOMY      There were no vitals filed for this visit.  Subjective Assessment - 08/07/18 1022    Subjective  I am stiff this morning.      Pertinent History  Bipolar, depression, chronic pain shoulders, knee, neck, back     Patient Stated Goals  "gain knowledge"  "see what you think of exercise routine"     Currently in Pain?  Yes    Pain Score  5     Pain Location  Neck    Pain Orientation  Right;Left    Pain Type  Chronic pain    Pain Onset  More than a month ago    Pain Frequency  Constant    Aggravating Factors   early morning, I just hurt    Pain Relieving Factors  medication    Pain Score  5    Pain Location  Back    Pain Orientation  Lower    Pain Type  Chronic pain    Pain Onset  More than a month ago    Pain Frequency  Constant    Aggravating Factors   sit, playing guitar    Pain Relieving  Factors  meds, heat, ice                       [facility] Adult PT Treatment/Exercise - 08/07/18 0001      Exercises   Exercises  Knee/Hip;Neck;Lumbar      Lumbar Exercises: Stretches   Lower Trunk Rotation  3 reps;20 seconds      Lumbar Exercises: Supine   Ab Set  10 reps;5 seconds    Other Supine Lumbar Exercises  isometric hip flexion x10      Lumbar Exercises: Sidelying   Other Sidelying Lumbar Exercises  attempted open book stretch- pt not able to follow insructions for exercise       Knee/Hip Exercises: Stretches   Active Hamstring Stretch  Both;2 reps;20 seconds    Piriformis Stretch  Both;2 reps;20 seconds      Knee/Hip Exercises: Aerobic   Nustep  Level 2x 8 minutes   PT present to discuss progress- slow pace     Neck Exercises: Stretches  Other Neck Stretches  cervical flexibility 3 ways 3x20 seconds                   PT Long Term Goals - 08/01/18 1629      PT LONG TERM GOAL #1   Title  independent with HEP and understand how to progress    Time  8    Period  Weeks    Status  New    Target Date  09/26/18      PT LONG TERM GOAL #2   Title  The patient will have improved cervical flexion to 40 degrees, sidebending to 35 degrees and rotation to 45 degrees needed for driving    Time  8    Period  Weeks    Status  New      PT LONG TERM GOAL #3   Title  The patient will have improved lumbo/pelvic/hip core strength to 4-/5 needed for walking/standing longer periods of time    Time  8    Period  Weeks    Status  New      PT LONG TERM GOAL #4   Title  The patient will have cervical muscular strength to grossly 4-/5 needed for ADLs    Time  8    Period  Weeks    Status  New      PT LONG TERM GOAL #5   Title  The patient will be able to ambulate with assistive device as needed for 1/2 mile for community ambulation     Time  8    Period  Weeks    Status  New            Plan - 08/07/18 1043    Clinical Impression Statement   Today is first time follow-up after evaluation.  Pt required max verbal cueing for redirection during treatment session today and max tactile/demo cues for technique.  Further instruction will be needed before adding new exercises to HEP.  PT educated pt regarding need for gentle and slow motion for flexibility and to relax during stretching.  Pt with chronic and widespread pain and will continue to benefit from skilled PT for gentle flexibility, strength and modalities as needed.      Rehab Potential  Good    Clinical Impairments Affecting Rehab Potential  chronic shoulder, knee pain; bipolar    PT Frequency  2x / week    PT Duration  8 weeks    PT Treatment/Interventions  ADLs/Self Care Home Management;Cryotherapy;Electrical Stimulation;Moist Heat;Traction;Ultrasound;Therapeutic exercise;Therapeutic activities;Patient/family education;Neuromuscular re-education;Manual techniques;Taping;Dry needling    PT Next Visit Plan  add to HEP as pt reports consistency with current HEP, gentle mobility    Consulted and Agree with Plan of Care  Patient       Patient will benefit from skilled therapeutic intervention in order to improve the following deficits and impairments:  Pain, Increased muscle spasms, Decreased activity tolerance, Decreased range of motion, Decreased strength, Impaired perceived functional ability, Difficulty walking  Visit Diagnosis: Cervicalgia  Chronic bilateral low back pain, unspecified whether sciatica present  Muscle weakness (generalized)     Problem List Patient Active Problem List   Diagnosis Date Noted  . Acute pain of left knee 07/18/2016  . Generalized anxiety disorder 02/08/2016  . Achilles tendon disorder 10/14/2014  . Low back pain 01/09/2014  . HLD (hyperlipidemia) 01/09/2014  . Health care maintenance 12/29/2013  . Tobacco abuse 12/29/2013  . Sciatica 12/29/2013  . Adjustment disorder with mixed anxiety  and depressed mood 11/06/2012  . Attention deficit  disorder (ADD), child, with hyperactivity 07/17/2012    Lorrene Reid, PT 08/07/18 11:05 AM  [facility] [address], [address] [place], [place], [address] Phone: [phone]   Fax:  [phone]  Name: Jarreth Sidberry MRN: [id number] Date of Birth: June 02, 1971

## 2018-08-20 ENCOUNTER — Ambulatory Visit: Payer: Medicaid Other | Admitting: Physical Therapy

## 2018-08-26 ENCOUNTER — Ambulatory Visit: Payer: Medicaid Other | Attending: Anesthesiology

## 2018-08-26 DIAGNOSIS — M542 Cervicalgia: Secondary | ICD-10-CM | POA: Diagnosis present

## 2018-08-26 DIAGNOSIS — G8929 Other chronic pain: Secondary | ICD-10-CM | POA: Insufficient documentation

## 2018-08-26 DIAGNOSIS — M545 Low back pain, unspecified: Secondary | ICD-10-CM

## 2018-08-26 DIAGNOSIS — M6281 Muscle weakness (generalized): Secondary | ICD-10-CM | POA: Insufficient documentation

## 2018-08-26 NOTE — Therapy (Addendum)
[facility] [address], [place], [place], [address] Phone: [phone]   Fax:  [phone]  Physical Therapy Treatment  Patient Details  Name: Kevin Hall MRN: [id number] Date of Birth: [birth year] Referring Provider (PT): Dr Feliz Beam   Encounter Date: 08/26/2018  PT End of Session - 08/26/18 1258    Visit Number  3    Date for PT Re-Evaluation  09/26/18    Authorization Type  Medicaid 3 visits approved 08/06/18-08/26/18    Authorization - Visit Number  2    Authorization - Number of Visits  3    PT Start Time  6962    PT Stop Time  1255    PT Time Calculation (min)  25 min    Activity Tolerance  Patient tolerated treatment well    Behavior During Therapy  Restless;Anxious;Impulsive       Past Medical History:  Diagnosis Date  . Achilles tendinitis   . Anxiety   . Back pain   . Depression   . Hypercholesteremia   . Shoulder dislocation     Past Surgical History:  Procedure Laterality Date  . HERNIA REPAIR    . KNEE ARTHROSCOPY    . KNEE ARTHROSCOPY Left 07/10/2017  . TONSILLECTOMY AND ADENOIDECTOMY      There were no vitals filed for this visit.  Subjective Assessment - 08/26/18 1231    Subjective  I am tired.  I don't want to come to PT anymore, I find it unnecessary.      Pertinent History  Bipolar, depression, chronic pain shoulders, knee, neck, back     Patient Stated Goals  "gain knowledge"  "see what you think of exercise routine"     Currently in Pain?  No/denies   just uncomfortable        [facility] PT Assessment - 08/26/18 0001      Assessment   Medical Diagnosis  cervicalgia; lumbago       Home Environment   Living Environment  Assisted living      Prior Function   Level of Independence  Independent with basic ADLs    Vocation  Unemployed    Leisure  taking care of son, used to play golf       AROM   Cervical Flexion  48    Cervical Extension  45    Cervical - Right Side Bend  35     Cervical - Left Side Bend  30    Cervical - Right Rotation  75    Cervical - Left Rotation  85                   [facility] Adult PT Treatment/Exercise - 08/26/18 0001      Knee/Hip Exercises: Aerobic   Nustep  Level 2x 8 minutes   PT present to discuss progress- slow pace     Neck Exercises: Stretches   Other Neck Stretches  cervical flexibility 3 ways 3x20 seconds                   PT Long Term Goals - 08/26/18 1234      PT LONG TERM GOAL #1   Title  independent with HEP and understand how to progress    Baseline  Pt does abdominal bracing    Status  Partially Met      PT LONG TERM GOAL #2   Title  The patient will have improved cervical flexion to 40 degrees, sidebending to  35 degrees and rotation to 45 degrees needed for driving    Baseline  met except Lt sidebending 30 degrees    Status  Partially Met      PT LONG TERM GOAL #3   Title  The patient will have improved lumbo/pelvic/hip core strength to 4-/5 needed for walking/standing longer periods of time    Baseline  no change    Status  Not Met      PT LONG TERM GOAL #4   Title  The patient will have cervical muscular strength to grossly 4-/5 needed for ADLs    Baseline  no change    Status  Not Met      PT LONG TERM GOAL #5   Title  The patient will be able to ambulate with assistive device as needed for 1/2 mile for community ambulation     Baseline  able to walk last week when car in the shop- no assistive device    Status  Achieved            Plan - 08/26/18 1255    Clinical Impression Statement  Pt requests to discharge from PT today.  Pt has been performing abdominal isometric exercises at home and reports that he does stretching. Pt did not want any additional exercises.  No change in core or neck strength due to limited attendance.  Pt has chronic pain and denies significant pain over the past few weeks due to limiting activity.  Pt with improved cervical A/ROM today.  Pt will D/C  today and follow-up with MD as needed.      PT Next Visit Plan  D/C PT to HEP per pt request    Consulted and Agree with Plan of Care  Patient       Patient will benefit from skilled therapeutic intervention in order to improve the following deficits and impairments:     Visit Diagnosis: Chronic bilateral low back pain, unspecified whether sciatica present  Cervicalgia  Muscle weakness (generalized)     Problem List Patient Active Problem List   Diagnosis Date Noted  . Acute pain of left knee 07/18/2016  . Generalized anxiety disorder 02/08/2016  . Achilles tendon disorder 10/14/2014  . Low back pain 01/09/2014  . HLD (hyperlipidemia) 01/09/2014  . Health care maintenance 12/29/2013  . Tobacco abuse 12/29/2013  . Sciatica 12/29/2013  . Adjustment disorder with mixed anxiety and depressed mood 11/06/2012  . 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     Remaining deficits: See above.  Pt requested D/C to HEP.   Education / Equipment: HEP Plan: Patient agrees to discharge.  Patient goals were not met. Patient is being discharged due to the patient's request.  ?????         Kevin Hall, PT 08/26/18 1:00 PM  [facility] [address], [place], [place], [address] Phone: [phone]   Fax:  [phone]  Name: Kevin Hall MRN: [id number] Date of Birth: 1970-10-17

## 2018-09-13 ENCOUNTER — Encounter (HOSPITAL_COMMUNITY): Payer: Self-pay | Admitting: Psychiatry

## 2018-09-13 ENCOUNTER — Other Ambulatory Visit: Payer: Self-pay

## 2018-09-13 ENCOUNTER — Ambulatory Visit (INDEPENDENT_AMBULATORY_CARE_PROVIDER_SITE_OTHER): Payer: Medicaid Other | Admitting: Psychiatry

## 2018-09-13 VITALS — BP 110/76 | HR 96 | Temp 98.3°F | Ht 69.0 in | Wt 199.0 lb

## 2018-09-13 DIAGNOSIS — F4323 Adjustment disorder with mixed anxiety and depressed mood: Secondary | ICD-10-CM

## 2018-09-13 DIAGNOSIS — F4321 Adjustment disorder with depressed mood: Secondary | ICD-10-CM

## 2018-09-13 MED ORDER — CLONAZEPAM 0.5 MG PO TABS: 0.5000 mg | ORAL_TABLET | Freq: Two times a day (BID) | ORAL | 3 refills | Status: DC | PRN

## 2018-09-13 MED ORDER — DULOXETINE HCL 60 MG PO CPEP: ORAL_CAPSULE | ORAL | 5 refills | Status: DC

## 2018-09-13 NOTE — Progress Notes (Signed)
[facility] Progress Note  09/13/2018 12:06 PM Kevin Hall  MRN:  [id number] Subjective:  No real change doing okay Principal Problem: Adjustment disorder with anxious mood state  Today the patient is actually doing better.  He is made some big decisions and some good things that happen.  First he got disability.  Second he got back his driver's license and got a car and was driving.  He also has discontinued on his own his oxycodone.  Believes this is what he says that he stopped not his other doctors.  He does not seem to have as much leg pain.  Is got a good primary care physician who is involved with him.  His son is in second grade and doing fairly well.  The patient is sleeping and eating well.  He denies daily depression.  His anxiety is fairly well controlled.  He is taking a fixed low-dose of Klonopin 0.5 mg twice daily which is reasonable.  He takes Cymbalta 60 mg on a regular basis.  He is planning to guitar more.  He is being more productive.  He feels more stable.  He feels like he has more meeting.  He is in a good mood state.  He seems less anxious. . Total Time spent with patient: 15 minutes  Past Psychiatric History:   Past Medical History:  Past Medical History:  Diagnosis Date  . Achilles tendinitis   . Anxiety   . Back pain   . Depression   . Hypercholesteremia   . Shoulder dislocation     Past Surgical History:  Procedure Laterality Date  . HERNIA REPAIR    . KNEE ARTHROSCOPY    . KNEE ARTHROSCOPY Left 07/10/2017  . TONSILLECTOMY AND ADENOIDECTOMY     Family History:  Family History  Problem Relation Age of Onset  . Anxiety disorder Mother   . Depression Mother   . Diabetes Mother   . Hypertension Mother   . Thyroid disease Mother   . Cancer - Lung Father   . Alcohol abuse Father   . Cancer Father   . Bipolar disorder Brother   . Anxiety disorder Sister    Family Psychiatric  History:  Social History:  Social History   Substance and Sexual Activity   Alcohol Use No   Comment: a few beers every month      Social History   Substance and Sexual Activity  Drug Use No  . Types: Marijuana, LSD   Comment: Drug use twenty years ago     Social History   Socioeconomic History  . Marital status: Divorced    Spouse name: Not on file  . Number of children: Not on file  . Years of education: Not on file  . Highest education level: Not on file  Occupational History  . Not on file  Social Needs  . Financial resource strain: Not on file  . Food insecurity:    Worry: Not on file    Inability: Not on file  . Transportation needs:    Medical: Not on file    Non-medical: Not on file  Tobacco Use  . Smoking status: Former Smoker    Packs/day: 1.00    Years: 31.00    Pack years: 31.00    Types: Cigarettes    Last attempt to quit: 09/26/2017    Years since quitting: 0.9  . Smokeless tobacco: Never Used  Substance and Sexual Activity  . Alcohol use: No    Comment: a few  beers every month   . Drug use: No    Types: Marijuana, LSD    Comment: Drug use twenty years ago   . Sexual activity: Not Currently  Lifestyle  . Physical activity:    Days per week: Not on file    Minutes per session: Not on file  . Stress: Not on file  Relationships  . Social connections:    Talks on phone: Not on file    Gets together: Not on file    Attends religious service: Not on file    Active member of club or organization: Not on file    Attends meetings of clubs or organizations: Not on file    Relationship status: Not on file  Other Topics Concern  . Not on file  Social History Narrative  . Not on file   Additional Social History:                         Sleep: Good  Appetite:  Good  Current Medications: Current Outpatient Medications  Medication Sig Dispense Refill  . atorvastatin (LIPITOR) 40 MG tablet Take 40 mg by mouth daily.  5  . clonazePAM (KLONOPIN) 0.5 MG tablet Take 1 tablet (0.5 mg total) by mouth 2 (two) times  daily as needed for anxiety. 60 tablet 3  . DULoxetine (CYMBALTA) 60 MG capsule 1  qam 30 capsule 5  . gabapentin (NEURONTIN) 600 MG tablet Take 600 mg by mouth 3 (three) times daily.    . methocarbamol (ROBAXIN) 500 MG tablet Take 500 mg by mouth 3 (three) times daily.    . ranitidine (RANITIDINE 150 MAX STRENGTH) 150 MG tablet Take 150 mg by mouth 2 (two) times daily.    . OxyCODONE HCl, Abuse Deter, (OXAYDO) 5 MG TABA Take 5 mg by mouth every 4 (four) hours.     No current facility-administered medications for this visit.     Lab Results: No results found for this or any previous visit (from the past 48 hour(s)).  Blood Alcohol level:  No results found for: [facility]  Physical Findings: AIMS:  , ,  ,  ,    CIWA:    COWS:     Musculoskeletal: Strength & Muscle Tone: within normal limits Gait & Station: normal Patient leans: N/A  Psychiatric Specialty Exam: ROS  Blood pressure 110/76, pulse 96, temperature 98.3 F (36.8 C), height  (1.753 m), weight 199 lb (90.3 kg).Body mass index is 29.39 kg/m.  General Appearance: Casual  Eye Contact::  Fair  Speech:  Clear and Coherent  Volume:  Normal  Mood:  Dysphoric  Affect:  Congruent  Thought Process:  Coherent  Orientation:  Full (Time, Place, and Person)  Thought Content:  WDL  Suicidal Thoughts:  No  Homicidal Thoughts:  No  Memory:  NA  Judgement:  Fair  Insight:  Fair  Psychomotor Activity:  Normal  Concentration:  Fair  Recall:  Good  Fund of Knowledge:Fair  Language: Good   Akathisia:  No  Handed:  Right  AIMS (if indicated):     Assets:  Communication Skills  ADL's:  Intact  Cognition: WNL  Sleep:      Treatment Plan Summary  This patient's diagnosis is that of major clinical depression and he takes Cymbalta for this.  He also takes Klonopin 0.5 mg twice daily.  He is actually stable.  He is functioning much better.  He denies any use of drugs or alcohol.  He has no evidence of psychosis.  He is functioning  much better in terms of doing more actions and activities.  I suspect he will start looking for a job if he is part-time.  His son is stable.  He will return to see me in 4 months.

## 2018-12-16 ENCOUNTER — Telehealth (HOSPITAL_COMMUNITY): Payer: Self-pay

## 2018-12-16 NOTE — Telephone Encounter (Signed)
Patient is calling to see if you can increase his Klonopin slightly. Patient has cut back on his Gabapentin and in noticing an increase in his anxiety. Please review and advise, thank you

## 2018-12-20 NOTE — Telephone Encounter (Signed)
I informed patient that Dr. Casimiro Needle will not increase his Klonopin at this time. Patient requested an earlier appointment to discuss and I scheduled him for next week 6/30

## 2018-12-24 ENCOUNTER — Other Ambulatory Visit: Payer: Self-pay

## 2018-12-24 ENCOUNTER — Ambulatory Visit (INDEPENDENT_AMBULATORY_CARE_PROVIDER_SITE_OTHER): Payer: Medicaid Other | Admitting: Psychiatry

## 2018-12-24 DIAGNOSIS — F4323 Adjustment disorder with mixed anxiety and depressed mood: Secondary | ICD-10-CM

## 2018-12-24 DIAGNOSIS — F325 Major depressive disorder, single episode, in full remission: Secondary | ICD-10-CM

## 2018-12-24 DIAGNOSIS — F4321 Adjustment disorder with depressed mood: Secondary | ICD-10-CM | POA: Diagnosis not present

## 2018-12-24 MED ORDER — CLONAZEPAM 0.5 MG PO TABS: ORAL_TABLET | ORAL | 4 refills | Status: DC

## 2018-12-24 MED ORDER — DULOXETINE HCL 60 MG PO CPEP: ORAL_CAPSULE | ORAL | 5 refills | Status: DC

## 2018-12-24 MED ORDER — CLONAZEPAM 0.5 MG PO TABS: 0.5000 mg | ORAL_TABLET | Freq: Two times a day (BID) | ORAL | 4 refills | Status: DC | PRN
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
## 2019-01-15 ENCOUNTER — Ambulatory Visit (HOSPITAL_COMMUNITY): Payer: Medicaid Other | Admitting: Psychiatry

## 2019-02-28 ENCOUNTER — Other Ambulatory Visit: Payer: Self-pay

## 2019-02-28 ENCOUNTER — Ambulatory Visit (INDEPENDENT_AMBULATORY_CARE_PROVIDER_SITE_OTHER): Payer: Medicaid Other | Admitting: Psychiatry

## 2019-02-28 DIAGNOSIS — F4321 Adjustment disorder with depressed mood: Secondary | ICD-10-CM

## 2019-02-28 DIAGNOSIS — F4323 Adjustment disorder with mixed anxiety and depressed mood: Secondary | ICD-10-CM | POA: Diagnosis not present

## 2019-02-28 MED ORDER — CLONAZEPAM 0.5 MG PO TABS: ORAL_TABLET | ORAL | 4 refills | Status: DC

## 2019-02-28 MED ORDER — DULOXETINE HCL 60 MG PO CPEP: ORAL_CAPSULE | ORAL | 5 refills | Status: DC

## 2019-02-28 NOTE — Progress Notes (Signed)
[facility]  02/28/2019 9:50 AM Kevin SizerJoshua Bergman  MRN:  [id number] Subjective:  No real change doing okay Principal Problem: Adjustment disorder with anxious mood state   Today the patient is doing very well.  He is taking care of his son who now is getting treated for attention deficit disorder.  The patient is active.  He is still driving.  The patient has mild pain but is tolerating it.  Physically he is stable.  He denies chest pain shortness of breath or any respiratory symptoms.  His mood is well controlled.  He denies daily depression.  He denies anhedonia.  He sleeps and eats well has good energy.  He denies use of alcohol or drugs.  He stopped smoking cigarettes and as result he has gained some weight.  He does not exercise very much.  Recently had a big family events and enjoyed it.  He does enjoy things.  He has no romantic relationships at this time.  The patient is functioning very well.  He is a very dedicated father.. Total Time spent with patient: 15 minutes  Past Psychiatric History:   Past Medical History:  Past Medical History:  Diagnosis Date  . Achilles tendinitis   . Anxiety   . Back pain   . Depression   . Hypercholesteremia   . Shoulder dislocation     Past Surgical History:  Procedure Laterality Date  . HERNIA REPAIR    . KNEE ARTHROSCOPY    . KNEE ARTHROSCOPY Left 07/10/2017  . TONSILLECTOMY AND ADENOIDECTOMY     Family History:  Family History  Problem Relation Age of Onset  . Anxiety disorder Mother   . Depression Mother   . Diabetes Mother   . Hypertension Mother   . Thyroid disease Mother   . Cancer - Lung Father   . Alcohol abuse Father   . Cancer Father   . Bipolar disorder Brother   . Anxiety disorder Sister    Family Psychiatric  History:  Social History:  Social History   Substance and Sexual Activity  Alcohol Use No   Comment: a few beers every month      Social History   Substance and Sexual Activity  Drug Use No  .  Types: Marijuana, LSD   Comment: Drug use twenty years ago     Social History   Socioeconomic History  . Marital status: Divorced    Spouse name: Not on file  . Number of children: Not on file  . Years of education: Not on file  . Highest education level: Not on file  Occupational History  . Not on file  Social Needs  . Financial resource strain: Not on file  . Food insecurity    Worry: Not on file    Inability: Not on file  . Transportation needs    Medical: Not on file    Non-medical: Not on file  Tobacco Use  . Smoking status: Former Smoker    Packs/day: 1.00    Years: 31.00    Pack years: 31.00    Types: Cigarettes    Quit date: 09/26/2017    Years since quitting: 1.4  . Smokeless tobacco: Never Used  Substance and Sexual Activity  . Alcohol use: No    Comment: a few beers every month   . Drug use: No    Types: Marijuana, LSD    Comment: Drug use twenty years ago   . Sexual activity: Not Currently  Lifestyle  .  Physical activity    Days per week: Not on file    Minutes per session: Not on file  . Stress: Not on file  Relationships  . Social Herbalist on phone: Not on file    Gets together: Not on file    Attends religious service: Not on file    Active member of club or organization: Not on file    Attends meetings of clubs or organizations: Not on file    Relationship status: Not on file  Other Topics Concern  . Not on file  Social History Narrative  . Not on file   Additional Social History:                         Sleep: Good  Appetite:  Good  Current Medications: Current Outpatient Medications  Medication Sig Dispense Refill  . atorvastatin (LIPITOR) 40 MG tablet Take 40 mg by mouth daily.  5  . clonazePAM (KLONOPIN) 0.5 MG tablet 1 bid 60 tablet 4  . DULoxetine (CYMBALTA) 60 MG capsule 1  qam 30 capsule 5  . gabapentin (NEURONTIN) 600 MG tablet Take 600 mg by mouth 3 (three) times daily.    . methocarbamol (ROBAXIN) 500  MG tablet Take 500 mg by mouth 3 (three) times daily.    . OxyCODONE HCl, Abuse Deter, (OXAYDO) 5 MG TABA Take 5 mg by mouth every 4 (four) hours.    . ranitidine (RANITIDINE 150 MAX STRENGTH) 150 MG tablet Take 150 mg by mouth 2 (two) times daily.     No current facility-administered medications for this visit.     Lab Results: No results found for this or any previous visit (from the past 48 hour(s)).  Blood Alcohol level:  No results found for: [facility]  Physical Findings: AIMS:  , ,  ,  ,    CIWA:    COWS:     Musculoskeletal: Strength & Muscle Tone: within normal limits Gait & Station: normal Patient leans: N/A  Psychiatric Specialty Exam: ROS  There were no vitals taken for this visit.There is no height or weight on file to calculate BMI.  General Appearance: Casual  Eye Contact::  Fair  Speech:  Clear and Coherent  Volume:  Normal  Mood:  Dysphoric  Affect:  Congruent  Thought Process:  Coherent  Orientation:  Full (Time, Place, and Person)  Thought Content:  WDL  Suicidal Thoughts:  No  Homicidal Thoughts:  No  Memory:  NA  Judgement:  Fair  Insight:  Fair  Psychomotor Activity:  Normal  Concentration:  Fair  Recall:  Good  Fund of Knowledge:Fair  Language: Good   Akathisia:  No  Handed:  Right  AIMS (if indicated):     Assets:  Communication Skills  ADL's:  Intact  Cognition: WNL  Sleep:      Treatment Plan Summary  At this time the patient is #1 problem is major depression.  He takes Cymbalta and does very well.  He is not in therapy at this time.  We will also continue his Klonopin as ordered.  The patient is functioning very well.  We will continue on these medicines and return to see me in 4 months.

## 2019-03-25 ENCOUNTER — Encounter (HOSPITAL_COMMUNITY): Payer: Self-pay | Admitting: Psychiatry

## 2019-07-02 ENCOUNTER — Ambulatory Visit (INDEPENDENT_AMBULATORY_CARE_PROVIDER_SITE_OTHER): Payer: Medicaid Other | Admitting: Psychiatry

## 2019-07-02 ENCOUNTER — Encounter (HOSPITAL_COMMUNITY): Payer: Self-pay | Admitting: Psychiatry

## 2019-07-02 ENCOUNTER — Other Ambulatory Visit: Payer: Self-pay

## 2019-07-02 DIAGNOSIS — G4700 Insomnia, unspecified: Secondary | ICD-10-CM | POA: Diagnosis not present

## 2019-07-02 DIAGNOSIS — F324 Major depressive disorder, single episode, in partial remission: Secondary | ICD-10-CM

## 2019-07-02 DIAGNOSIS — F4323 Adjustment disorder with mixed anxiety and depressed mood: Secondary | ICD-10-CM

## 2019-07-02 MED ORDER — CLONAZEPAM 0.5 MG PO TABS: ORAL_TABLET | ORAL | 4 refills | Status: DC

## 2019-07-02 MED ORDER — DULOXETINE HCL 60 MG PO CPEP: ORAL_CAPSULE | ORAL | 5 refills | Status: DC

## 2019-07-02 NOTE — Progress Notes (Signed)
[facility] MD Progress Note  07/02/2019 2:39 PM Kevin Hall  MRN:  [id number] Subjective:  No real change doing okay Principal Problem: Adjustment disorder with anxious mood state   Today the patient is doing well. His 50-year-old son [name] is growing up. He just started back to school. The patient's elderly mother is doing well. The patient has no signs of all infection. He is chronic mild pain in his leg which worsens if he exercises her uses it too much. Unfortunately patient is gained a significant amount of weight. He saw his primary care Dr. This week. The good news is that patient has not gone back to smoking cigarettes. He drinks no alcohol and uses no drugs. Is positive and optimistic. He spends a lot of time dedicated and sacrificing to the care of his son. The patient likes music a lot. He takes his medicines just as prescribed.  Past Psychiatric History:   Past Medical History:  Past Medical History:  Diagnosis Date  . Achilles tendinitis   . Anxiety   . Back pain   . Depression   . Hypercholesteremia   . Shoulder dislocation     Past Surgical History:  Procedure Laterality Date  . HERNIA REPAIR    . KNEE ARTHROSCOPY    . KNEE ARTHROSCOPY Left 07/10/2017  . TONSILLECTOMY AND ADENOIDECTOMY     Family History:  Family History  Problem Relation Age of Onset  . Anxiety disorder Mother   . Depression Mother   . Diabetes Mother   . Hypertension Mother   . Thyroid disease Mother   . Cancer - Lung Father   . Alcohol abuse Father   . Cancer Father   . Bipolar disorder Brother   . Anxiety disorder Sister    Family Psychiatric  History:  Social History:  Social History   Substance and Sexual Activity  Alcohol Use No   Comment: a few beers every month      Social History   Substance and Sexual Activity  Drug Use No  . Types: Marijuana, LSD   Comment: Drug use twenty years ago     Social History   Socioeconomic History  . Marital status: Divorced    Spouse  name: Not on file  . Number of children: Not on file  . Years of education: Not on file  . Highest education level: Not on file  Occupational History  . Not on file  Tobacco Use  . Smoking status: Former Smoker    Packs/day: 1.00    Years: 31.00    Pack years: 31.00    Types: Cigarettes    Quit date: 09/26/2017    Years since quitting: 1.7  . Smokeless tobacco: Never Used  Substance and Sexual Activity  . Alcohol use: No    Comment: a few beers every month   . Drug use: No    Types: Marijuana, LSD    Comment: Drug use twenty years ago   . Sexual activity: Not Currently  Other Topics Concern  . Not on file  Social History Narrative  . Not on file   Social Determinants of Health   Financial Resource Strain:   . Difficulty of Paying Living Expenses: Not on file  Food Insecurity:   . Worried About Programme researcher, broadcasting/film/video in the Last Year: Not on file  . Ran Out of Food in the Last Year: Not on file  Transportation Needs:   . Lack of Transportation (Medical): Not on file  .  Lack of Transportation (Non-Medical): Not on file  Physical Activity:   . Days of Exercise per Week: Not on file  . Minutes of Exercise per Session: Not on file  Stress:   . Feeling of Stress : Not on file  Social Connections:   . Frequency of Communication with Friends and Family: Not on file  . Frequency of Social Gatherings with Friends and Family: Not on file  . Attends Religious Services: Not on file  . Active Member of Clubs or Organizations: Not on file  . Attends Archivist Meetings: Not on file  . Marital Status: Not on file   Additional Social History:                         Sleep: Good  Appetite:  Good  Current Medications: Current Outpatient Medications  Medication Sig Dispense Refill  . atorvastatin (LIPITOR) 40 MG tablet Take 40 mg by mouth daily.  5  . clonazePAM (KLONOPIN) 0.5 MG tablet 1 bid 60 tablet 4  . DULoxetine (CYMBALTA) 60 MG capsule 1  qam 30 capsule  5  . gabapentin (NEURONTIN) 600 MG tablet Take 600 mg by mouth 3 (three) times daily.    . methocarbamol (ROBAXIN) 500 MG tablet Take 500 mg by mouth 3 (three) times daily.    . OxyCODONE HCl, Abuse Deter, (OXAYDO) 5 MG TABA Take 5 mg by mouth every 4 (four) hours.    . ranitidine (RANITIDINE 150 MAX STRENGTH) 150 MG tablet Take 150 mg by mouth 2 (two) times daily.     No current facility-administered medications for this visit.    Lab Results: No results found for this or any previous visit (from the past 48 hour(s)).  Blood Alcohol level:  No results found for: [facility]  Physical Findings: AIMS:  , ,  ,  ,    CIWA:    COWS:     Musculoskeletal: Strength & Muscle Tone: within normal limits Gait & Station: normal Patient leans: N/A  Psychiatric Specialty Exam: ROS  There were no vitals taken for this visit.There is no height or weight on file to calculate BMI.  General Appearance: Casual  Eye Contact::  Fair  Speech:  Clear and Coherent  Volume:  Normal  Mood:  Dysphoric  Affect:  Congruent  Thought Process:  Coherent  Orientation:  Full (Time, Place, and Person)  Thought Content:  WDL  Suicidal Thoughts:  No  Homicidal Thoughts:  No  Memory:  NA  Judgement:  Fair  Insight:  Fair  Psychomotor Activity:  Normal  Concentration:  Fair  Recall:  Good  Fund of Knowledge:Fair  Language: Good   Akathisia:  No  Handed:  Right  AIMS (if indicated):     Assets:  Communication Skills  ADL's:  Intact  Cognition: WNL  Sleep:      Treatment Plan Summary  At this time the patient is #1 problem his Maj. Depression. At this time he is in remission. Continue taking Cymbalta as prescribed. Second problem is that of insomnia. He takes Klonopin as prescribed and as well. He shows no signs of misuse or abuse. Is functioning very well.

## 2019-10-29 ENCOUNTER — Telehealth (INDEPENDENT_AMBULATORY_CARE_PROVIDER_SITE_OTHER): Payer: Medicaid Other | Admitting: Psychiatry

## 2019-10-29 ENCOUNTER — Other Ambulatory Visit: Payer: Self-pay

## 2019-10-29 DIAGNOSIS — F320 Major depressive disorder, single episode, mild: Secondary | ICD-10-CM | POA: Diagnosis not present

## 2019-10-29 DIAGNOSIS — F4323 Adjustment disorder with mixed anxiety and depressed mood: Secondary | ICD-10-CM | POA: Diagnosis not present

## 2019-10-29 MED ORDER — DULOXETINE HCL 60 MG PO CPEP: ORAL_CAPSULE | ORAL | 5 refills | Status: DC

## 2019-10-29 MED ORDER — CLONAZEPAM 0.5 MG PO TABS: ORAL_TABLET | ORAL | 5 refills | Status: DC
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
## 2020-03-10 ENCOUNTER — Other Ambulatory Visit: Payer: Self-pay

## 2020-03-10 ENCOUNTER — Ambulatory Visit (INDEPENDENT_AMBULATORY_CARE_PROVIDER_SITE_OTHER): Payer: Medicaid Other | Admitting: Psychiatry

## 2020-03-10 DIAGNOSIS — F4323 Adjustment disorder with mixed anxiety and depressed mood: Secondary | ICD-10-CM

## 2020-03-10 MED ORDER — CLONAZEPAM 0.5 MG PO TABS: ORAL_TABLET | ORAL | 5 refills | Status: DC

## 2020-03-10 MED ORDER — DULOXETINE HCL 60 MG PO CPEP: ORAL_CAPSULE | ORAL | 5 refills | Status: DC

## 2020-03-10 NOTE — Progress Notes (Signed)
[facility] MD Progress Note  03/10/2020 2:08 PM Kevin Hall  MRN:  [id number] Subjective:  No real change doing okay Principal Problem: Adjustment disorder with anxious mood state   Today the patient is doing well.  This is doing well.  Patient is still recovering from an ankle injury.  This seems to be stable.  Does not drink any alcohol or using drugs.  The patient is sleeping and eating well.  He is very stable.  He takes medicines just as prescribed.  He sees Dr Arma Heading.  The patient's anxiety is well controlled.  He takes his medicine as prescribed.  He is handling the pandemic fairly well.  His health is good.  Financially he is stable.  Presently he is in no romantic relationships.  He is at his baseline at this time.  Past Medical History:  Past Medical History:  Diagnosis Date  . Achilles tendinitis   . Anxiety   . Back pain   . Depression   . Hypercholesteremia   . Shoulder dislocation     Past Surgical History:  Procedure Laterality Date  . HERNIA REPAIR    . KNEE ARTHROSCOPY    . KNEE ARTHROSCOPY Left 07/10/2017  . TONSILLECTOMY AND ADENOIDECTOMY     Family History:  Family History  Problem Relation Age of Onset  . Anxiety disorder Mother   . Depression Mother   . Diabetes Mother   . Hypertension Mother   . Thyroid disease Mother   . Cancer - Lung Father   . Alcohol abuse Father   . Cancer Father   . Bipolar disorder Brother   . Anxiety disorder Sister    Family Psychiatric  History:  Social History:  Social History   Substance and Sexual Activity  Alcohol Use No   Comment: a few beers every month      Social History   Substance and Sexual Activity  Drug Use No  . Types: Marijuana, LSD   Comment: Drug use twenty years ago     Social History   Socioeconomic History  . Marital status: Divorced    Spouse name: Not on file  . Number of children: Not on file  . Years of education: Not on file  . Highest education level: Not on file  Occupational History   . Not on file  Tobacco Use  . Smoking status: Former Smoker    Packs/day: 1.00    Years: 31.00    Pack years: 31.00    Types: Cigarettes    Quit date: 09/26/2017    Years since quitting: 2.4  . Smokeless tobacco: Never Used  Vaping Use  . Vaping Use: Never used  Substance and Sexual Activity  . Alcohol use: No    Comment: a few beers every month   . Drug use: No    Types: Marijuana, LSD    Comment: Drug use twenty years ago   . Sexual activity: Not Currently  Other Topics Concern  . Not on file  Social History Narrative  . Not on file   Social Determinants of Health   Financial Resource Strain:   . Difficulty of Paying Living Expenses: Not on file  Food Insecurity:   . Worried About Programme researcher, broadcasting/film/video in the Last Year: Not on file  . Ran Out of Food in the Last Year: Not on file  Transportation Needs:   . Lack of Transportation (Medical): Not on file  . Lack of Transportation (Non-Medical): Not on file  Physical  Activity:   . Days of Exercise per Week: Not on file  . Minutes of Exercise per Session: Not on file  Stress:   . Feeling of Stress : Not on file  Social Connections:   . Frequency of Communication with Friends and Family: Not on file  . Frequency of Social Gatherings with Friends and Family: Not on file  . Attends Religious Services: Not on file  . Active Member of Clubs or Organizations: Not on file  . Attends Banker Meetings: Not on file  . Marital Status: Not on file   Additional Social History:                         Sleep: Good  Appetite:  Good  Current Medications: Current Outpatient Medications  Medication Sig Dispense Refill  . atorvastatin (LIPITOR) 40 MG tablet Take 40 mg by mouth daily.  5  . clonazePAM (KLONOPIN) 0.5 MG tablet 1 bid 60 tablet 5  . DULoxetine (CYMBALTA) 60 MG capsule 1  qam 30 capsule 5  . gabapentin (NEURONTIN) 600 MG tablet Take 600 mg by mouth 3 (three) times daily.    . methocarbamol  (ROBAXIN) 500 MG tablet Take 500 mg by mouth 3 (three) times daily.    . OxyCODONE HCl, Abuse Deter, (OXAYDO) 5 MG TABA Take 5 mg by mouth every 4 (four) hours.    . ranitidine (RANITIDINE 150 MAX STRENGTH) 150 MG tablet Take 150 mg by mouth 2 (two) times daily.     No current facility-administered medications for this visit.    Lab Results: No results found for this or any previous visit (from the past 48 hour(s)).  Blood Alcohol level:  No results found for: [facility]  Physical Findings: AIMS:  , ,  ,  ,    CIWA:    COWS:     Musculoskeletal: Strength & Muscle Tone: within normal limits Gait & Station: normal Patient leans: N/A  Psychiatric Specialty Exam: ROS  There were no vitals taken for this visit.There is no height or weight on file to calculate BMI.  General Appearance: Casual  Eye Contact::  Fair  Speech:  Clear and Coherent  Volume:  Normal  Mood:  Dysphoric  Affect:  Congruent  Thought Process:  Coherent  Orientation:  Full (Time, Place, and Person)  Thought Content:  WDL  Suicidal Thoughts:  No  Homicidal Thoughts:  No  Memory:  NA  Judgement:  Fair  Insight:  Fair  Psychomotor Activity:  Normal  Concentration:  Fair  Recall:  Good  Fund of Knowledge:Fair  Language: Good   Akathisia:  No  Handed:  Right  AIMS (if indicated):     Assets:  Communication Skills  ADL's:  Intact  Cognition: WNL  Sleep:      Treatment Plan Summary  This patient is #1 problem is that of major depression.  At this time is in remission.  He will continue taking his Cymbalta as prescribed.  He also has an adjustment disorder with an anxious mood and takes a fixed dose of Klonopin.  He takes both of these medicines consistently and responsibly.  The patient be reevaluated in 4 months.  He is very stable.

## 2020-07-14 ENCOUNTER — Telehealth (INDEPENDENT_AMBULATORY_CARE_PROVIDER_SITE_OTHER): Payer: Medicaid Other | Admitting: Psychiatry

## 2020-07-14 ENCOUNTER — Other Ambulatory Visit: Payer: Self-pay

## 2020-07-14 DIAGNOSIS — F325 Major depressive disorder, single episode, in full remission: Secondary | ICD-10-CM
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
## 2020-09-20 ENCOUNTER — Other Ambulatory Visit (HOSPITAL_COMMUNITY): Payer: Self-pay | Admitting: Psychiatry

## 2020-09-20 DIAGNOSIS — F4323 Adjustment disorder with mixed anxiety and depressed mood: Secondary | ICD-10-CM

## 2020-10-13 ENCOUNTER — Other Ambulatory Visit: Payer: Self-pay

## 2020-10-13 ENCOUNTER — Telehealth (INDEPENDENT_AMBULATORY_CARE_PROVIDER_SITE_OTHER): Payer: Medicaid Other | Admitting: Psychiatry

## 2020-10-13 DIAGNOSIS — F325 Major depressive disorder, single episode, in full remission: Secondary | ICD-10-CM

## 2020-10-13 DIAGNOSIS — F4323 Adjustment disorder with mixed anxiety and depressed mood: Secondary | ICD-10-CM

## 2020-10-13 MED ORDER — CLONAZEPAM 0.5 MG PO TABS
ORAL_TABLET | ORAL | 5 refills | Status: DC
Start: 2020-10-13 — End: 2021-01-18

## 2020-10-13 MED ORDER — DULOXETINE HCL 30 MG PO CPEP: ORAL_CAPSULE | ORAL | 1 refills | Status: DC

## 2020-10-13 NOTE — Progress Notes (Signed)
[facility] MD Progress Note  10/13/2020 2:14 PM Shanan Mcmiller  MRN:  [id number] Subjective:  No real change doing okay Principal Problem: Adjustment disorder with anxious mood state   Today the patient is doing fairly well.  His son is doing great.  The patient's biggest issue is she is having some right shoulder pain but is getting injections for it.  The patient is back to playing guitar.  He is sleeping very well and eating much better.  His energy level is good.  Today he asked if he could probably come off the Cymbalta as he remembers it being daily for pain.  He says with the help lists the injections his pain is much much better about that.  The patient is trying to get out more.  She be more social.  I think overall he is thriving back.  I think it is reasonable to slowly come off the Cymbalta but to continue the Klonopin that he has been on for over a decade. Past Medical History:  Past Medical History:  Diagnosis Date  . Achilles tendinitis   . Anxiety   . Back pain   . Depression   . Hypercholesteremia   . Shoulder dislocation     Past Surgical History:  Procedure Laterality Date  . HERNIA REPAIR    . KNEE ARTHROSCOPY    . KNEE ARTHROSCOPY Left 07/10/2017  . TONSILLECTOMY AND ADENOIDECTOMY     Family History:  Family History  Problem Relation Age of Onset  . Anxiety disorder Mother   . Depression Mother   . Diabetes Mother   . Hypertension Mother   . Thyroid disease Mother   . Cancer - Lung Father   . Alcohol abuse Father   . Cancer Father   . Bipolar disorder Brother   . Anxiety disorder Sister    Family Psychiatric  History:  Social History:  Social History   Substance and Sexual Activity  Alcohol Use No   Comment: a few beers every month      Social History   Substance and Sexual Activity  Drug Use No  . Types: Marijuana, LSD   Comment: Drug use twenty years ago     Social History   Socioeconomic History  . Marital status: Divorced    Spouse name: Not  on file  . Number of children: Not on file  . Years of education: Not on file  . Highest education level: Not on file  Occupational History  . Not on file  Tobacco Use  . Smoking status: Former Smoker    Packs/day: 1.00    Years: 31.00    Pack years: 31.00    Types: Cigarettes    Quit date: 09/26/2017    Years since quitting: 3.0  . Smokeless tobacco: Never Used  Vaping Use  . Vaping Use: Never used  Substance and Sexual Activity  . Alcohol use: No    Comment: a few beers every month   . Drug use: No    Types: Marijuana, LSD    Comment: Drug use twenty years ago   . Sexual activity: Not Currently  Other Topics Concern  . Not on file  Social History Narrative  . Not on file   Social Determinants of Health   Financial Resource Strain: Not on file  Food Insecurity: Not on file  Transportation Needs: Not on file  Physical Activity: Not on file  Stress: Not on file  Social Connections: Not on file   Additional Social  History:                         Sleep: Good  Appetite:  Good  Current Medications: Current Outpatient Medications  Medication Sig Dispense Refill  . atorvastatin (LIPITOR) 40 MG tablet Take 40 mg by mouth daily.  5  . clonazePAM (KLONOPIN) 0.5 MG tablet 1 bid 60 tablet 5  . DULoxetine (CYMBALTA) 30 MG capsule 1  qam 30 capsule 1  . gabapentin (NEURONTIN) 600 MG tablet Take 600 mg by mouth 3 (three) times daily.    . methocarbamol (ROBAXIN) 500 MG tablet Take 500 mg by mouth 3 (three) times daily.    . OxyCODONE HCl, Abuse Deter, (OXAYDO) 5 MG TABA Take 5 mg by mouth every 4 (four) hours.    . ranitidine (RANITIDINE 150 MAX STRENGTH) 150 MG tablet Take 150 mg by mouth 2 (two) times daily.     No current facility-administered medications for this visit.    Lab Results: No results found for this or any previous visit (from the past 48 hour(s)).  Blood Alcohol level:  No results found for: [facility]  Physical Findings: AIMS:  , ,  ,  ,    CIWA:     COWS:     Musculoskeletal: Strength & Muscle Tone: within normal limits Gait & Station: normal Patient leans: N/A  Psychiatric Specialty Exam: ROS  There were no vitals taken for this visit.There is no height or weight on file to calculate BMI.  General Appearance: Casual  Eye Contact::  Fair  Speech:  Clear and Coherent  Volume:  Normal  Mood:  Dysphoric  Affect:  Congruent  Thought Process:  Coherent  Orientation:  Full (Time, Place, and Person)  Thought Content:  WDL  Suicidal Thoughts:  No  Homicidal Thoughts:  No  Memory:  NA  Judgement:  Fair  Insight:  Fair  Psychomotor Activity:  Normal  Concentration:  Fair  Recall:  Good  Fund of Knowledge:Fair  Language: Good   Akathisia:  No  Handed:  Right  AIMS (if indicated):     Assets:  Communication Skills  ADL's:  Intact  Cognition: WNL  Sleep:      Treatment Plan Summary  Today the patient is doing actually well.  His son is doing great.  His son is 67 years old get straight A's.  The patient is active.  He is not having any particular problems other than some right shoulder pain which is being treated medically.  He denies daily persistent depression.  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  Today we will reduce his Cymbalta to 30 mg for a month and then discontinue.  The patient will be seen again in 3 months.  For now he will continue taking the Klonopin 0.5 mg 2 at night.  I am hesitant to reduce this or change this at all.  Patient is doing so well.  He drinks no alcohol and uses no drugs and is functioning well.

## 2020-11-04 ENCOUNTER — Other Ambulatory Visit (HOSPITAL_COMMUNITY): Payer: Self-pay | Admitting: Psychiatry

## 2021-01-18 ENCOUNTER — Telehealth (INDEPENDENT_AMBULATORY_CARE_PROVIDER_SITE_OTHER): Payer: Medicaid Other | Admitting: Psychiatry

## 2021-01-18 ENCOUNTER — Other Ambulatory Visit: Payer: Self-pay

## 2021-01-18 DIAGNOSIS — F411 Generalized anxiety disorder: Secondary | ICD-10-CM | POA: Diagnosis not present

## 2021-01-18 DIAGNOSIS — F4323 Adjustment disorder with mixed anxiety and depressed mood: Secondary | ICD-10-CM | POA: Diagnosis not present

## 2021-01-18 MED ORDER — CLONAZEPAM 0.5 MG PO TABS: ORAL_TABLET | ORAL | 5 refills | Status: DC

## 2021-01-18 NOTE — Progress Notes (Signed)
[facility] MD Progress Note  01/18/2021 2:28 PM Kevin Hall  MRN:  [id number] Subjective:  No real change doing okay Principal Problem: Adjustment disorder with anxious mood state    Today the patient is at his baseline.  He has some chronic pain in his shoulder and is getting injections.  He says it is moderately helpful.  It is noted the patient has been off of opiates since 2020.  He was on gabapentin but says he gained a lot of weight from it and discontinue that.  He is now lost about 45 pounds in the last year.  He feels much better.  When her last visit we made a decision to discontinue his Cymbalta as well.  He notes no real difference off the Cymbalta.  His son who is age 69 is doing well in the month to be back in school.  The patient continues to play guitar.  He does gardening.  He likes spider plants.  The patient's mother is doing fairly well.  She is in bed most of the day mostly at night.  The patient shares with me that his sister died in the last year as well as a close friend.  She has been dealing with a few deaths.  He is very worried about the pandemic.  Today he was interviewed by the phone.  Overall he denies daily depression.  He is sleeping and eating fairly well and has good energy.  He has no problems thinking or concentrating.  He denies any use of alcohol or drugs.  The patient enjoys playing the guitar and most of the time and enjoys being with his son.  The patient has no romance at this time. Past Medical History:  Past Medical History:  Diagnosis Date   Achilles tendinitis    Anxiety    Back pain    Depression    Hypercholesteremia    Shoulder dislocation     Past Surgical History:  Procedure Laterality Date   HERNIA REPAIR     KNEE ARTHROSCOPY     KNEE ARTHROSCOPY Left 07/10/2017   TONSILLECTOMY AND ADENOIDECTOMY     Family History:  Family History  Problem Relation Age of Onset   Anxiety disorder Mother    Depression Mother    Diabetes Mother     Hypertension Mother    Thyroid disease Mother    Cancer - Lung Father    Alcohol abuse Father    Cancer Father    Bipolar disorder Brother    Anxiety disorder Sister    Family Psychiatric  History:  Social History:  Social History   Substance and Sexual Activity  Alcohol Use No   Comment: a few beers every month      Social History   Substance and Sexual Activity  Drug Use No   Types: Marijuana, LSD   Comment: Drug use twenty years ago     Social History   Socioeconomic History   Marital status: Divorced    Spouse name: Not on file   Number of children: Not on file   Years of education: Not on file   Highest education level: Not on file  Occupational History   Not on file  Tobacco Use   Smoking status: Former    Packs/day: 1.00    Years: 31.00    Pack years: 31.00    Types: Cigarettes    Quit date: 09/26/2017    Years since quitting: 3.3   Smokeless tobacco: Never  Vaping Use  Vaping Use: Never used  Substance and Sexual Activity   Alcohol use: No    Comment: a few beers every month    Drug use: No    Types: Marijuana, LSD    Comment: Drug use twenty years ago    Sexual activity: Not Currently  Other Topics Concern   Not on file  Social History Narrative   Not on file   Social Determinants of Health   Financial Resource Strain: Not on file  Food Insecurity: Not on file  Transportation Needs: Not on file  Physical Activity: Not on file  Stress: Not on file  Social Connections: Not on file   Additional Social History:                         Sleep: Good  Appetite:  Good  Current Medications: Current Outpatient Medications  Medication Sig Dispense Refill   atorvastatin (LIPITOR) 40 MG tablet Take 40 mg by mouth daily.  5   clonazePAM (KLONOPIN) 0.5 MG tablet 1 bid 60 tablet 5   gabapentin (NEURONTIN) 600 MG tablet Take 600 mg by mouth 3 (three) times daily.     methocarbamol (ROBAXIN) 500 MG tablet Take 500 mg by mouth 3 (three)  times daily.     OxyCODONE HCl, Abuse Deter, (OXAYDO) 5 MG TABA Take 5 mg by mouth every 4 (four) hours.     ranitidine (RANITIDINE 150 MAX STRENGTH) 150 MG tablet Take 150 mg by mouth 2 (two) times daily.     No current facility-administered medications for this visit.    Lab Results: No results found for this or any previous visit (from the past 48 hour(s)).  Blood Alcohol level:  No results found for: [facility]  Physical Findings: AIMS:  , ,  ,  ,    CIWA:    COWS:     Musculoskeletal: Strength & Muscle Tone: within normal limits Gait & Station: normal Patient leans: N/A  Psychiatric Specialty Exam: ROS  There were no vitals taken for this visit.There is no height or weight on file to calculate BMI.  General Appearance: Casual  Eye Contact::  Fair  Speech:  Clear and Coherent  Volume:  Normal  Mood:  Dysphoric  Affect:  Congruent  Thought Process:  Coherent  Orientation:  Full (Time, Place, and Person)  Thought Content:  WDL  Suicidal Thoughts:  No  Homicidal Thoughts:  No  Memory:  NA  Judgement:  Fair  Insight:  Fair  Psychomotor Activity:  Normal  Concentration:  Fair  Recall:  Good  Fund of Knowledge:Fair  Language: Good   Akathisia:  No  Handed:  Right  AIMS (if indicated):     Assets:  Communication Skills  ADL's:  Intact  Cognition: WNL  Sleep:      Treatment Plan Summary  This patient's first problem is that of an adjustment disorder with an anxious mood state.  He has done very well coming off Cymbalta and now he still continues to take Klonopin 0.5 mg 2 at night.  I think this is a very safe medicine for this individual who drinks no alcohol and uses no drugs.  He has been on multiple psychotropic drugs over the years and at this time he is on his lowest medication regime ever.  At this time we will continue these medicines and see him again in 4 months we will talk about how long he will continue the Klonopin or if there  are any ideas of the substitution.   I believe there is no evidence of abuse or misuse of his Klonopin.  Emotionally he seems to be quite stable.

## 2021-05-24 ENCOUNTER — Telehealth (HOSPITAL_BASED_OUTPATIENT_CLINIC_OR_DEPARTMENT_OTHER): Payer: Medicaid Other | Admitting: Psychiatry

## 2021-05-24 ENCOUNTER — Other Ambulatory Visit: Payer: Self-pay

## 2021-05-24 DIAGNOSIS — F411 Generalized anxiety disorder: Secondary | ICD-10-CM | POA: Diagnosis not present

## 2021-05-24 DIAGNOSIS — F4323 Adjustment disorder with mixed anxiety and depressed mood: Secondary | ICD-10-CM | POA: Diagnosis not present

## 2021-05-24 MED ORDER — CLONAZEPAM 0.5 MG PO TABS: ORAL_TABLET | ORAL | 5 refills | Status: DC

## 2021-05-24 NOTE — Progress Notes (Signed)
[facility] MD Progress Note  05/24/2021 2:32 PM Kevin Hall  MRN:  [id number] Subjective:  No real change doing okay Principal Problem: Adjustment disorder with anxious mood state    Today the patient is actually doing pretty well.  He has left shoulder pain.  He is having 4 injections may work well.  Note is the patient is off Cymbalta.  The patient takes Klonopin 0.5 mg 2 at night.  He actually takes 1 at dinner and 1 just before he goes to bed.  His son Kevin Hall is doing great in school.  He is 51 years old and he gets straight A's.  The patient's mother is doing well.  She is somewhat tense and bitter at times but for the most part her health is fairly good.  The patient enjoys being on the computer.  He reads.  He drinks no alcohol and uses no drugs.  He still plays the guitar somewhat.  He does have a lot of money issues.  But other than that he seems to be doing fairly well.  He is happy with life the way it is.  The patient has never been psychotic.  He lives independently with his son and his mother. Past Medical History:  Past Medical History:  Diagnosis Date   Achilles tendinitis    Anxiety    Back pain    Depression    Hypercholesteremia    Shoulder dislocation     Past Surgical History:  Procedure Laterality Date   HERNIA REPAIR     KNEE ARTHROSCOPY     KNEE ARTHROSCOPY Left 07/10/2017   TONSILLECTOMY AND ADENOIDECTOMY     Family History:  Family History  Problem Relation Age of Onset   Anxiety disorder Mother    Depression Mother    Diabetes Mother    Hypertension Mother    Thyroid disease Mother    Cancer - Lung Father    Alcohol abuse Father    Cancer Father    Bipolar disorder Brother    Anxiety disorder Sister    Family Psychiatric  History:  Social History:  Social History   Substance and Sexual Activity  Alcohol Use No   Comment: a few beers every month      Social History   Substance and Sexual Activity  Drug Use No   Types: Marijuana, LSD    Comment: Drug use twenty years ago     Social History   Socioeconomic History   Marital status: Divorced    Spouse name: Not on file   Number of children: Not on file   Years of education: Not on file   Highest education level: Not on file  Occupational History   Not on file  Tobacco Use   Smoking status: Former    Packs/day: 1.00    Years: 31.00    Pack years: 31.00    Types: Cigarettes    Quit date: 09/26/2017    Years since quitting: 3.6   Smokeless tobacco: Never  Vaping Use   Vaping Use: Never used  Substance and Sexual Activity   Alcohol use: No    Comment: a few beers every month    Drug use: No    Types: Marijuana, LSD    Comment: Drug use twenty years ago    Sexual activity: Not Currently  Other Topics Concern   Not on file  Social History Narrative   Not on file   Social Determinants of Health   Financial Resource Strain: Not  on file  Food Insecurity: Not on file  Transportation Needs: Not on file  Physical Activity: Not on file  Stress: Not on file  Social Connections: Not on file   Additional Social History:                         Sleep: Good  Appetite:  Good  Current Medications: Current Outpatient Medications  Medication Sig Dispense Refill   atorvastatin (LIPITOR) 40 MG tablet Take 40 mg by mouth daily.  5   clonazePAM (KLONOPIN) 0.5 MG tablet 1 bid 60 tablet 5   gabapentin (NEURONTIN) 600 MG tablet Take 600 mg by mouth 3 (three) times daily.     methocarbamol (ROBAXIN) 500 MG tablet Take 500 mg by mouth 3 (three) times daily.     OxyCODONE HCl, Abuse Deter, (OXAYDO) 5 MG TABA Take 5 mg by mouth every 4 (four) hours.     ranitidine (RANITIDINE 150 MAX STRENGTH) 150 MG tablet Take 150 mg by mouth 2 (two) times daily.     No current facility-administered medications for this visit.    Lab Results: No results found for this or any previous visit (from the past 48 hour(s)).  Blood Alcohol level:  No results found for:  [facility]  Physical Findings: AIMS:  , ,  ,  ,    CIWA:    COWS:     Musculoskeletal: Strength & Muscle Tone: within normal limits Gait & Station: normal Patient leans: N/A  Psychiatric Specialty Exam: ROS  There were no vitals taken for this visit.There is no height or weight on file to calculate BMI.  General Appearance: Casual  Eye Contact::  Fair  Speech:  Clear and Coherent  Volume:  Normal  Mood:  Dysphoric  Affect:  Congruent  Thought Process:  Coherent  Orientation:  Full (Time, Place, and Person)  Thought Content:  WDL  Suicidal Thoughts:  No  Homicidal Thoughts:  No  Memory:  NA  Judgement:  Fair  Insight:  Fair  Psychomotor Activity:  Normal  Concentration:  Fair  Recall:  Good  Fund of Knowledge:Fair  Language: Good   Akathisia:  No  Handed:  Right  AIMS (if indicated):     Assets:  Communication Skills  ADL's:  Intact  Cognition: WNL  Sleep:      Treatment Plan Summary  This patient's first problem is that of an adjustment disorder with an anxious mood state.  He takes Klonopin appropriately.  He takes 0.5 mg at dinnertime and the other 0.5 just before he goes to bed.  He is functioning actually very well.  He seems to be a very dedicated father.  This patient will be reevaluated in 5 months.  At this time he is very stable.

## 2021-10-25 ENCOUNTER — Telehealth (HOSPITAL_COMMUNITY): Payer: Medicaid Other | Admitting: Psychiatry

## 2021-10-26 ENCOUNTER — Ambulatory Visit (HOSPITAL_BASED_OUTPATIENT_CLINIC_OR_DEPARTMENT_OTHER): Payer: Medicaid Other | Admitting: Psychiatry

## 2021-10-26 DIAGNOSIS — F4323 Adjustment disorder with mixed anxiety and depressed mood: Secondary | ICD-10-CM

## 2021-10-26 DIAGNOSIS — F411 Generalized anxiety disorder: Secondary | ICD-10-CM

## 2021-10-26 MED ORDER — CLONAZEPAM 0.5 MG PO TABS: ORAL_TABLET | ORAL | 5 refills | Status: DC

## 2021-10-26 NOTE — Progress Notes (Signed)
[facility] MD Progress Note ? ?10/26/2021 1:50 PM ?Kevin Hall  ?MRN:  [id number] ?Subjective:  No real change doing okay ?Principal Problem: Adjustment disorder with anxious mood state ?  ? ? ?Today the patient seems to be at his baseline.  He has no new complaints.  His left leg I think is much better.  He has less pain complaints.  His son [name] age 52 is doing well.  Patient also lives with his mother who is in her 3s and is in quite as well.  The patient overall looks older than his stated age.  He claims his health is actually pretty good.  Financially he is stable.  He has no significant romantic relationships.  He denies any use of drugs or alcohol.  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  The patient continues to drive without a problem.  Patient likes where he lives.  He still likes music a lot and tries to play the guitar.  The patient takes his medicines just as prescribed.  I believe his anxiety is well controlled at this time.  He denies daily depression. ?Past Medical History:  ?Past Medical History:  ?Diagnosis Date  ? Achilles tendinitis   ? Anxiety   ? Back pain   ? Depression   ? Hypercholesteremia   ? Shoulder dislocation   ?  ?Past Surgical History:  ?Procedure Laterality Date  ? HERNIA REPAIR    ? KNEE ARTHROSCOPY    ? KNEE ARTHROSCOPY Left 07/10/2017  ? TONSILLECTOMY AND ADENOIDECTOMY    ? ?Family History:  ?Family History  ?Problem Relation Age of Onset  ? Anxiety disorder Mother   ? Depression Mother   ? Diabetes Mother   ? Hypertension Mother   ? Thyroid disease Mother   ? Cancer - Lung Father   ? Alcohol abuse Father   ? Cancer Father   ? Bipolar disorder Brother   ? Anxiety disorder Sister   ? ?Family Psychiatric  History:  ?Social History:  ?Social History  ? ?Substance and Sexual Activity  ?Alcohol Use No  ? Comment: a few beers every month   ?   ?Social History  ? ?Substance and Sexual Activity  ?Drug Use No  ? Types: Marijuana, LSD  ? Comment: Drug use twenty years ago   ?   ?Social History  ? ?Socioeconomic History  ? Marital status: Divorced  ?  Spouse name: Not on file  ? Number of children: Not on file  ? Years of education: Not on file  ? Highest education level: Not on file  ?Occupational History  ? Not on file  ?Tobacco Use  ? Smoking status: Former  ?  Packs/day: 1.00  ?  Years: 31.00  ?  Pack years: 31.00  ?  Types: Cigarettes  ?  Quit date: 09/26/2017  ?  Years since quitting: 4.0  ? Smokeless tobacco: Never  ?Vaping Use  ? Vaping Use: Never used  ?Substance and Sexual Activity  ? Alcohol use: No  ?  Comment: a few beers every month   ? Drug use: No  ?  Types: Marijuana, LSD  ?  Comment: Drug use twenty years ago   ? Sexual activity: Not Currently  ?Other Topics Concern  ? Not on file  ?Social History Narrative  ? Not on file  ? ?Social Determinants of Health  ? ?Financial Resource Strain: Not on file  ?Food Insecurity: Not on file  ?Transportation Needs: Not on file  ?  Physical Activity: Not on file  ?Stress: Not on file  ?Social Connections: Not on file  ? ?Additional Social History:  ?  ?  ?  ?  ?  ?  ?  ?  ?  ?  ?  ? ?Sleep: Good ? ?Appetite:  Good ? ?Current Medications: ?Current Outpatient Medications  ?Medication Sig Dispense Refill  ? atorvastatin (LIPITOR) 40 MG tablet Take 40 mg by mouth daily.  5  ? clonazePAM (KLONOPIN) 0.5 MG tablet 1 bid 60 tablet 5  ? gabapentin (NEURONTIN) 600 MG tablet Take 600 mg by mouth 3 (three) times daily.    ? methocarbamol (ROBAXIN) 500 MG tablet Take 500 mg by mouth 3 (three) times daily.    ? OxyCODONE HCl, Abuse Deter, (OXAYDO) 5 MG TABA Take 5 mg by mouth every 4 (four) hours.    ? ranitidine (RANITIDINE 150 MAX STRENGTH) 150 MG tablet Take 150 mg by mouth 2 (two) times daily.    ? ?No current facility-administered medications for this visit.  ? ? ?Lab Results: No results found for this or any previous visit (from the past 48 hour(s)). ? ?Blood Alcohol level:  ?No results found for: [facility] ? ?Physical Findings: ?AIMS:  , ,  ,  ,     ?CIWA:    ?COWS:    ? ?Musculoskeletal: ?Strength & Muscle Tone: within normal limits ?Gait & Station: normal ?Patient leans: N/A ? ?Psychiatric Specialty Exam: ?ROS  ?There were no vitals taken for this visit.There is no height or weight on file to calculate BMI.  ?General Appearance: Casual  ?Eye Contact::  Fair  ?Speech:  Clear and Coherent  ?Volume:  Normal  ?Mood:  Dysphoric  ?Affect:  Congruent  ?Thought Process:  Coherent  ?Orientation:  Full (Time, Place, and Person)  ?Thought Content:  WDL  ?Suicidal Thoughts:  No  ?Homicidal Thoughts:  No  ?Memory:  NA  ?Judgement:  Fair  ?Insight:  Fair  ?Psychomotor Activity:  Normal  ?Concentration:  Fair  ?Recall:  Good  ?Fund of Knowledge:Fair  ?Language: Good   ?Akathisia:  No  ?Handed:  Right  ?AIMS (if indicated):     ?Assets:  Communication Skills  ?ADL's:  Intact  ?Cognition: WNL  ?Sleep:     ? ?Treatment Plan Summary ? ? ?This patient is doing well.  He has been diagnosed with an adjustment disorder with an anxious mood state.  Things have quieted down a lot but he does very well taking Klonopin 0.5 mg at dinnertime and 0.5 mg when he goes to bed at night.  He sleeps very well.  He uses no drugs or alcohol.  He takes his medicine appropriately.  He is not in therapy at this time.  Return again to see me in 5 months. ?

## 2021-12-31 ENCOUNTER — Other Ambulatory Visit: Payer: Self-pay

## 2021-12-31 DIAGNOSIS — M25462 Effusion, left knee: Secondary | ICD-10-CM | POA: Insufficient documentation

## 2021-12-31 DIAGNOSIS — M25562 Pain in left knee: Secondary | ICD-10-CM | POA: Diagnosis present

## 2021-12-31 DIAGNOSIS — W19XXXA Unspecified fall, initial encounter: Secondary | ICD-10-CM | POA: Diagnosis not present

## 2021-12-31 NOTE — ED Triage Notes (Signed)
Pt POV c/o left knee pain/swelling after mechanical fall today. Heard a crack when he fell. Pt ambulatory, able to bear weight.

## 2022-01-01 ENCOUNTER — Emergency Department (HOSPITAL_BASED_OUTPATIENT_CLINIC_OR_DEPARTMENT_OTHER)
Admission: EM | Admit: 2022-01-01 | Discharge: 2022-01-01 | Disposition: A | Discharge status: Home / Self Care | Payer: Medicaid Other | Attending: Emergency Medicine | Admitting: Emergency Medicine

## 2022-01-01 ENCOUNTER — Emergency Department (HOSPITAL_BASED_OUTPATIENT_CLINIC_OR_DEPARTMENT_OTHER): Payer: Medicaid Other

## 2022-01-01 ENCOUNTER — Emergency Department (HOSPITAL_BASED_OUTPATIENT_CLINIC_OR_DEPARTMENT_OTHER): Payer: Medicaid Other | Admitting: Radiology

## 2022-01-01 DIAGNOSIS — M25562 Pain in left knee: Secondary | ICD-10-CM

## 2022-01-01 MED ORDER — PREDNISONE 10 MG PO TABS
20.0000 mg | ORAL_TABLET | Freq: Every day | ORAL | 0 refills | Status: AC
Start: 2022-01-01 — End: 2022-01-06

## 2022-01-01 NOTE — ED Provider Notes (Signed)
Emergency Department Provider Note   I have reviewed the triage vital signs and the nursing notes.   HISTORY  Chief Complaint Knee Pain   HPI Kevin Hall is a 51 y.o. male with past history of knee pain and ossified bodies in the left knee presents with pain and swelling to the left knee.  Patient describes mechanical fall today with pain to the left knee but has been ambulatory.  He reports hearing a crack as he fell.  No pain in the ankle or hip.  Notes vomiting issue with both knees and has follow-up ongoing with orthopedics.  Denies any concern for dislocation/relocation.  No numbness or weakness in the knee/leg. No laceration.   Past Medical History:  Diagnosis Date   Achilles tendinitis    Anxiety    Back pain    Depression    Hypercholesteremia    Shoulder dislocation     Review of Systems  Constitutional: No fever/chills Cardiovascular: Denies chest pain. Respiratory: Denies shortness of breath. Gastrointestinal: No abdominal pain.   Musculoskeletal: Negative for back pain. Positive left knee pain/swelling.  Skin: Negative for rash. Neurological: Negative for headaches, focal weakness or numbness.   ____________________________________________   PHYSICAL EXAM:  VITAL SIGNS: ED Triage Vitals  Enc Vitals Group     BP 12/31/21 2204 (!) 170/99     Pulse Rate 12/31/21 2204 95     Resp 12/31/21 2204 18     Temp 12/31/21 2204 98.7 F (37.1 C)     Temp src --      SpO2 12/31/21 2204 96 %     Weight 12/31/21 2203 175 lb (79.4 kg)     Height 12/31/21 2203 5\' 9"  (1.753 m)   Constitutional: Alert and oriented. Well appearing and in no acute distress. Eyes: Conjunctivae are normal.  Head: Atraumatic. Nose: No congestion/rhinnorhea. Mouth/Throat: Mucous membranes are moist.   Neck: No stridor.   Cardiovascular: Good peripheral circulation.  Respiratory: Normal respiratory effort.   Gastrointestinal: No distention.  Musculoskeletal: Moderate left knee  effusion without warmth or erythema.  Normal range of motion with some crepitus.  No laceration.  No tenderness to the left ankle or hip.  Patient is ambulatory.  No significant joint laxity.  Neurologic:  Normal speech and language. No gross focal neurologic deficits are appreciated.  Skin:  Skin is warm, dry and intact. No rash noted.  ____________________________________________  RADIOLOGY  DG Knee Complete 4 Views Left  Result Date: 01/01/2022 CLINICAL DATA:  Left knee pain and swelling after mechanical fall today EXAM: LEFT KNEE - COMPLETE 4+ VIEW COMPARISON:  06/26/2016 FINDINGS: No acute fracture or dislocation. Knee joint effusion. Two ossified bodies projecting in the mid joint line, the larger measuring 12 mm in span. IMPRESSION: 1. Knee joint effusion without acute osseous finding. 2. 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 Electronically Signed   By: 08/24/2016 M.D.   On: 01/01/2022 04:16    ____________________________________________   PROCEDURES  Procedure(s) performed:   Procedures  none ____________________________________________   INITIAL IMPRESSION / ASSESSMENT AND PLAN / ED COURSE  Pertinent labs & imaging results that were available during my care of the patient were reviewed by me and considered in my medical decision making (see chart for details).   This patient is Presenting for Evaluation of knee pain, which does require a range of treatment options, and is a complaint that involves a high risk of morbidity and mortality.  The Differential Diagnoses include fracture, dislocation, septic joint,  tendon/ligament strain.  Radiologic Tests Ordered, included knee x-ray. I independently interpreted the images and agree with radiology interpretation.    Medical Decision Making: Summary:  Patient with injury to the left knee.  No bony abnormality on plain film.  Patient does have a moderate effusion but does not appear septic.  Advised RICE and continued  follow up with orthopedics as an outpatient. WBAT.  Disposition: discharge  ____________________________________________  FINAL CLINICAL IMPRESSION(S) / ED DIAGNOSES  Final diagnoses:  Acute pain of left knee     NEW OUTPATIENT MEDICATIONS STARTED DURING THIS VISIT:  New Prescriptions   PREDNISONE (DELTASONE) 10 MG TABLET    Take 2 tablets (20 mg total) by mouth daily for 5 days.    Note:  This document was prepared using Dragon voice recognition software and may include unintentional dictation errors.  Alona Bene, MD, [facility] Emergency Medicine    Anguel Delapena, Arlyss Repress, MD 01/02/22 [phone]

## 2022-01-01 NOTE — Discharge Instructions (Signed)
Your workup was reassuring today that you do not have a bony injury or dislocation to your knee.  We recommend that you use the provided Ace wrap and crutches as needed, but you can continue to bear weight as tolerated.  Read through the included information about routine care for injuries.  Follow up as recommended if you are still having problems in about a week.   

## 2022-03-28 ENCOUNTER — Ambulatory Visit (HOSPITAL_BASED_OUTPATIENT_CLINIC_OR_DEPARTMENT_OTHER): Payer: Medicaid Other | Admitting: Psychiatry

## 2022-03-28 DIAGNOSIS — F4323 Adjustment disorder with mixed anxiety and depressed mood: Secondary | ICD-10-CM | POA: Diagnosis not present

## 2022-03-28 DIAGNOSIS — F4321 Adjustment disorder with depressed mood: Secondary | ICD-10-CM | POA: Diagnosis not present

## 2022-03-28 MED ORDER — CLONAZEPAM 0.5 MG PO TABS: ORAL_TABLET | ORAL | 5 refills | Status: AC

## 2022-03-28 NOTE — Progress Notes (Signed)
[facility] MD Progress Note  03/28/2022 1:41 PM Kevin Hall  MRN:  [id number] Subjective:  No real change doing okay Principal Problem: Adjustment disorder with anxious mood state        Today the patient is doing well.  He is at his baseline.  His mood is good.  In close evaluation however there are weekends where he drinks a lot.  Some weekends will have a pint of whiskey and sometimes will have a 6 pack.  He says he takes a holiday because he does not take his Klonopin when he drinks.  He does not drink like that every weekend but on certain weekends he will drink a lot.  He never drinks and drives.  This drinking pattern has generally been like this for years.  The patient is still playing his guitar.  He got rid of this parakeet.  The patient does gardening.  He grew his father plans.  Patient's mood is stable.  He is sleeping and eating fairly well.  He has got good energy.  He does not use any drugs.  He likes to call.  The patient spends a lot of alone time.  His son [name] is doing well at age 44.  His son is being treated in this center and takes Strattera.  Generally his son is a good kid gets good grades.  The patient also lives with his 37 year old mother. Past Medical History:  Diagnosis Date   Achilles tendinitis    Anxiety    Back pain    Depression    Hypercholesteremia    Shoulder dislocation     Past Surgical History:  Procedure Laterality Date   HERNIA REPAIR     KNEE ARTHROSCOPY     KNEE ARTHROSCOPY Left 07/10/2017   TONSILLECTOMY AND ADENOIDECTOMY     Family History:  Family History  Problem Relation Age of Onset   Anxiety disorder Mother    Depression Mother    Diabetes Mother    Hypertension Mother    Thyroid disease Mother    Cancer - Lung Father    Alcohol abuse Father    Cancer Father    Bipolar disorder Brother    Anxiety disorder Sister    Family Psychiatric  History:  Social History:  Social History   Substance and Sexual Activity  Alcohol  Use No   Comment: a few beers every month      Social History   Substance and Sexual Activity  Drug Use No   Types: Marijuana, LSD   Comment: Drug use twenty years ago     Social History   Socioeconomic History   Marital status: Divorced    Spouse name: Not on file   Number of children: Not on file   Years of education: Not on file   Highest education level: Not on file  Occupational History   Not on file  Tobacco Use   Smoking status: Former    Packs/day: 1.00    Years: 31.00    Total pack years: 31.00    Types: Cigarettes    Quit date: 09/26/2017    Years since quitting: 4.5   Smokeless tobacco: Never  Vaping Use   Vaping Use: Never used  Substance and Sexual Activity   Alcohol use: No    Comment: a few beers every month    Drug use: No    Types: Marijuana, LSD    Comment: Drug use twenty years ago    Sexual activity: Not Currently  Other Topics Concern   Not on file  Social History Narrative   Not on file   Social Determinants of Health   Financial Resource Strain: Not on file  Food Insecurity: Not on file  Transportation Needs: Not on file  Physical Activity: Not on file  Stress: Not on file  Social Connections: Not on file   Additional Social History:                         Sleep: Good  Appetite:  Good  Current Medications: Current Outpatient Medications  Medication Sig Dispense Refill   atorvastatin (LIPITOR) 40 MG tablet Take 40 mg by mouth daily.  5   clonazePAM (KLONOPIN) 0.5 MG tablet 1 bid 60 tablet 5   gabapentin (NEURONTIN) 600 MG tablet Take 600 mg by mouth 3 (three) times daily.     methocarbamol (ROBAXIN) 500 MG tablet Take 500 mg by mouth 3 (three) times daily.     OxyCODONE HCl, Abuse Deter, (OXAYDO) 5 MG TABA Take 5 mg by mouth every 4 (four) hours.     ranitidine (RANITIDINE 150 MAX STRENGTH) 150 MG tablet Take 150 mg by mouth 2 (two) times daily.     No current facility-administered medications for this visit.     Lab Results: No results found for this or any previous visit (from the past 48 hour(s)).  Blood Alcohol level:  No results found for: "ETH"  Physical Findings: AIMS:  , ,  ,  ,    CIWA:    COWS:     Musculoskeletal: Strength & Muscle Tone: within normal limits Gait & Station: normal Patient leans: N/A  Psychiatric Specialty Exam: ROS  There were no vitals taken for this visit.There is no height or weight on file to calculate BMI.  General Appearance: Casual  Eye Contact::  Fair  Speech:  Clear and Coherent  Volume:  Normal  Mood:  Dysphoric  Affect:  Congruent  Thought Process:  Coherent  Orientation:  Full (Time, Place, and Person)  Thought Content:  WDL  Suicidal Thoughts:  No  Homicidal Thoughts:  No  Memory:  NA  Judgement:  Fair  Insight:  Fair  Psychomotor Activity:  Normal  Concentration:  Fair  Recall:  Good  Fund of Knowledge:Fair  Language: Good   Akathisia:  No  Handed:  Right  AIMS (if indicated):     Assets:  Communication Skills  ADL's:  Intact  Cognition: WNL  Sleep:      Treatment Plan Summary    This patient's diagnosis is adjustment disorder with anxious mood state.  It is fairly well controlled with Klonopin 0.5 mg twice daily.  I think he also has substance use disorder but it is more like episodic drinking.  He has not had any consequences from it at this time.  Patient is to be a dedicated father.  Return to see me in 5 months.  He is functioning actually quite well.

## 2022-07-26 ENCOUNTER — Telehealth (HOSPITAL_COMMUNITY): Payer: Self-pay | Admitting: Psychiatry

## 2022-07-26 NOTE — Telephone Encounter (Signed)
Patient called in to cancel his upcoming appointment and let you know that he will be getting his medication from his PCP from now on. He said it will be less visits for him which is what he is wanting. He wanted to thank you fr all that you've done.

## 2022-08-29 ENCOUNTER — Ambulatory Visit (HOSPITAL_COMMUNITY): Payer: Medicaid Other | Admitting: Psychiatry

## 2023-12-17 ENCOUNTER — Other Ambulatory Visit: Payer: Self-pay

## 2023-12-17 ENCOUNTER — Encounter (HOSPITAL_BASED_OUTPATIENT_CLINIC_OR_DEPARTMENT_OTHER): Payer: Self-pay

## 2023-12-17 ENCOUNTER — Emergency Department (HOSPITAL_BASED_OUTPATIENT_CLINIC_OR_DEPARTMENT_OTHER)

## 2023-12-17 ENCOUNTER — Emergency Department (HOSPITAL_BASED_OUTPATIENT_CLINIC_OR_DEPARTMENT_OTHER)
Admission: EM | Admit: 2023-12-17 | Discharge: 2023-12-17 | Disposition: A | Discharge status: Home / Self Care | Attending: Emergency Medicine | Admitting: Emergency Medicine

## 2023-12-17 DIAGNOSIS — R1032 Left lower quadrant pain: Secondary | ICD-10-CM | POA: Diagnosis present

## 2023-12-17 DIAGNOSIS — K4090 Unilateral inguinal hernia, without obstruction or gangrene, not specified as recurrent: Secondary | ICD-10-CM | POA: Insufficient documentation

## 2023-12-17 DIAGNOSIS — Z87891 Personal history of nicotine dependence: Secondary | ICD-10-CM | POA: Diagnosis not present

## 2023-12-17 LAB — CBC WITH DIFFERENTIAL/PLATELET
Abs Immature Granulocytes: 0.03 10*3/uL (ref 0.00–0.07)
Basophils Absolute: 0.1 10*3/uL (ref 0.0–0.1)
Basophils Relative: 1 %
Eosinophils Absolute: 0.1 10*3/uL (ref 0.0–0.5)
Eosinophils Relative: 1 %
HCT: 39.9 % (ref 39.0–52.0)
Hemoglobin: 13.7 g/dL (ref 13.0–17.0)
Immature Granulocytes: 0 %
Lymphocytes Relative: 16 %
Lymphs Abs: 1.2 10*3/uL (ref 0.7–4.0)
MCH: 30.6 pg (ref 26.0–34.0)
MCHC: 34.3 g/dL (ref 30.0–36.0)
MCV: 89.1 fL (ref 80.0–100.0)
Monocytes Absolute: 0.5 10*3/uL (ref 0.1–1.0)
Monocytes Relative: 7 %
Neutro Abs: 6 10*3/uL (ref 1.7–7.7)
Neutrophils Relative %: 75 %
Platelets: 320 10*3/uL (ref 150–400)
RBC: 4.48 MIL/uL (ref 4.22–5.81)
RDW: 13.1 % (ref 11.5–15.5)
WBC: 7.9 10*3/uL (ref 4.0–10.5)
nRBC: 0 % (ref 0.0–0.2)

## 2023-12-17 LAB — COMPREHENSIVE METABOLIC PANEL WITH GFR
ALT: 30 U/L (ref 0–44)
AST: 27 U/L (ref 15–41)
Albumin: 4.3 g/dL (ref 3.5–5.0)
Alkaline Phosphatase: 45 U/L (ref 38–126)
Anion gap: 15 (ref 5–15)
BUN: 23 mg/dL — ABNORMAL HIGH (ref 6–20)
CO2: 21 mmol/L — ABNORMAL LOW (ref 22–32)
Calcium: 9.5 mg/dL (ref 8.9–10.3)
Chloride: 103 mmol/L (ref 98–111)
Creatinine, Ser: 0.87 mg/dL (ref 0.61–1.24)
GFR, Estimated: >60 mL/min (ref >60)
Glucose, Bld: 78 mg/dL (ref 70–99)
Potassium: 3.9 mmol/L (ref 3.5–5.1)
Sodium: 139 mmol/L (ref 135–145)
Total Bilirubin: 0.5 mg/dL (ref 0.0–1.2)
Total Protein: 6.7 g/dL (ref 6.5–8.1)

## 2023-12-17 LAB — LACTIC ACID, PLASMA: Lactic Acid, Venous: 0.8 mmol/L (ref 0.5–1.9)

## 2023-12-17 MED ORDER — IOHEXOL 300 MG/ML  SOLN
100.0000 mL | Freq: Once | INTRAMUSCULAR | Status: AC | PRN
  Administered 2023-12-17: 100 mL via INTRAVENOUS

## 2023-12-17 MED ORDER — HYDROMORPHONE HCL 1 MG/ML IJ SOLN
0.5000 mg | Freq: Once | INTRAMUSCULAR | Status: AC
  Administered 2023-12-17: 0.5 mg via INTRAVENOUS
  Filled 2023-12-17: qty 1

## 2023-12-17 MED ORDER — OXYCODONE HCL 5 MG PO TABS: 5.0000 mg | ORAL_TABLET | Freq: Four times a day (QID) | ORAL | 0 refills | Status: AC | PRN

## 2023-12-17 NOTE — ED Provider Notes (Signed)
 [place] EMERGENCY DEPARTMENT AT [facility] Provider Note  CSN: [id number] Arrival date & time: 12/17/23 1747  Chief Complaint(s) No chief complaint on file.  HPI Kevin Hall is a 53 y.o. male without significant past medical history presenting to the emergency department with abdominal pain.  Patient reports left lower quadrant/groin pain starting today.  Reports worse with movement.  Reports feeling constipation but no nausea or vomiting.  Denies similar episode previously.  No painful urination or testicular pain.   Past Medical History Past Medical History:  Diagnosis Date   Achilles tendinitis    Anxiety    Back pain    Depression    Hypercholesteremia    Shoulder dislocation    Patient Active Problem List   Diagnosis Date Noted   Acute pain of left knee 07/18/2016   Generalized anxiety disorder 02/08/2016   Achilles tendon disorder 10/14/2014   Low back pain 01/09/2014   HLD (hyperlipidemia) 01/09/2014   Health care maintenance 12/29/2013   Tobacco abuse 12/29/2013   Sciatica 12/29/2013   Adjustment disorder with mixed anxiety and depressed mood 11/06/2012   Attention deficit disorder (ADD), child, 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 Authorizing Provider  oxyCODONE  (ROXICODONE ) 5 MG immediate release tablet Take 1 tablet (5 mg total) by mouth every 6 (six) hours as needed for severe pain (pain score 7-10). 12/17/23  Yes Francesca Elsie CROME, MD  atorvastatin  (LIPITOR) 40 MG tablet Take 40 mg by mouth daily. 07/13/16   [provider]  clonazePAM  (KLONOPIN ) 0.5 MG tablet 1 bid 03/28/22   Plovsky, Elna, MD  gabapentin (NEURONTIN) 600 MG tablet Take 600 mg by mouth 3 (three) times daily.    [provider]  methocarbamol  (ROBAXIN ) 500 MG tablet Take 500 mg by mouth 3 (three) times daily.    [provider]  ranitidine (RANITIDINE 150 MAX  STRENGTH) 150 MG tablet Take 150 mg by mouth 2 (two) times daily.    [provider]                                                                                                                                    Past Surgical History Past Surgical History:  Procedure Laterality Date   HERNIA REPAIR     KNEE ARTHROSCOPY     KNEE ARTHROSCOPY Left 07/10/2017   TONSILLECTOMY AND ADENOIDECTOMY     Family History Family History  Problem Relation Age of Onset   Anxiety disorder Mother    Depression Mother    Diabetes Mother    Hypertension Mother    Thyroid disease Mother    Cancer - Lung Father    Alcohol abuse Father    Cancer Father    Bipolar disorder Brother    Anxiety disorder Sister     Social History Social History   Tobacco Use  Smoking status: Former    Current packs/day: 0.00    Average packs/day: 1 pack/day for 31.0 years (31.0 ttl pk-yrs)    Types: Cigarettes    Start date: 09/27/1986    Quit date: 09/26/2017    Years since quitting: 6.2   Smokeless tobacco: Never  Vaping Use   Vaping status: Never Used  Substance Use Topics   Alcohol use: No    Comment: a few beers every month    Drug use: No    Types: Marijuana, LSD    Comment: Drug use twenty years ago    Allergies Patient has no known allergies.  Review of Systems Review of Systems  All other systems reviewed and are negative.   Physical Exam Vital Signs  I have reviewed the triage vital signs BP 117/85   Pulse 84   Temp 97.8 F (36.6 C)   Resp 18   SpO2 95%  Physical Exam Vitals and nursing note reviewed.  Constitutional:      General: He is not in acute distress.    Appearance: Normal appearance.  HENT:     Mouth/Throat:     Mouth: Mucous membranes are moist.   Eyes:     Conjunctiva/sclera: Conjunctivae normal.    Cardiovascular:     Rate and Rhythm: Normal rate and regular rhythm.  Pulmonary:     Effort: Pulmonary effort is normal. No respiratory distress.      Breath sounds: Normal breath sounds.  Abdominal:     General: Abdomen is flat.     Palpations: Abdomen is soft.     Tenderness: There is no abdominal tenderness.  Genitourinary:    Comments: Fullness in L inguinal canal without overlying skin changes. GU exam otherwise normal   Musculoskeletal:     Right lower leg: No edema.     Left lower leg: No edema.   Skin:    General: Skin is warm and dry.     Capillary Refill: Capillary refill takes less than 2 seconds.   Neurological:     Mental Status: He is alert and oriented to person, place, and time. Mental status is at baseline.   Psychiatric:        Mood and Affect: Mood normal.        Behavior: Behavior normal.     ED Results and Treatments Labs (all labs ordered are listed, but only abnormal results are displayed) Labs Reviewed  COMPREHENSIVE METABOLIC PANEL WITH GFR - Abnormal; Notable for the following components:      Result Value   CO2 21 (*)    BUN 23 (*)    All other components within normal limits  CBC WITH DIFFERENTIAL/PLATELET  LACTIC ACID, PLASMA                                                                                                                          Radiology CT ABDOMEN PELVIS W CONTRAST Result Date: 12/17/2023 CLINICAL DATA:  Bowel obstruction suspected  hernia. Periumbilical pain, bulge on left side. EXAM: CT ABDOMEN AND PELVIS WITH CONTRAST TECHNIQUE: Multidetector CT imaging of the abdomen and pelvis was performed using the standard protocol following bolus administration of intravenous contrast. RADIATION DOSE REDUCTION: This exam was performed according to the departmental dose-optimization program which includes automated exposure control, adjustment of the mA and/or kV according to patient size and/or use of iterative reconstruction technique. CONTRAST:  OMNIPAQUE  IOHEXOL  300 MG/ML  SOLN COMPARISON:  11/14/2014 FINDINGS: Lower chest: No acute abnormality Hepatobiliary: No focal hepatic  abnormality. Gallbladder unremarkable. Pancreas: No focal abnormality or ductal dilatation. Spleen: No focal abnormality.  Normal size. Adrenals/Urinary Tract: Bilateral renal parapelvic cysts. No stones or hydronephrosis. No suspicious renal or adrenal abnormality. Urinary bladder unremarkable. Stomach/Bowel: Normal appendix. Sigmoid diverticulosis. No active diverticulitis. Stomach and small bowel decompressed, unremarkable. No bowel obstruction or inflammatory process. Vascular/Lymphatic: No evidence of aneurysm or adenopathy. Reproductive: No visible focal abnormality. Other: No free fluid or free air. Musculoskeletal: Left inguinal hernia containing fat. No acute bony abnormality. IMPRESSION: Left inguinal hernia containing fat. Sigmoid diverticulosis.  No active diverticulitis. Electronically Signed   By: Franky Crease M.D.   On: 12/17/2023 22:33    Pertinent labs & imaging results that were available during my care of the patient were reviewed by me and considered in my medical decision making (see MDM for details).  Medications Ordered in ED Medications  HYDROmorphone  (DILAUDID ) injection 0.5 mg (0.5 mg Intravenous Given 12/17/23 2123)  iohexol  (OMNIPAQUE ) 300 MG/ML solution 100 mL (100 mLs Intravenous Contrast Given 12/17/23 2212)                                                                                                                                     Procedures Procedures  (including critical care time)  Medical Decision Making / ED Course   MDM:  53 year old presenting to the emergency department with groin pain.  On exam, patient has fullness in inguinal canal suggestive of possible hernia.  Does have tenderness in this region.  No nausea or vomiting, lower concern for obstruction, but given acute onset will obtain CT scan.  Will reassess.  Will give pain medication  Clinical Course as of 12/18/23 1350  Mon Dec 17, 2023  2300 CT scan shows fat containing hernia. No signs  of obstruction or strangulation. Discussed follow up with general surgery/PMD if he desires to follow up with [facility]. Provided information for [facility]. Will discharge patient to home. All questions answered. Patient comfortable with plan of discharge. Return precautions discussed with patient and specified on the after visit summary.  [WS]    Clinical Course User Index [WS] Francesca Elsie CROME, MD     Additional history obtained: -Additional history obtained from ems -External records from outside source obtained and reviewed including: Chart review including previous notes, labs, imaging, consultation notes including prior notes    Lab Tests: -I ordered, reviewed,  and interpreted labs.   The pertinent results include:   Labs Reviewed  COMPREHENSIVE METABOLIC PANEL WITH GFR - Abnormal; Notable for the following components:      Result Value   CO2 21 (*)    BUN 23 (*)    All other components within normal limits  CBC WITH DIFFERENTIAL/PLATELET  LACTIC ACID, PLASMA    Notable for no AKI, normal lactate      Imaging Studies ordered: I ordered imaging studies including CT scan On my interpretation imaging demonstrates fat containing hernia I independently visualized and interpreted imaging. I agree with the radiologist interpretation   Medicines ordered and prescription drug management: Meds ordered this encounter  Medications   HYDROmorphone  (DILAUDID ) injection 0.5 mg   iohexol  (OMNIPAQUE ) 300 MG/ML solution 100 mL   oxyCODONE  (ROXICODONE ) 5 MG immediate release tablet    Sig: Take 1 tablet (5 mg total) by mouth every 6 (six) hours as needed for severe pain (pain score 7-10).    Dispense:  8 tablet    Refill:  0    -I have reviewed the patients home medicines and have made adjustments as needed    Reevaluation: After the interventions noted above, I reevaluated the patient and found that their symptoms have improved  Co morbidities that complicate the  patient evaluation  Past Medical History:  Diagnosis Date   Achilles tendinitis    Anxiety    Back pain    Depression    Hypercholesteremia    Shoulder dislocation       Dispostion: Disposition decision including need for hospitalization was considered, and patient discharged from emergency department.    Final Clinical Impression(s) / ED Diagnoses Final diagnoses:  Left inguinal hernia     This chart was dictated using voice recognition software.  Despite best efforts to proofread,  errors can occur which can change the documentation meaning.    Francesca Elsie CROME, MD 12/18/23 1350

## 2023-12-17 NOTE — ED Notes (Signed)
Reviewed discharge instructions, medications, and home care with pt. Pt verbalized understanding and had no further questions. Pt exited ED without complications.

## 2023-12-17 NOTE — ED Notes (Signed)
 Patient transported to CT

## 2023-12-17 NOTE — ED Triage Notes (Signed)
 Pt via [facility] eval of sudden-onset periumbilical pain 9a, bulge in L side. Painful to touch. Denies NV, states he is uncomfortable all the time, burns like somebody is pinching me.  Hx hernia as a teenager

## 2023-12-17 NOTE — Discharge Instructions (Addendum)
 We evaluated you for your groin pain.  Your CT scan shows a left inguinal hernia.  There was no sign of any intestine inside of your hernia.  Please follow-up with your primary doctor if you would like to follow-up with the surgeon within [facility].  If you would like to follow-up with a surgeon at [facility], you can [facility] with [facility].  Please take Tylenol  (acetaminophen ) and Motrin  (ibuprofen ) for your symptoms at home.  You can take 1000 mg of Tylenol  every 6 hours and 600 mg of Motrin  every 6 hours as needed for your symptoms.  You can take these medicines together as needed, either at the same time, or alternating every 3 hours.  We have prescribed you a small amount of oxycodone  which you can take if needed for uncontrolled pain.  Please do not mix with alcohol or drive when taking this medication  Please look up a hernia truss on the Internet.  You can buy this to help support your hernia.  The most dangerous problem that can occur with the hernia is if you have intestine that is trapped in the hernia.  If you develop any increasing pain, vomiting, redness or increased swelling in your groin, severe nausea, or any other concerning symptoms, please return to the emergency department for recheck.

## 2024-01-22 NOTE — Progress Notes (Signed)
 Patient: Kevin Hall  DOB: September 19, 1970, age 53 y.o. MRN: [id number]  PCP: Lauraine Patient, PA  Assessment and Plan   1. Generalized anxiety disorder  clonazePAM  (KLONOPIN ) 0.5 mg tablet    2. Mild episode of recurrent major depressive disorder      3. Hyperlipidemia associated with type 2 diabetes mellitus (*)  Lipid Panel With LDL/HDL Ratio    4. Type 2 diabetes mellitus without complication, without long-term current use of insulin (*)  CBC And Differential   Comprehensive Metabolic Panel   POCT A1C    5. Vitamin D deficiency  Vitamin D 25 Hydroxy      Assessment & Plan 1. Diabetes - Diabetes is well-managed with diet control. - Continues taking metformin once a day. - Blood glucose levels are stable. - No diabetic medications other than metformin are currently needed.  2. Elevated LDL - LDL levels are elevated, possibly due to recent steroid injections. - LDL levels will be rechecked in 3 months. - Continues taking atorvastatin . - Triglycerides are within normal range.  3. Hernia - Fat hernia is unlikely to heal but may not worsen. - Advised to avoid activities that isolate the abdominal muscles. - Continue stretching exercises for the back. - CT scan will be obtained later this week.  4. Anxiety - Currently taking clonazepam  three times a day. - Advised to try reducing the afternoon dose of clonazepam  by half. - If symptoms worsen, dosage will be adjusted accordingly. - Discussed the long-term management of clonazepam  due to its addictive nature.  5. Medication management - Continues taking atorvastatin  and fenofibrate as prescribed. - Vitamin D 5000 units daily, advised to continue this regimen. - Platelet levels slightly elevated due to prednisone  use, not a concern. - Other lab results including kidney function, liver function, and electrolytes are stable.  Follow-up: A follow-up visit is scheduled in 3 months.    Follow up in about 3 months  (around 04/23/2024) for DM, HTN, mental health.   Risks, benefits, and alternatives of the medications and treatment plan prescribed today were discussed, and patient expressed understanding. Answered all questions and addressed all concerns to the patient's satisfaction.  Plan follow-up as discussed or as needed if any worsening symptoms or change in condition.  Patient voiced understanding of the treatment plan and agreed to attempt to comply.  Designer, fashion/clothing may have been used to create parts of the visit note. Consent from the patient/caregiver was obtained prior to its use.  See after visit summary for patient specific instructions.    Subjective   Kevin Hall is a 53 y.o. male who presents with:     Patient presents with  . Diabetes  . Hypertension     Pt presents to clinic for recheck chronic medical problems.  Had labs done last week.  Has had injections in shoulder AC joint - thinks that he needs a joint injection.  DM - takes metformin daily  Vit D 5000U daily  Mental health - stable - takes klonopin  1 early evening and then one later so he can get to sleep - he has been currently taking on in the afternoon due to current increase in anxiety - son is home due to summer vacation so schedule is not consistent  Hernia - has more pain in the evening but feels fine in the am - still worried about it - has not really done any exercise or stretching since he was diagnosed.   ---------------------------------------------------------------- Computer generated note History of  Present Illness The patient presents for evaluation of diabetes, elevated LDL, hernia, and anxiety.  He has been managing his diabetes through dietary changes, including switching to black coffee since 10/27/2023. He is currently on metformin once daily and fenofibrate for triglycerides. He reports that his diabetes is well-controlled.  He reports an increase in his LDL levels, which he attributes to  two steroid injections he received in his left shoulder 20 hours prior to the blood test. He has been consistent with his atorvastatin  medication. He has been unable to exercise due to a hernia, which he believes has contributed to his elevated LDL levels.  He was diagnosed with a hernia on 12/17/2023 during an emergency room visit. He experiences gas and fatigue in the evening. He has not been performing stretches or sit-ups but has been doing wall planks and swinging his leg back and forth while holding onto a doorway. He is requesting a work note for lifting restrictions.  He is currently taking clonazepam  three times a day, which helps him rest when he is in pain. He takes one dose in the afternoon and two at night, usually within a 4 to 6-hour interval. He has previously tried Ambien and Valium .  Social History: Diet: The patient drinks black coffee. Coffee/Tea/Caffeine-containing Drinks: The patient drinks black coffee.  PAST SURGICAL HISTORY: He had two steroid injections in his left shoulder 20 hours before the blood tests. He had an eye doctor appointment in 09/2023.  History was obtained from patient.   Reviewed and updated this visit by provider: Tobacco  Allergies  Meds  Problems  Med Hx  Surg Hx  Fam Hx        Review of Systems  Constitutional:  Negative for diaphoresis.  Eyes:  Negative for visual disturbance.  Respiratory:  Negative for shortness of breath.   Cardiovascular:  Negative for chest pain, palpitations and leg swelling.  Endocrine: Negative for polydipsia and polyuria.  Skin:  Negative for wound.  Neurological:  Negative for dizziness, light-headedness, numbness (or paresthesias of feet) and headaches.  Psychiatric/Behavioral:  Positive for sleep disturbance. The patient is nervous/anxious.         Objective   Vitals:   01/22/24 0940  BP: 108/74  Patient Position: Sitting  Pulse: 82  Temp: 97 F (36.1 C)  TempSrc: Temporal  Resp: 17  Height:  5' 8.5 (1.74 m)  Weight: 174 lb 6.4 oz (79.1 kg)  SpO2: 96%  BMI (Calculated): 26.1    Physical Exam Vitals and nursing note reviewed.  Constitutional:      Appearance: Normal appearance.  HENT:     Head: Normocephalic and atraumatic.     Right Ear: External ear normal.     Left Ear: External ear normal.     Nose: Nose normal.  Eyes:     Conjunctiva/sclera: Conjunctivae normal.  Musculoskeletal:     Cervical back: Normal range of motion.  Pulmonary:     Effort: Pulmonary effort is normal.  Skin:    General: Skin is warm and dry.  Neurological:     Mental Status: He is alert and oriented to person, place, and time.     Gait: Gait normal.  Psychiatric:        Mood and Affect: Mood normal.        Behavior: Behavior normal.        Thought Content: Thought content normal.        Judgment: Judgment normal.        Lauraine  Allen [facility] [employer]  01/22/2024

## 2024-03-12 ENCOUNTER — Ambulatory Visit: Payer: Self-pay | Admitting: Surgery

## 2024-03-12 NOTE — Progress Notes (Signed)
 REFERRING PHYSICIAN:  Allen Lauraine Kay, PA PROVIDER:  LEONOR MACARIO DAWN, MD MRN: [id number] DOB: 04-02-1971 DATE OF ENCOUNTER: 03/12/2024 Subjective    Chief Complaint: New Consultation (left ing hernia.)   History of Present Illness: Kevin Hall is a 53 y.o. male who is seen today as an office consultation for evaluation of New Consultation (left ing hernia.)  He has noticed a bulge in the left groin for the last few months. It occurs primarily when he is doing work around the house. It reduces when he is lying down at night. He was seen in the ED in June with left groin pain, and a CT scan confirmed a left inguinal hernia which contained fat but no bowel. He has not had obstructive symptoms. He was referred to discuss repair.  He is a former smoker and quit in 2019. His only prior abdominal surgery is a right inguinal hernia repair as a teenager. He has a teenage son and also takes care of his mother who is 70, and is concerned about having to undergo a surgery.     Review of Systems: A complete review of systems was obtained from the patient.  I have reviewed this information and discussed as appropriate with the patient.  See HPI as well for other ROS.  Medical History: Past Medical History:  Diagnosis Date  . Achilles tendinitis   . Anxiety   . Back pain   . Depression   . Hypercholesteremia   . Shoulder dislocation     There is no problem list on file for this patient.   Past Surgical History:  Procedure Laterality Date  . KNEE ARTHROSCOPY Left 07/10/2017  . HERNIA REPAIR    . KNEE ARTHROSCOPY    . TONSILLECTOMY AND ADENOIDECTOMY       Allergies  Allergen Reactions  . Gabapentin Swelling  . Latex, Natural Rubber Other (See Comments)    Current Outpatient Medications on File Prior to Visit  Medication Sig Dispense Refill  . atorvastatin  (LIPITOR) 40 MG tablet Take 40 mg by mouth once daily    . clonazePAM  (KLONOPIN ) 0.5 MG tablet TAKE ONE TABLET (0.5 MG  DOSE) BY MOUTH 2 (TWO) TIMES DAILY. MAX DAILY AMOUNT: 1 MG    . methocarbamoL  (ROBAXIN ) 750 MG tablet Take 750 mg by mouth 3 (three) times daily     No current facility-administered medications on file prior to visit.    Family History  Problem Relation Age of Onset  . Other (Anxiety Disorder) Mother   . Depression Mother   . Diabetes Mother   . High blood pressure (Hypertension) Mother   . Thyroid disease Mother   . Lung cancer Father   . Alcohol abuse Father   . Other (Anxiety Disorder) Sister   . Bipolar disorder Brother      Social History   Tobacco Use  Smoking Status Former  . Types: Cigarettes  Smokeless Tobacco Never     Social History   Socioeconomic History  . Marital status: Divorced  Tobacco Use  . Smoking status: Former    Types: Cigarettes  . Smokeless tobacco: Never  Substance and Sexual Activity  . Alcohol use: Never  . Drug use: Never  . Sexual activity: Defer   Social Drivers of Health   Financial Resource Strain: Low Risk  (11/28/2023)   Received from [facility]   Overall Financial Resource Strain (CARDIA)   . Difficulty of Paying Living Expenses: Not hard at all  Food Insecurity: No  Food Insecurity (11/28/2023)   [facility]   Hunger Vital Sign   . Within the past 12 months, you worried that your food would run out before you got the money to buy more.: Never true   . Within the past 12 months, the food you bought just didn't last and you didn't have money to get more.: Never true  Transportation Needs: No Transportation Needs (11/28/2023)   Received from [facility] - Transportation   . Lack of Transportation (Medical): No   . Lack of Transportation (Non-Medical): No  Physical Activity: Insufficiently Active (10/18/2023)   Received from [facility]   Exercise Vital Sign   . On average, how many days per week do you engage in moderate to strenuous exercise (like a brisk walk)?: 7 days   . On average, how many  minutes do you engage in exercise at this level?: 20 min  Stress: Stress Concern Present (10/18/2023)   Received from [facility] - Occupational Stress Questionnaire   . Feeling of Stress : To some extent  Social Connections: Moderately Integrated (10/18/2023)   Received from [facility]   Social Network   . How would you rate your social network (family, work, friends)?: Adequate participation with social networks  Housing Stability: Low Risk  (11/28/2023)   Received from [facility] Stability Vital Sign   . In the last 12 months, was there a time when you were not able to pay the mortgage or rent on time?: No   . In the past 12 months, how many times have you moved where you were living?: 1   . At any time in the past 12 months, were you homeless or living in a shelter (including now)?: No    Objective:   Vitals:   03/12/24 0851  BP: 113/74  Pulse: 91  Temp: 36.4 C (97.6 F)  TempSrc: Temporal  SpO2: 98%  Weight: 78.7 kg (173 lb 6.4 oz)  Height: 175.3 cm (5' 9)  PainSc: 0-No pain    Body mass index is 25.61 kg/m.  Physical Exam Vitals reviewed.  Constitutional:      General: He is not in acute distress.    Appearance: Normal appearance.  HENT:     Head: Normocephalic and atraumatic.  Eyes:     General: No scleral icterus.    Conjunctiva/sclera: Conjunctivae normal.  Cardiovascular:     Rate and Rhythm: Normal rate and regular rhythm.  Pulmonary:     Effort: Pulmonary effort is normal. No respiratory distress.     Breath sounds: Normal breath sounds. No wheezing.  Abdominal:     General: There is no distension.     Palpations: Abdomen is soft.     Tenderness: There is no abdominal tenderness.  Genitourinary:    Comments: Left inguinal hernia palpable with valsalva. No evidence of right inguinal hernia. Skin:    General: Skin is warm and dry.  Neurological:     General: No focal deficit present.      Mental Status: He is alert and oriented to person, place, and time.        Assessment and Plan:     Diagnoses and all orders for this visit:  Left inguinal hernia    53 yo male with a reducible but symptomatic left inguinal hernia. I reviewed the details of a minimally invasive hernia repair with mesh, including the benefits and the risks  of bleeding, infection, and chronic inguinodynia. I also reviewed the postoperative recovery course and restrictions. He expressed understanding. 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 We did discuss observation, however I emphasized the symptoms will likely not improve. I also counseled on the risk of bowel incarceration. He would like to proceed with surgery but will discuss timing with his family, and will call when he is ready to schedule. All questions were answered.    SHELBY LYNN ALLEN, MD
